# Patient Record
Sex: Male | Born: 1935 | Race: White | Hispanic: No | Marital: Married
Health system: Southern US, Community
[De-identification: ages and names within clinical notes are randomized; demographics above are authoritative.]

## PROBLEM LIST (undated history)

## (undated) DIAGNOSIS — I10 Essential (primary) hypertension: Secondary | ICD-10-CM

## (undated) DIAGNOSIS — E559 Vitamin D deficiency, unspecified: Secondary | ICD-10-CM

## (undated) DIAGNOSIS — Z8673 Personal history of transient ischemic attack (TIA), and cerebral infarction without residual deficits: Secondary | ICD-10-CM

## (undated) DIAGNOSIS — A419 Sepsis, unspecified organism: Secondary | ICD-10-CM

## (undated) DIAGNOSIS — J449 Chronic obstructive pulmonary disease, unspecified: Secondary | ICD-10-CM

## (undated) DIAGNOSIS — R7303 Prediabetes: Secondary | ICD-10-CM

## (undated) DIAGNOSIS — M1990 Unspecified osteoarthritis, unspecified site: Secondary | ICD-10-CM

## (undated) DIAGNOSIS — I4891 Unspecified atrial fibrillation: Secondary | ICD-10-CM

## (undated) DIAGNOSIS — I700 Atherosclerosis of aorta: Secondary | ICD-10-CM

## (undated) DIAGNOSIS — M8580 Other specified disorders of bone density and structure, unspecified site: Secondary | ICD-10-CM

## (undated) DIAGNOSIS — I495 Sick sinus syndrome: Secondary | ICD-10-CM

## (undated) DIAGNOSIS — G2581 Restless legs syndrome: Secondary | ICD-10-CM

## (undated) DIAGNOSIS — Z950 Presence of cardiac pacemaker: Secondary | ICD-10-CM

## (undated) DIAGNOSIS — E039 Hypothyroidism, unspecified: Secondary | ICD-10-CM

## (undated) DIAGNOSIS — G4733 Obstructive sleep apnea (adult) (pediatric): Secondary | ICD-10-CM

## (undated) DIAGNOSIS — E785 Hyperlipidemia, unspecified: Secondary | ICD-10-CM

## (undated) DIAGNOSIS — C44310 Basal cell carcinoma of skin of unspecified parts of face: Secondary | ICD-10-CM

## (undated) DIAGNOSIS — J189 Pneumonia, unspecified organism: Secondary | ICD-10-CM

## (undated) DIAGNOSIS — Z9989 Dependence on other enabling machines and devices: Secondary | ICD-10-CM

## (undated) DIAGNOSIS — J329 Chronic sinusitis, unspecified: Secondary | ICD-10-CM

## (undated) HISTORY — DX: Atherosclerosis of aorta: I70.0

## (undated) HISTORY — PX: TRANSURETHRAL RESECTION OF PROSTATE: SHX73

## (undated) HISTORY — DX: Essential (primary) hypertension: I10

## (undated) HISTORY — DX: Restless legs syndrome: G25.81

## (undated) HISTORY — DX: Other specified disorders of bone density and structure, unspecified site: M85.80

## (undated) HISTORY — DX: Sick sinus syndrome: I49.5

## (undated) HISTORY — PX: BASAL CELL CARCINOMA EXCISION: SHX1214

## (undated) HISTORY — DX: Unspecified osteoarthritis, unspecified site: M19.90

## (undated) HISTORY — DX: Prediabetes: R73.03

## (undated) HISTORY — DX: Vitamin D deficiency, unspecified: E55.9

## (undated) HISTORY — DX: Presence of cardiac pacemaker: Z95.0

## (undated) HISTORY — DX: Chronic obstructive pulmonary disease, unspecified: J44.9

## (undated) HISTORY — DX: Hyperlipidemia, unspecified: E78.5

---

## 1999-07-09 ENCOUNTER — Encounter: Payer: Self-pay | Admitting: Emergency Medicine

## 1999-07-09 ENCOUNTER — Inpatient Hospital Stay (HOSPITAL_COMMUNITY): Admission: EM | Admit: 1999-07-09 | Discharge: 1999-07-11 | Payer: Self-pay | Admitting: Emergency Medicine

## 2000-03-18 ENCOUNTER — Other Ambulatory Visit: Admission: RE | Admit: 2000-03-18 | Discharge: 2000-03-18 | Payer: Self-pay | Admitting: Urology

## 2000-11-02 ENCOUNTER — Encounter: Admission: RE | Admit: 2000-11-02 | Discharge: 2000-11-02 | Payer: Self-pay | Admitting: Urology

## 2000-11-02 ENCOUNTER — Encounter: Payer: Self-pay | Admitting: Urology

## 2000-12-10 ENCOUNTER — Encounter: Payer: Self-pay | Admitting: Urology

## 2000-12-10 ENCOUNTER — Encounter: Admission: RE | Admit: 2000-12-10 | Discharge: 2000-12-10 | Payer: Self-pay | Admitting: Urology

## 2001-03-24 ENCOUNTER — Other Ambulatory Visit: Admission: RE | Admit: 2001-03-24 | Discharge: 2001-03-24 | Payer: Self-pay | Admitting: Urology

## 2001-03-24 ENCOUNTER — Encounter (INDEPENDENT_AMBULATORY_CARE_PROVIDER_SITE_OTHER): Payer: Self-pay | Admitting: Specialist

## 2001-06-28 ENCOUNTER — Encounter: Payer: Self-pay | Admitting: Internal Medicine

## 2001-06-28 ENCOUNTER — Encounter: Admission: RE | Admit: 2001-06-28 | Discharge: 2001-06-28 | Payer: Self-pay | Admitting: Internal Medicine

## 2001-12-26 ENCOUNTER — Encounter: Admission: RE | Admit: 2001-12-26 | Discharge: 2001-12-26 | Payer: Self-pay | Admitting: Internal Medicine

## 2001-12-26 ENCOUNTER — Encounter: Payer: Self-pay | Admitting: Internal Medicine

## 2002-07-18 ENCOUNTER — Encounter: Admission: RE | Admit: 2002-07-18 | Discharge: 2002-07-18 | Payer: Self-pay | Admitting: Internal Medicine

## 2002-07-18 ENCOUNTER — Encounter: Payer: Self-pay | Admitting: Internal Medicine

## 2002-09-08 ENCOUNTER — Encounter: Admission: RE | Admit: 2002-09-08 | Discharge: 2002-09-08 | Payer: Self-pay | Admitting: Internal Medicine

## 2002-09-08 ENCOUNTER — Encounter: Payer: Self-pay | Admitting: Internal Medicine

## 2002-12-16 ENCOUNTER — Encounter: Payer: Self-pay | Admitting: Pulmonary Disease

## 2002-12-16 ENCOUNTER — Ambulatory Visit (HOSPITAL_BASED_OUTPATIENT_CLINIC_OR_DEPARTMENT_OTHER): Admission: RE | Admit: 2002-12-16 | Discharge: 2002-12-16 | Payer: Self-pay | Admitting: Internal Medicine

## 2004-02-22 HISTORY — PX: CARDIAC CATHETERIZATION: SHX172

## 2004-02-28 ENCOUNTER — Ambulatory Visit (HOSPITAL_COMMUNITY): Admission: RE | Admit: 2004-02-28 | Discharge: 2004-02-28 | Payer: Self-pay | Admitting: Cardiology

## 2004-02-28 ENCOUNTER — Encounter: Admission: RE | Admit: 2004-02-28 | Discharge: 2004-02-28 | Payer: Self-pay | Admitting: Cardiology

## 2004-02-28 ENCOUNTER — Encounter (INDEPENDENT_AMBULATORY_CARE_PROVIDER_SITE_OTHER): Payer: Self-pay | Admitting: Cardiology

## 2004-03-06 ENCOUNTER — Inpatient Hospital Stay (HOSPITAL_BASED_OUTPATIENT_CLINIC_OR_DEPARTMENT_OTHER): Admission: RE | Admit: 2004-03-06 | Discharge: 2004-03-06 | Payer: Self-pay | Admitting: Cardiology

## 2004-10-30 ENCOUNTER — Ambulatory Visit: Payer: Self-pay | Admitting: Pulmonary Disease

## 2004-12-25 ENCOUNTER — Ambulatory Visit: Payer: Self-pay | Admitting: Pulmonary Disease

## 2005-04-09 ENCOUNTER — Ambulatory Visit: Payer: Self-pay | Admitting: Pulmonary Disease

## 2005-04-23 HISTORY — PX: INSERT / REPLACE / REMOVE PACEMAKER: SUR710

## 2005-05-16 ENCOUNTER — Ambulatory Visit: Payer: Self-pay | Admitting: Physical Medicine & Rehabilitation

## 2005-05-16 ENCOUNTER — Inpatient Hospital Stay (HOSPITAL_COMMUNITY): Admission: EM | Admit: 2005-05-16 | Discharge: 2005-05-24 | Payer: Self-pay | Admitting: Emergency Medicine

## 2005-05-18 ENCOUNTER — Encounter (INDEPENDENT_AMBULATORY_CARE_PROVIDER_SITE_OTHER): Payer: Self-pay | Admitting: Cardiology

## 2005-05-22 ENCOUNTER — Ambulatory Visit: Payer: Self-pay | Admitting: Internal Medicine

## 2005-05-28 ENCOUNTER — Ambulatory Visit: Payer: Self-pay | Admitting: Internal Medicine

## 2005-06-10 ENCOUNTER — Encounter: Admission: RE | Admit: 2005-06-10 | Discharge: 2005-08-12 | Payer: Self-pay | Admitting: Neurology

## 2005-08-17 ENCOUNTER — Ambulatory Visit: Payer: Self-pay | Admitting: Pulmonary Disease

## 2005-09-11 ENCOUNTER — Ambulatory Visit: Payer: Self-pay | Admitting: Internal Medicine

## 2005-11-18 ENCOUNTER — Ambulatory Visit: Payer: Self-pay | Admitting: Pulmonary Disease

## 2006-03-16 ENCOUNTER — Encounter: Admission: RE | Admit: 2006-03-16 | Discharge: 2006-03-16 | Payer: Self-pay | Admitting: Cardiology

## 2006-04-29 ENCOUNTER — Ambulatory Visit: Payer: Self-pay

## 2006-05-17 ENCOUNTER — Ambulatory Visit: Payer: Self-pay | Admitting: Pulmonary Disease

## 2006-05-18 ENCOUNTER — Ambulatory Visit (HOSPITAL_COMMUNITY): Admission: RE | Admit: 2006-05-18 | Discharge: 2006-05-18 | Payer: Self-pay | Admitting: Neurology

## 2006-05-18 ENCOUNTER — Encounter (INDEPENDENT_AMBULATORY_CARE_PROVIDER_SITE_OTHER): Payer: Self-pay | Admitting: Neurology

## 2006-08-13 ENCOUNTER — Ambulatory Visit: Payer: Self-pay | Admitting: Pulmonary Disease

## 2006-12-09 ENCOUNTER — Ambulatory Visit: Payer: Self-pay | Admitting: Pulmonary Disease

## 2007-04-05 ENCOUNTER — Ambulatory Visit: Payer: Self-pay | Admitting: Pulmonary Disease

## 2007-09-13 ENCOUNTER — Encounter: Admission: RE | Admit: 2007-09-13 | Discharge: 2007-09-13 | Payer: Self-pay | Admitting: Cardiology

## 2008-02-22 HISTORY — PX: SHOULDER OPEN ROTATOR CUFF REPAIR: SHX2407

## 2008-02-23 ENCOUNTER — Encounter (INDEPENDENT_AMBULATORY_CARE_PROVIDER_SITE_OTHER): Payer: Self-pay | Admitting: Orthopaedic Surgery

## 2008-02-23 ENCOUNTER — Ambulatory Visit (HOSPITAL_COMMUNITY): Admission: RE | Admit: 2008-02-23 | Discharge: 2008-02-24 | Payer: Self-pay | Admitting: Orthopaedic Surgery

## 2008-11-23 DIAGNOSIS — I700 Atherosclerosis of aorta: Secondary | ICD-10-CM

## 2008-11-23 HISTORY — DX: Atherosclerosis of aorta: I70.0

## 2008-11-26 ENCOUNTER — Encounter: Admission: RE | Admit: 2008-11-26 | Discharge: 2008-11-26 | Payer: Self-pay | Admitting: Neurosurgery

## 2008-12-04 ENCOUNTER — Emergency Department (HOSPITAL_COMMUNITY): Admission: EM | Admit: 2008-12-04 | Discharge: 2008-12-05 | Payer: Self-pay | Admitting: Emergency Medicine

## 2008-12-20 DIAGNOSIS — N320 Bladder-neck obstruction: Secondary | ICD-10-CM

## 2008-12-21 ENCOUNTER — Encounter (INDEPENDENT_AMBULATORY_CARE_PROVIDER_SITE_OTHER): Payer: Self-pay | Admitting: *Deleted

## 2008-12-21 ENCOUNTER — Ambulatory Visit: Payer: Self-pay | Admitting: Gastroenterology

## 2008-12-21 DIAGNOSIS — I480 Paroxysmal atrial fibrillation: Secondary | ICD-10-CM

## 2009-01-07 ENCOUNTER — Telehealth: Payer: Self-pay | Admitting: Gastroenterology

## 2009-01-15 ENCOUNTER — Ambulatory Visit: Payer: Self-pay | Admitting: Gastroenterology

## 2010-05-08 ENCOUNTER — Ambulatory Visit (HOSPITAL_COMMUNITY): Admission: RE | Admit: 2010-05-08 | Discharge: 2010-05-08 | Payer: Self-pay | Admitting: Internal Medicine

## 2010-06-09 ENCOUNTER — Ambulatory Visit (HOSPITAL_COMMUNITY): Admission: RE | Admit: 2010-06-09 | Discharge: 2010-06-09 | Payer: Self-pay | Admitting: Internal Medicine

## 2010-09-05 ENCOUNTER — Ambulatory Visit: Payer: Self-pay | Admitting: Pulmonary Disease

## 2010-09-05 ENCOUNTER — Encounter: Payer: Self-pay | Admitting: Pulmonary Disease

## 2010-09-05 DIAGNOSIS — Z8673 Personal history of transient ischemic attack (TIA), and cerebral infarction without residual deficits: Secondary | ICD-10-CM

## 2010-09-11 ENCOUNTER — Telehealth (INDEPENDENT_AMBULATORY_CARE_PROVIDER_SITE_OTHER): Payer: Self-pay | Admitting: *Deleted

## 2010-12-14 ENCOUNTER — Encounter: Payer: Self-pay | Admitting: Neurosurgery

## 2010-12-25 NOTE — Progress Notes (Signed)
Summary: returning call---cxr results  Phone Note Call from Patient Call back at Home Phone [phone]   Caller: Spouse//hilda Call For: clance Summary of Call: Returning Mindy's call. Initial call taken by: Darletta Moll,  September 11, 2010 1:31 PM  Follow-up for Phone Call        called and spoke with pt's spouse.  spouse aware of cxr results and will relay message to pt.  Aundra Millet Reynolds LPN  September 11, 2010 2:35 PM

## 2010-12-25 NOTE — Assessment & Plan Note (Signed)
Summary: self referral for chronic cough   Copy to:  self referral Primary Provider/Referring Provider:  Marisue Brooklyn MD  CC:  Pt was former pt of laura gonzalas. Pt states he has a productive cough white phlem and he gets a lot of it. pt states he can feel the mucus run down his throat bc it is so much. .  History of Present Illness: The pt is a 77y/o male who comes in today as a self referral for evaluation of chronic cough.  The pt states that he has been coughing ALL of his life, and he has been given the diagnosis of "asthma" in the past (although there are no records to indicate how this was determined).  The pt states the cough is primarily dry, though at times may produce 1/2 teasp. of white mucus.  The cough is intermittant throughout the day.  He admits to having some postnasal drip, and can feel a tickle in the back of his throat.  He also has GERD on occasions.  The pt states that his breathing is not at his usual baseline, but has hurt his back and has done no significant activity since.  The pt has not had a recent cxr or spirometry.  Current Medications (verified): 1)  Coumadin 6 Mg Tabs (Warfarin Sodium) .Marland Kitchen.. 1 By Mouth Once Daily 2)  Actonel 35 Mg Tabs (Risedronate Sodium) .... Q Week 3)  Flomax 0.4 Mg Xr24h-Cap (Tamsulosin Hcl) .Marland Kitchen.. 1 By Mouth Once Daily 4)  Namenda 10 Mg Tabs (Memantine Hcl) .Marland Kitchen.. 1 Two Times A Day 5)  Multivitamins   Tabs (Multiple Vitamin) .Marland Kitchen.. 1 By Mouth Once Daily 6)  Calcium 1200 1200-1000 Mg-Unit Chew (Calcium Carbonate-Vit D-Min) .... One Tablet Two Times A Day 7)  Vitamin B-12 1000 Mcg Tabs (Cyanocobalamin) .... Once Daily 8)  Acetaminophen 325 Mg  Tabs (Acetaminophen) .... As Needed 9)  Clonazepam 0.5 Mg Tabs (Clonazepam) .... Once Daily 10)  Iosat 130 Mg Tabs (Potassium Iodide (Antidote)) .... Once Daily 11)  Magnesium 500 Mg Tabs (Magnesium) .... 2 Tablets Once Daily 12)  Fenofibrate Micronized 134 Mg Caps (Fenofibrate Micronized) .... Once  Daily 13)  Flax Seed Oil 1000 Mg Caps (Flaxseed (Linseed)) .... Once Daily 14)  Cod Liver Oil  Caps (Cod Liver Oil) .... Once Daily  Allergies (verified): No Known Drug Allergies  Past History:  Past Medical History: Anemia Hyperlipidemia Sleep Apnea BENIGN PROSTATIC HYPERTROPHY, HX OF (ICD-V13.8) pacemaker placement Atrial Fibrillation C V A / Stroke  Past Surgical History: Pacemaker 2006 Rotator Cuff Repair spine surgery of unknown type.  Family History: Reviewed history from 12/21/2008 and no changes required. Heart disease-- brother, sister  Social History: Reviewed history from 12/21/2008 and no changes required. 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  Review of Systems       The patient complains of shortness of breath with activity, productive cough, irregular heartbeats, nasal congestion/difficulty breathing through nose, sneezing, anxiety, and change in color of mucus.  The patient denies shortness of breath at rest, non-productive cough, coughing up blood, chest pain, acid heartburn, indigestion, loss of appetite, weight change, abdominal pain, difficulty swallowing, sore throat, tooth/dental problems, headaches, itching, ear ache, depression, hand/feet swelling, joint stiffness or pain, rash, and fever.    Vital Signs:  Patient profile:   77 year old male Height:      68 inches Weight:      185.13 pounds BMI:  28.25 O2 Sat:      90 % on Room air Temp:     97.8 degrees F oral Pulse rate:   80 / minute BP sitting:   110 / 68  (right arm) Cuff size:   regular  Vitals Entered By: Carver Fila (September 05, 2010 10:35 AM)  O2 Flow:  Room air CC: Pt was former pt of laura gonzalas. Pt states he has a productive cough white phlem and he gets a lot of it. pt states he can feel the mucus run down his throat bc it is so much.  Comments meds and allergies updated Phone number updated Carver Fila   September 05, 2010 10:35 AM    Physical Exam  General:  wd male in nad Eyes:  PERRLA and EOMI.   Nose:  mild narrowing, no purulence, mild erythema Mouth:  clear, no exudates or lesions. Neck:  no jvd, tmg, LN Lungs:  clear to auscultation, no wheezing or rhonchi Heart:  rrr, 2/6 sem Abdomen:  soft and nontender, bs+ Extremities:  no edema or cyanosis , pulses intact distally Neurologic:  alert and oriented, moves all 4.   Impression & Recommendations:  Problem # 1:  COUGH (ICD-786.2) the pt has a chronic cough that has been "lifetime".  From his description today, it really sounds most c/w an upper airway cough than lower.  He carries a diagnosis of "asthma", but it is unclear how this was determined, and he is on no chronic meds for this.  He does have some history to suggest postnasal drip and possibly LPR.  I would like to treat him for both of these entities and see how he responds.  If no change, will check pfts to investigate whether he may indeed have asthma.  Will also check a cxr today for completeness.  Medications Added to Medication List This Visit: 1)  Calcium 1200 1200-1000 Mg-unit Chew (Calcium carbonate-vit d-min) .... One tablet two times a day 2)  Vitamin B-12 1000 Mcg Tabs (Cyanocobalamin) .... Once daily 3)  Clonazepam 0.5 Mg Tabs (Clonazepam) .... Once daily 4)  Iosat 130 Mg Tabs (Potassium iodide (antidote)) .... Once daily 5)  Magnesium 500 Mg Tabs (Magnesium) .... 2 tablets once daily 6)  Fenofibrate Micronized 134 Mg Caps (Fenofibrate micronized) .... Once daily 7)  Flax Seed Oil 1000 Mg Caps (Flaxseed (linseed)) .... Once daily 8)  Cod Liver Oil Caps (Cod liver oil) .... Once daily  Other Orders: T-2 View CXR (71020TC) New Patient Level IV (16109)  Patient Instructions: 1)  will try nasonex 2 sprays each nostril each am. 2)  chlorpheniramine 8mg  at bedtime (over the counter) 3)  dexilant 60mg  each am for possible reflux 4)  please call me in 2 weeks  with how things are going.    Appended Document: Orders Update    Clinical Lists Changes  Orders: Added new Service order of Spirometry w/Graph (94010) - Signed      Appended Document: self referral for chronic cough Went ahead and did spirometry...normal..Marland KitchenI think it is very unlikely pt really has asthma

## 2011-04-07 NOTE — Discharge Summary (Signed)
NAMENEILSON, OEHLERT NO.:  [id number]   MEDICAL RECORD NO.:  [id number]          PATIENT TYPE:  OIB   LOCATION:  5001                         FACILITY:  [facility]   PHYSICIAN:  Lubertha Basque. Dalldorf, M.D.DATE OF BIRTH:  Oct 12, 1930   DATE OF ADMISSION:  02/23/2008  DATE OF DISCHARGE:  02/24/2008                               DISCHARGE SUMMARY   ADMITTING DIAGNOSIS:  Right shoulder impingement, rotator cuff tear.   DISCHARGE DIAGNOSES:  1. Right shoulder impingement, rotator cuff tear.  2. Difficulty voiding.   BRIEF HISTORY:  Mr. Mangel is a patient well known to our practice.  He  has had increasing shoulder discomfort, pain with impingement maneuver,  i.e., secondary impingement and pain with resisted rotator cuff  strength.  His rotator cuff is weak.  He has had a pacemaker placed  recently, so we have not done an MRI scan, but his regular x-ray showed  type IIIA acromion.  We discussed treatment options with him, that being  shoulder scope and probable debridement of the rotator cuff and the  removal of spurs.   PERTINENT LABORATORY AND X-RAY FINDINGS:  CBC:  Hemoglobin is 14.1, WBC  6.4.  Sodium 139, potassium 4.9, BUN 11, and creatinine 0.96.   COURSE IN THE HOSPITAL:  He was admitted postoperatively, placed on  p.r.n. analgesics for pain, in-and-out catheter was performed as  necessary. The first day of postop, his vital signs were benign.  No  sign of infection or irritation.  His shoulder dressing was changed.  The wound was noted to be benign.  No difficulties.  Lungs were clear.  Abdomen was soft.  He still had some difficulty voiding and would be  able to be discharged if he could void on his own.   CONDITION ON DISCHARGE:  Improved.   FOLLOWUP:  He will remain on his home medicines which are Advair Diskus,  Namenda 10 mg, Coumadin, multivitamin, Vytorin, and Actonel.  He may  change his dressing daily.  Any sign of infection, he is to call our  office.  He is given a prescription for Percocet for pain.  Restarted  his Coumadin yesterday.  Low-sodium, heart-healthy diet.  May be in a  sling, and then return to the office in 7-10 days.      Lindwood Qua, P.A.      Lubertha Basque Jerl Santos, M.D.  Electronically Signed   MC/MEDQ  D:  02/24/2008  T:  02/25/2008  Job:  [id number]

## 2011-04-07 NOTE — Op Note (Signed)
NAME:  BISHOP, VANDERWERF              ACCOUNT NO.:  [id number]   MEDICAL RECORD NO.:  [id number]          PATIENT TYPE:  AMB   LOCATION:  SDS                          FACILITY:  [facility]   PHYSICIAN:  Lubertha Basque. Dalldorf, M.D.DATE OF BIRTH:  22-Jun-1930   DATE OF PROCEDURE:  02/23/2008  DATE OF DISCHARGE:                               OPERATIVE REPORT   PREOPERATIVE DIAGNOSES:  1. Right shoulder impingement.  2. Right shoulder rotator cuff tear.  3. Right shoulder subcutaneous mass.   POSTOPERATIVE DIAGNOSIS:  1. Right shoulder impingement.  2. Right shoulder rotator cuff tear.  3. Right shoulder subcutaneous mass.   PROCEDURE:  1. Right shoulder arthroscopic acromioplasty.  2. Right shoulder arthroscopic debridement.  3. Right shoulder arthroscopic partial clavicectomy.  4. Right shoulder open mass excision.   ANESTHESIA:  General and block.   ATTENDING SURGEON:  Lubertha Basque. Jerl Santos, M.D.   ASSISTANT:  Lindwood Qua, P.A.   INDICATIONS FOR PROCEDURE:  The patient is a 77 year old man with about  30 years of a mass on the shoulder which has not changed significantly.  Unfortunately for several months he has had intense pain in the shoulder  which has not responded to an injection or activity modification.  He  has pain which limits ability use his arm and rest.  He cannot undergo  an MRI scan due to his concomitant pacemaker.  At this point he is  offered an arthroscopy with open excision of the mass.  Informed  operative consent was obtained after discussion of possible cmplications  of reaction to anesthesia and infection.   SUMMARY OF FINDINGS AND PROCEDURE:  Under general anesthesia,an  arthroscopy of the right shoulder was performed.  Glenohumeral joint  showed minimal degenerative change.  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  He  did have a rotator cuff tear fairly anterior, partial-thickness though  this did  consist of about 70% the thickness of the cuff.  He had a very  tight subacromial space.  This was addressed with acromioplasty back to  a flat surface along with a partial AC resection.  I elected not to  repair this partially torn rotator cuff in this 77 year old man.  We  simply debrided back the rough edge and decompressed.  We then performed  an open excision of what appeared to be a lipoma in the area of the [facility]  joint.  This measured about 10 x 6 x 4 cm and was sent to Pathology in  formalin.   DESCRIPTION OF PROCEDURE:  The patient was taken to an operating suite  where general anesthetic applied without difficulty.  He was also given  a block in the preanesthesia area.  He was positioned in beach-chair  position and prepped and draped in normal sterile fashion.  After the  administration of IV Kefzol, an arthroscopy of the right shoulder was  performed through a total of three portals.  Findings were as noted  above. Procedure consisted of debridement of the biceps stump followed  by the acromioplasty done with a bur  in the lateral position followed by  transfer of bur to the posterior position.  Also excised the  undersurface the distal clavicle.  He did have a cuff tear that went  above and this was debrided but not formally repaired.  We then removed  the arthroscopic equipment.  I made an incision over the mass with  dissection down to a fatty tumor.  This measured as above and was  loosely adherent to underlying fascial structures.  This was excised  completely and sent to pathology in formalin.  The wound was irrigated  followed by reapproximation of subcutaneous tissues with 2-0 undyed  Vicryl and skin with nylon.  Adaptic was applied followed by dry gauze  and tape.  Estimated blood loss and intraoperative fluids can be  obtained from anesthesia records.   DISPOSITION:  The patient was extubated in the operating room and taken  to recovery in stable addition.  He was to  go home same-day and follow  up in the office in less than a week.  He will resume his Coumadin  immediately.  I will contact him by phone tonight.      Lubertha Basque Jerl Santos, M.D.  Electronically Signed     PGD/MEDQ  D:  02/23/2008  T:  02/23/2008  Job:  [id number]

## 2011-04-07 NOTE — Assessment & Plan Note (Signed)
[facility]                             PULMONARY OFFICE NOTE   NAME:Hottle, ESSEX PERRY                     MRN:          [id number]  DATE:04/05/2007                            DOB:          [birth year]    This is a very pleasant 77 year old gentleman who follows here for  obstructive sleep apnea. He is currently on CPAP at 8 cm of water  pressure, sleeping well with this and compliant with this. No issues  with daytime somnolence. He has also asthmatic bronchitis and rhinitis.  The patient also has periodic limb movement disorder of sleep, which has  failed Mirapex therapy in the past. Is well-maintained on Klonopin at  0.5 mg at bedtime. On his periodic limb movement disorder he stated that  he had failed Mirapex. Had been maintained on Klonopin 0.5 mg at  bedtime, but has since January discontinued this medication on his own  and is doing well according to his wife. The patient presents today  having run out of his nasal spray for his chronic rhinitis. He had been  on Veramyst. He notes increased nasal congestion and also post-nasal  drip. In turn, he has developed congested cough which is productive of  whitish sputum. This not an occurrence as his nasal congestion is under  control. He denies any fevers, chills or sweats. He voices no other  complaints.   CURRENT MEDICATIONS:  As noted on the intake sheet. These have been  reviewed and are accurate.   PHYSICAL EXAMINATION:  VITAL SIGNS: As noted. Oxygen saturation is 94%  on room air.  GENERAL: This is a well-developed, well-nourished gentleman who is in no  acute distress.  HEENT: Examination was remarkable for nasal turbinate edema bilaterally.  Pharynx is clear.  NECK: Supple. No adenopathy noted. No JVD.  LUNGS:  Remarkably clear. He is moving air very well.  CARDIAC: Regular rate and rhythm. No rubs, murmurs or gallops.  LOWER EXTREMITIES: The patient has no cyanosis, no clubbing or  edema  noted.   IMPRESSION:  1. Obstructive sleep apnea. The patient is well-compensated in this      regard. He is maintained on CPAP at 8-cm of water pressure.  2. Post-nasal drip, allergic rhinitis and cough secondary to same.  3. Asthmatic bronchitis. Currently fairly well-compensated.  4. Periodic limb movement disorder with no recurrence with sleep apnea      control.   PLAN:  1. Will be for the patient to continue continuous positive airway      pressure (CPAP) at 8 cm of water pressure.  2. Post-nasal drip and allergic rhinitis: Will place him on Omnaris      two inhalations to each nostril.  3. Asthmatic bronchitis. Continue Advair 250/50 one inhalation twice a      day.  4. Followup will be in three months time with Dr.  Coralyn Helling. He is      to contact us prior to that      time should any new problems arise. The patient was informed today      about my leaving the practice.  Gailen Shelter, MD  Electronically Signed    CLG/MedQ  DD: 04/05/2007  DT: 04/05/2007  Job #: [id number]

## 2011-04-10 NOTE — Consult Note (Signed)
NAMECORDERA, STINEMAN NO.:  [id number]   MEDICAL RECORD NO.:  [id number]          PATIENT TYPE:  INP   LOCATION:  3113                         FACILITY:  [facility]   PHYSICIAN:  Melvyn Novas, M.D.  DATE OF BIRTH:  02/29/1930   DATE OF CONSULTATION:  05/16/2005  DATE OF DISCHARGE:                                   CONSULTATION   NEUROLOGY CODE STROKE:  Paul Davies presents on May 16, 2005, to the [facility] ER by EMS.  He was last seen at 11:45 this morning on his normal level  of functioning but was found by his wife perhaps 80 or 90 minutes after that  time in the garage, left sided weakness was evident and he could not clearly  verbalize to her what happened.  Paul Davies was very upset and tried to  reach multiple family members and neighbors to help her get her husband up.  When she called finally 911, it was more than 40 minutes after she found her  husband.  The patient has a past medical history of mild dementia and was  placed on Namenda.  He has hypercholesterolemia, hypertension and had a  stroke 6 years ago that affected the opposite body side.  He is followed by  Paul Davies, Neurology, in [place].   PHYSICAL EXAMINATION:  The patient has a heart rate of 52, regular.  He has  no carotid bruit, no murmur.  His peripheral extremities show no edema,  bruising or cyanosis.  He was working in his yard and arrived in the ER with  soil on his hands and knees.  His respiratory rate was 18, lungs clear to  auscultation.  He has been alert.  He is following simple commands, but he  initially appeared nonverbal.  Paul Davies is slender built and it was  evident that he tried to move his left side but could not.  He had a gaze  preference to the right.  He did not respond to left-sided visual threat,  left visual droop was evident, left arm weakness was seen, the arm appeared  flaccid at the initial evaluation.  Left leg was 4/5 antigravity and the  right leg  was 5/5.  The patient had bilateral upgoing toes in the initial  evaluation at 2:45 p.m.  Sensory appeared to be impaired on the left side.  He was not responding to left-sided stimuli as easy as he responded to  right.  He also showed sign of extinction when bilaterally simultaneously  stimulated.  Within less than 20 minutes, the patient was able to move his  left arm to a mild degree, but he still had no grip strength in the left  hand.  I contacted the __________ stroke protocol on the [facility] team and  Dr. Melbourne Abts is the stroke doctor for the weekend.  The patient's family  agreed to have the patient enrolled in a study and the patient qualified for  the [place] study.  His NIH stroke scale at 3:15 p.m. was 8 points.  The  patient was admitted to the 3100 floor  for observation.  Will have a repeat  CT tomorrow.  We also ordered MRI with MRA, 2D echocardiogram, PT, OT and  speech evaluations.  The patient passed a bedside swallowing test and will  be able to eat a regular diet.   As to social history, the patient denied alcohol or nicotine use and said  that he is retired, works mostly around the yard.  His wife, who has a lot  of trouble with arthritis, is not physically able to do this kind of home  work.  He and  his wife live alone since their 5 children have all grown and left the  parental household.  His wife stated that he has a strong family history of  Alzheimer's type dementia and that Namenda was started after enrollment in a  memory study in [place].       CD/MEDQ  D:  05/17/2005  T:  05/17/2005  Job:  [id number]   cc:   Jean Davies, Dr.  Marcy Panning, [place]   Pramod P. Pearlean Brownie, MD  Fax: [phone]

## 2011-04-10 NOTE — Consult Note (Signed)
Paul Davies, Paul NO.:  [id number]   MEDICAL RECORD NO.:  [id number]          PATIENT TYPE:  INP   LOCATION:  3025                         FACILITY:  [facility]   PHYSICIAN:  W. Ashley Royalty., M.D.DATE OF BIRTH:  1929/12/01   DATE OF CONSULTATION:  05/19/2005  DATE OF DISCHARGE:                                   CONSULTATION   Thank you for asking me to see this 77 year old male for evaluation of  previous stroke and atrial fibrillation.  The patient has a previous history  of possible sick sinus syndrome and paroxysmal atrial fibrillation.  He has  been seen by me for some time with episodic dizziness and has had previous  occasional sinus bradycardia.  He has worn a cardiac event monitor on two  occasions.  He has been able to exercise regularly but does have some  element of dementia.  He had a Cardiolite stress test which showed atrial  fibrillation with exercise and some mild possible ischemia.  Cardiac  catheterization, which was done one year ago, did not show any significant  coronary artery disease, however.  An echocardiogram at that time was also  fairly unremarkable.  He has continued to have episodic dizziness but has  not had any frank syncope.  He has had some paroxysmal atrial fibrillation  but has not been on Coumadin but has been on aspirin.  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  He was admitted with a right  middle cerebral artery stroke and has been part of a research protocol using  a thrombolytic agent called Ancrod which is a thrombin-like inhibitor.  He  has made a reasonable recovery from this and has had only mild dysarthria.   An echocardiogram done earlier today showed no source of embolus and had  mild ventricular hypertrophy present.  He was noted to be in atrial  fibrillation at the time of the monitor but is currently in sinus rhythm.  I  was asked to comment on the need for a TEE as well as  possible  anticoagulation.   PAST MEDICAL HISTORY:  1.  Hyperlipidemia.  2.  She had a prior stroke several years ago.   No definite history of hypertension.   PAST SURGICAL HISTORY:  None.   ALLERGIES:  LIPITOR causes intolerance.   MEDICATIONS PRIOR TO ADMISSION:  Actonel, aspirin, Caltrate, Flonase,  multivitamins, Namenda, Toprol, vitamin B12, and Vitorin.   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  He is a  nonsmoker and does not use alcohol to excess.  He is quite active in his  church.   FAMILY HISTORY:  Unremarkable and is reviewed in the old records.   REVIEW OF SYSTEMS:  He generally feels well and has been able to walk on his  farm with only mild dizziness.  He has a prior history of skin cancers.  He  has occasional sinusitis.  He also has a chronic cough and has a history of  sleep apnea for which he wears CPAP.  He has no significant angina.  He has  abdominal dyspepsia and has been benign prostatic hypertrophy.  He has  generalize arthritis.  He has mild dementia noted previously and has a know  history of B12 deficiency that is treated with B12 injections.  Other than  is noted above, the remainder of the Review of Systems is unremarkable.   PHYSICAL EXAMINATION:  GENERAL:  He is a pleasant male who is currently in  no acute distress.  VITAL SIGNS:  His blood pressure is currently 110/70 sitting and 114/72  standing.  Pulse was 50 and regular.  SKIN:  Warm and dry with a large lipoma on his right shoulder.  HEAD:  He has a normocephalic balding male hair pattern.  ENT:  No gross abnormalities, and dentition is good.  NECK:  Supple without masses, JVD, thyromegaly, or bruits.  LUNGS: Clear to auscultation and percussion.  CARDIOVASCULAR:  Normal S1 and S2.  There was no S3, S4, murmur, rub, or  gallop.  Femoral and distal pulses are 2+ on the left. Dorsalis pedis pulse  absent on the right.  Posterior tibial pulses are 2+ on the right.   EXTREMITIES:  No deformity, cyanosis, or clubbing.  NEUROLOGIC:  Examination shows the presence of mild dysarthria.   LABORATORY DATA:  Hemoglobin 12.5, hematocrit 36.4.  His glucose was 131.  His initial cardiac enzymes were unremarkable, but there was a troponin of  0.24.  Cholesterol 145 with an HDL of 54 and an LDL cholesterol of 76.  Homocysteine level was 6.61.   MRI/MRA shows an acute infarction.   CT scan of the head shows some edema involving the right zone with no  evidence of hemorrhage.   Echocardiogram showed ventricular hypertrophy with normal LV function, mild  to moderate dilation of the right atrium.  He was in atrial fibrillation at  the time of the echocardiogram.   IMPRESSION:  1.  Paroxysmal atrial fibrillation, currently in sinus rhythm.  2.  Sick sinus syndrome with bradycardia not felt to be severe enough to      require pacemaker previously.  3.  No significant coronary artery disease or cerebrovascular disease.  4.  Hypertension.  5.  History of dementia.  6.  Recent middle cerebral artery stroke.   RECOMMENDATIONS:  The patient would be a candidate for Coumadin  anticoagulation to prevent further embolic events.  I would defer the nature  of anticoagulation to the neurologist, but with paroxysmal atrial  fibrillation, Coumadin would be best and would suggest initiating this and  allowing it to slowly drift up in light of his recent stroke.  He has been  on an investigational protocol, and this also will need to be investigated.  I do not  think that he needs to have a transesophageal echocardiogram at this time.  If he has further dizziness or severe bradyarrhythmias, we could consider  permanent pacemaker, but I do not think this is indicated at this time  either.   Thank you for asking me to see this nice man with you.       WST/MEDQ  D:  05/19/2005  T:  05/19/2005  Job:  [id number]   cc:   Wilson Singer, M.D. [address]   [place]  [place] [address]  Fax: [phone]   Pramod P. Pearlean Brownie, MD  Fax: [phone]

## 2011-04-10 NOTE — Assessment & Plan Note (Signed)
[facility]                             PULMONARY OFFICE NOTE   NAME:Paul Davies, Paul Davies                     MRN:          [id number]  DATE:12/09/2006                            DOB:          June 26, 1930    This is a very pleasant 77 year old gentleman who I see for obstructive  sleep apnea.  He also has issues with chronic rhinitis and mild  asthmatic bronchitis.  The patient has only difficulties with some  postnasal drip and chronic congestion.  He has had no difficulties with  dyspnea.  He has had mild occasional cough which is dry.  His current  medications are as noted on the intake sheet.  These have been reviewed  and are accurate.  He is on Coumadin for prior CVA.   PHYSICAL EXAMINATION:  VITAL SIGNS:  As noted.  Oxygen saturation was  93% on room air.  GENERAL:  This is a well-developed, well-nourished elderly male who is  in no acute distress.  He does have nasal quality to his speech.  HEENT:  Remarkable for edematous nasal turbinates with nasal passage  congestion.  He also has some erythema and crusting.  Pharynx is clear.  NECK:  Supple.  No adenopathy noted, no JVD.  LUNGS:  Clear to auscultation bilaterally.  CARDIAC:  Regular rate and rhythm.  No rubs, murmurs, gallops.  LOWER EXTREMITIES:  The patient has no cyanosis, no clubbing, no edema  noted.   IMPRESSION:  1. Obstructive sleep apnea.  This is well compensated on the current      CPAP.  2. Chronic rhinitis with poor control of symptoms.   PLAN:  1. Will switch the patient from Flonase to [facility] one inhalation to      each nostril daily.  2. The patient is to use nasal saline spray and Ayr saline gel to each      nostril on a p.r.n. basis.  3. Continue heated humidity for CPAP.  4. Followup will be in 4 months' time.  He is to contact us prior to      that time should any problems arise.     Gailen Shelter, MD  Electronically Signed    CLG/MedQ  DD: 12/09/2006   DT: 12/09/2006  Job #: [id number]

## 2011-04-10 NOTE — Op Note (Signed)
Paul Davies, PORTAL NO.:  [id number]   MEDICAL RECORD NO.:  [id number]          PATIENT TYPE:  INP   LOCATION:  3025                         FACILITY:  [facility]   PHYSICIAN:  Doylene Canning. Ladona Ridgel, M.D.  DATE OF BIRTH:  [birth year]   DATE OF PROCEDURE:  05/22/2005  DATE OF DISCHARGE:                                 OPERATIVE REPORT   PROCEDURE PERFORMED:  Implantation of a dual-chamber pacemaker.   INDICATIONS:  Symptomatic tachy-brady syndrome.   INTRODUCTION:  The patient is a 77 year old man who was admitted to the  hospital with a history of bradycardia, as well as tachy palpitations and  documented heart rates in the 40s and all the way up into the 100-120 range.  He has paroxysmal atrial fibrillation.  He is now referred for permanent  pacemaker insertion.   DESCRIPTION OF PROCEDURE:  After informed consent was obtained, the patient  was taken to the diagnostic EP laboratory in the fasting state. After usual  preparation and draping, intravenous fentanyl and midazolam were given for  sedation. Lidocaine 30 mL was infiltrated into the left infraclavicular  region. A 5 cm incision was carried out over this region. Electrocautery  utilized to dissect down to the fascial plane. The left subclavian vein was  then punctured x 2 and the Medtronic model 5076 58 cm active fixation pacing  lead, serial number [id number], was advanced into the right ventricle and  the Medtronic model 5076 52 cm active fixation pacing lead, serial number  [id number], was advanced into the right atrium. Mapping was carried out in  the right ventricle. At the final site, the R waves measured 15 mV and the  pacing threshold 1.1 volts at 0.5 msec. The pacing impedance was initially  1100 ohms with the lead actively fixed. The 10-volt pacing did not stimulate  the diaphragm. With the ventricular lead in satisfactory position, attention  was then turned to placement of the atrial lead. It was  placed in the right  atrial appendage where P waves measured 5 mV and the pacing threshold 0.4  volts at 0.5 msec. The atrial pacing impedance was 558 ohms once lead was  actively fixed. Again 10-volt pacing did not stimulate the diaphragm. With  both atrial and ventricular leads in satisfactory position, they were  secured to the subpectoralis fascia with a figure-of-eight silk suture. The  sewing sleeve was secured with skin silk suture. Electrocautery was then  utilized to make a subcutaneous pocket. Kanamycin irrigation was utilized to  irrigate the pocket and electrocautery utilized to assure hemostasis. The  Medtronic Kappa 900, serial number [id number], was connected to the atrial  and ventricular pacing leads and placed in the subcutaneous pocket. The  generator was secured with silk suture. Additional kanamycin was utilized to  irrigate the pocket. The incision was then closed with a layer of 2-0  Vicryl, followed by a layer of 3-0 Vicryl, followed by a layer of 4-0  Vicryl. Benzoin was painted on the skin. Steri-Strips were applied.  A  pressure dressing was placed. The patient was returned to his room in  satisfactory condition.   COMPLICATIONS:  There were no immediate procedure complications.   RESULTS:  Successful implantation of the Medtronic dual-chamber pacemaker in  a patient with symptomatic tachy-brady syndrome.       GWT/MEDQ  D:  05/22/2005  T:  05/22/2005  Job:  [id number]   cc:   Darden Palmer., M.D.  [address]  [place]  [place] [address]  Fax: [phone]   Pramod P. Pearlean Brownie, MD  Fax: [phone]

## 2011-04-10 NOTE — Discharge Summary (Signed)
Paul Davies NO.:  [id number]   MEDICAL RECORD NO.:  [id number]          PATIENT TYPE:  INP   LOCATION:  3025                         FACILITY:  [facility]   PHYSICIAN:  Marlan Palau, M.D.  DATE OF BIRTH:  [birth year]   DATE OF ADMISSION:  05/16/2005  DATE OF DISCHARGE:  05/23/2005                                 DISCHARGE SUMMARY   ADMISSION DIAGNOSES:  1.  New onset of left-sided weakness.  2.  Decreased verbal output.  3.  Probable right brain cerebrovascular infarction.   DISCHARGE DIAGNOSES:  1.  Right middle cerebral artery distribution cerebrovascular infarction.  2.  History of tachy-brady syndrome.  3.  Paroxysmal atrial fibrillation.  4.  Dyslipidemia.  5.  History of dementia.   PROCEDURES:  1.  CT scan of the head.  2.  MRI of the brain.  3.  2-D echocardiogram.  4.  Carotid study.  5.  Carotid Doppler study.  6.  Cardiac pacer placement.   COMPLICATIONS TO ABOVE PROCEDURES:  None.   HISTORY OF PRESENT ILLNESS:  Paul Davies is a 77 year old white male born  1929-12-05, with a history of mild dementia.  The patient was brought into  the hospital at this point for sudden onset of left-sided weakness,  difficulty with speech.  The patient was seen and evaluated in the emergency  room, underwent a CT scan of the brain that showed no evidence of an acute  stroke or hemorrhage, left temporal encephalomalacia was seen, chronic  ethmoid sinus disease was noted.  The patient was felt to be a candidate for  the Ancrod study and was admitted with this protocol.  The patient was set  up for MRI scan of the brain showing evidence of an infarction in the right  middle cerebral artery distribution involving the basilar ganglia area,  right temporal lobe.  MRI angiography showed an occluded branch of the right  middle cerebral artery.  The patient was also set up for a carotid Doppler  study that shows no significant internal carotid artery  stenosis on both  sides.  The patient completed the Ancrod study protocol.  The patient was  seen by cardiology, Dr. Sherryl Manges, for tachy-brady syndrome and was  eventually set up for cardiac pacer placement which was done on May 22, 2005.  The patient did well following this procedure.  The patient has been  on Coumadin 6 mg daily.  The patient is to be discharged to home on Toprol  25 mg daily, Mirapex 0.125 mg at bedtime, Vytorin 10/20 mg daily, Namenda 10  mg one b.i.d., Coumadin 6 mg daily.  The patient will need to have  prothrombin time checked in the next two to three days following discharge,  and will have outpatient physical therapy and occupational therapy.  Follow  up with Dr. Pearlean Brownie in the next four to six weeks.   PAST MEDICAL HISTORY:  1.  History of right brain stroke as above.  2.  Sinus bradycardia/tachy-brady syndrome.  3.  Atrial fibrillation.  4.  Mild dementia.  5.  Non-cardiac chest pain.  6.  Dyslipidemia.   MEDICATIONS ON ADMISSION:  1.  Aspirin 325 mg daily.  2.  Toprol 25 mg daily.  3.  Mirapex 0.125 mg at bedtime for restless leg syndrome.  4.  Vytorin 10/20 mg daily.  5.  Namenda 10 mg b.i.d.   ALLERGIES:  No known drug allergies.   SOCIAL HISTORY:  The patient does not smoke or drink.   Please refer to the history and physical dictation for social history,  family history, review of systems, and physical examination.   LABORATORY DATA:  Blood work with INR of 1.6, white count 10.4, hemoglobin  of 12.5, hematocrit of 36.4, MCV of 89.5, platelets of 157.  The patient has  sodium of 135, potassium 3.8, chloride 107, CO2 of 27, glucose of 113, BUN  of 15, creatinine of 0.9, calcium 8.6.  Total protein 6, albumin 3.6, AST  22, ALT 19, ALP 54, total bilirubin 0.9.  Again, hemoglobin A1C is 6.4.  Homocystine level is 6.61.  Lipid profile reveals a total cholesterol of  145, triglycerides of 76, HDL of 54, VLDL of 15, LDL of 76.  Drug screen was   unremarkable.  Urinalysis showed a specific gravity of 1.012, pH of 6,  otherwise unremarkable.   A 2-D echocardiogram shows ejection fraction of 55 to 65%, there was trivial  aortic valvular regurgitation, left atrium was mildly to moderately dilated.  The patient was in atrial fibrillation at the time of echo.   At the time of discharge, the patient is bright, alert, and cooperative.  At  the time of examination, is ambulatory, full visual fields.  The patient  will be set up for outpatient physical therapy and occupational therapy,  will again be seen by Dr. Pearlean Brownie on discharge.  Prothrombin time's need to be  checked in the next two to three days following discharge.       CKW/MEDQ  D:  05/23/2005  T:  05/23/2005  Job:  [id number]   cc:   Duke Salvia, M.D.   Darden Palmer., M.D.  [address]  [place]  [place] [address]  Fax: [phone]

## 2011-04-10 NOTE — Assessment & Plan Note (Signed)
[facility]                               PULMONARY OFFICE NOTE   NAME:[name], Paul Davies                     MRN:          [id number]  DATE:08/13/2006                            DOB:          1930/08/15    [name] is a very pleasant 77 year old gentleman who follows here for  obstructive sleep apnea on CPAP at 8 cm water pressure, periodic limb  movement and restless leg syndrome, and asthmatic bronchitis.  The patient  is currently on Advair for his asthmatic bronchitis and well compensated in  this regard.  As far as his restless leg syndrome is concerned, he had  difficulties with not tolerating Requip and is now back on Klonopin, and  requests refill on this medication.   The patient voices no other complaints.  Advair is maintaining him without  any difficulties respiratory wise.   CURRENT MEDICATIONS:  As noted on the intake sheet.  These have been  reviewed and are accurate.   PHYSICAL EXAMINATION:  VITAL SIGNS:  Noted.  Oxygen saturation is 93% on  room air.  GENERAL:  This is a well-developed elderly gentleman who is in no acute  distress.  HEENT:  Remarkable for mild rhinitis changes with mild turbinate edema.  NECK:  Supple.  No adenopathy noted.  No JVD.  LUNGS:  Clear to auscultation bilaterally.  He is moving air well.  CARDIAC:  Regular rate, rhythm.  No rubs, murmurs, gallops.  EXTREMITIES:  Patient has no cyanosis, no clubbing, no edema noted.   IMPRESSION:  1. Obstructive sleep apnea and restless leg syndrome.  The patient is      currently well-compensated on CPAP and use of Klonopin 0.5 mg at      bedtime.  2. Asthmatic bronchitis, well-compensated on Advair.  Plan, therefore,      will be to continue medications and CPAP as they are currently are.  3. Followup will be in 4 months' time.  He is to contact us prior to that      should any problems arise.                                   Gailen Shelter, MD   CLG/MedQ  DD:  08/13/2006  DT:  08/16/2006  Job #:  [id number]

## 2011-04-10 NOTE — Discharge Summary (Signed)
NAME:  Paul Davies, Paul Davies              ACCOUNT NO.:  [id number]   MEDICAL RECORD NO.:  [id number]          PATIENT TYPE:  INP   LOCATION:  3025                         FACILITY:  [facility]   PHYSICIAN:  Pramod P. Pearlean Brownie, MD    DATE OF BIRTH:  26-Apr-1930   DATE OF ADMISSION:  05/16/2005  DATE OF DISCHARGE:  05/24/2005                                 DISCHARGE SUMMARY   ADMISSION DIAGNOSIS:  Stroke.   PRIMARY CARE PHYSICIAN:  Wilson Singer, M.D.   DISCHARGE DIAGNOSES:  1.  Right middle cerebral artery branch infarction secondary to cardiogenic      embolism with history of paroxysmal atrial fibrillation.  2.  Status post participation in the [place] stroke study.  3.  Elective pacemaker insertion by Duke Salvia, M.D. on May 22, 2005.   [name] is a 77 year old Caucasian male who was admitted with sudden  onset of slurred speech, left facial weakness, left-sided weakness.  On the  day of admission he presented at about three hours from the onset of  symptoms and hence was not a candidate for IV thrombolysis; however, he  qualified for participation in the [place] stroke study.  The inclusion  criteria was reviewed.  He was felt to be a good candidate.  The study was  discussed with the patient's family and they agreed to participate in the  study.  The patient on examination in the emergency room was found to have  NIH stroke scale of 8 with decreased blink to left sided visual threat, left  facial droop, left arm flaccid weakness.  Left leg mild weakness and some  left sided sensory neglect.  CT scan of the head was unremarkable.  He was  admitted to the stroke service.  Subsequently an MRI scan of the brain  showed acute infarction along the right temporal lobe and extending into the  right basal ganglia. MRA showed decreased branches of the right middle  cerebral artery.  The patient was admitted on telemetry monitoring where he  demonstrated sinus bradycardia with a heart rate  going into the 40s and 50s.  This was apparently not a new finding on him.  He has previously been  evaluated by a cardiologist, Georga Hacking, M.D. and diagnosed with sick  sinus syndrome.  Elective pacemaker placement had not been planned.  Dr.  Donnie Aho was consulted during this hospital stay.  Initially, he recommended  conservative treatment but subsequently when the family pointed out that at  times his heart rate had dipped well into the 40s at home,  electrophysiologic consult was obtained from Dr. Sherryl Manges.  He discussed  the risks and benefits of pacemaker placement with the family and elective  pacemaker was placed on March 12, 2005.  The patient tolerated the procedure  well without any problems.  Subsequent stroke work-up revealed elevated  hemoglobin A1C of 6.4, normal homocysteine and lipid profile.  Carotid  ultrasound was unremarkable.  The patient was started on Coumadin for  secondary stroke prevention.  On the day of discharge, his INR was optimal  at 1.9.  The  patient was not started on bridging IV heparin due to the large  size of the stoke and risk for hemorrhage.  The patient on the day of  discharge had shown improvement and the NIH stroke scale was down to 4. He  had mild left-sided weakness and facial asymmetry.  Slurred speech had  improved and visual field deficit had also improved.  The patient was felt  to be a good candidate for rehabilitation and was transferred to  rehabilitation on May 24, 2005.  Medications at time of discharge included  Coumadin, dose to be adjusted to keep the INR 2 to 3, multivitamin,  Namenda, Toprol, Flonase, Carafate, Actonel, Vitamin B12 and Vytorin.  The  patient was advised to follow up with his primary physician, Eduardo Osier.  Sharyn Lull, M.D. __________ and with Dr. Pearlean Brownie in two months and his  cardiologist, Dr. Viann Fish as needed.           ______________________________  Sunny Schlein. Pearlean Brownie, MD     PPS/MEDQ  D:   08/24/2005  T:  08/24/2005  Job:  [id number]   cc:   Eduardo Osier. Sharyn Lull, M.D.  Fax: [phone]   W. Viann Fish, M.D.  Fax: [phone]  Email: [email or web address]

## 2011-04-10 NOTE — Consult Note (Signed)
Paul Davies, TIMBERMAN NO.:  [id number]   MEDICAL RECORD NO.:  [id number]          PATIENT TYPE:  INP   LOCATION:  3025                         FACILITY:  [facility]   PHYSICIAN:  Duke Salvia, M.D.  DATE OF BIRTH:  1930/07/24   DATE OF CONSULTATION:  05/20/2005  DATE OF DISCHARGE:                                   CONSULTATION   REASON FOR CONSULTATION:  Thank you very much for asking me to see Mr.  Davies in consultation for bradycardia.   HISTORY OF PRESENT ILLNESS:  Paul Davies is a 77 year old gentleman with a  past history of bradycardia that has been moderate but not clearly related  to symptoms. He has been evaluated in the past by Dr. Lacretia Nicks. Viann Fish  using event recorders which were  illuminating only showing some bradycardia  and some atrial fibrillation but apparently not showing clear correlations  between the two. His family, however, notes that his heart rate three or  four years ago was in the 61s; it is now mostly in the 40s and in concurrent  with his decline in heart rate. They have seen a significant decrease in his  functional capacity. This has manifested not in his ability to work for  periods of time but the duration that he is able to work and the abruptness  with which he gives out. He will often come in after lunch and have a  spell after which he goes to sleep. He does have obstructive sleep apnea.  However, some of these episodes occur while sitting and are associated with  abrupt weakness and a sense of presyncope. In addition, he has had some  episodes that have lasted only five or ten minutes while working. He has had  to sit with resolution of his symptoms and they can go on.   As noted previously, he presents to the hospital now with a stroke. He was  treated with a Ancrod trial to which there has been a wonderful recovery.   The patient's thromboembolic risk factors are notable for this stroke as  well as a prior stroke, age,  and borderline diabetes with a hemoglobin A1c  of 6.4. The patient has had recurrent episodes of chest pain. He has  undergone a catheterization on two occasions which have shown no significant  obstructive coronary disease.   PAST MEDICAL HISTORY:  Primarily as above.   SOCIAL HISTORY:  The patient is married. He has five children. He denies the  use of alcohol or cigarettes. 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   REVIEW OF SYMPTOMS:  Notable primarily as above. There is also some new  onset in modest dementia for which the patient has been treated with recent  introduction of Namenda.   MEDICATIONS:  1.  Aspirin 325 mg.  2.  Toprol 25 mg.  3.  Mirapex.  4.  Vytorin 10/20.  5.  Namenda 10 mg b.i.d.   ALLERGIES:  No known drug allergies.   PHYSICAL EXAMINATION:  GENERAL:  He is an elderly Caucasian male who is  quite conversant.  VITAL SIGNS:  Blood pressure is 108/59, pulse 44 and irregular, respirations  18 and unlabored.  HEENT:  No evidence of xanthoma.  NECK:  The neck veins were flat. The carotids were brisk and full  bilaterally without bruits. Carotid sinus massage was negative.  BACK:  Without kyphosis, scoliosis, or scattered crackles.  HEART:  Regular with a decreased S1, normal S2. No murmurs. Had some  irregularity consistent with PVCs.  ABDOMEN:  Soft with active bowel sounds without pulsation or  hepatosplenomegaly.  PULSES:  Femoral pulses were 2+. Distal pulses were intact. There was no  clubbing, cyanosis, or edema.  NEUROLOGICAL:  Grossly normal.  SKIN:  Warm and dry.   LABORATORY DATA:  Review of telemetry demonstrates heart rate in the 40s and  30s. These strips have mostly been obtained during sleeping hours but we  have waking heart rates in the 40s.   IMPRESSION:  1.  Sinus bradycardia which has been progressive by family report.  2.  Increasing exercise tolerance as described above, concurrent with #1.  3.  Atrial fibrillation,  paroxysmal.  4.  Cerebrovascular accident, questionably related to 3.  5.  Thromboembolic risk factors notable for:      1.  Prior stroke.      2.  Pulmonary embolus.      3.  Plus/minus diabetes with a hemoglobin A1c of 6.4.  6.  Recurrent noncardiac chest pain with a catheterization that is okay.  7.  Dementia, mild on Namenda.   DISPOSITION:  Mr. Feuerborn has bradycardia which is likely significant and  associated with symptoms. Apparently, he had a treadmill done over Dr. Leeroy Bock Tilley's office some time not too long ago from which he needed  assistance to get off. I wonder whether this was associated with a relative  slow heart rate or not. This would certainly be clarified if he had  chronotropic incompetence. However, not withstanding, the family is fairly  impressed that the functional deterioration over the last couple of years  corresponds to his changes in heart rate from the 70s to the 88s which  suggest a progressive sinus node problem. Certainly, his sinus node  dysfunction could be responsible for his symptoms, and in the absence of  other clarifying explanations, I think it becomes reasonable to proceed with  pacing support for this gentleman.   We will plan to review the treadmill report and then tentatively and timidly  consider pacer implantation realizing that there may be parts of his  exercise intolerance that are unrelated to his bradycardia. Thank you for  this consultation.       SCK/MEDQ  D:  05/20/2005  T:  05/21/2005  Job:  [id number]   cc:   Darden Palmer., M.D.  [address], [address]  [place]  [place] [address]  Fax: [phone]   Pramod P. Pearlean Brownie, MD  Fax: [phone]   Electrophysiology Laboratory

## 2011-04-10 NOTE — Cardiovascular Report (Signed)
NAME:  TEVAN, MARIAN                        ACCOUNT NO.:  [id number]   MEDICAL RECORD NO.:  [id number]                   PATIENT TYPE:  OIB   LOCATION:  6501                                 FACILITY:  [facility]   PHYSICIAN:  W. Ashley Royalty., M.D.         DATE OF BIRTH:  [birth year]   DATE OF PROCEDURE:  03/06/2004  DATE OF DISCHARGE:  03/06/2004                              CARDIAC CATHETERIZATION   HISTORY:  A 77 year old male with a history of syncope, possible stroke who  had a slightly depressed ejection fraction and also has paroxysmal atrial  fibrillation.   PROCEDURE:  Left heart catheterization with coronary angiogram and left  ventriculogram.   COMMENTS ABOUT PROCEDURE:  The patient tolerated the procedure well.  The  procedure was done with 4 French catheters over a wire exchange technique.  Tolerated the procedure well.   HEMODYNAMIC DATA:  1. Aorta postcontrast:  114/60.  2. LV postcontrast:  114/3-9.   ANGIOGRAPHIC DATA:   LEFT VENTRICULOGRAM:  Performed in the 30-degree RAO projection.  The aortic  valve was normal.  The mitral valve was normal.  The left ventricle appears  normal in size.  Estimated ejection fraction is approximately 50-55%.  Coronary arteries arise and distribute normally.  There is no coronary  calcification noted.  Some calcification noted in the aortic valve.  The  left main coronary artery is normal.   Left anterior descending ends on the distal anterior wall.  There is mild  tortuosity noted.  The circumflex coronary artery has a single marginal  branch and has mild tortuosity and is free of disease.   Right coronary artery:  Large dominant vessel with a large posterior  descending artery that extends around the apex.  The posterior lateral  branch is also present.   IMPRESSION:  1. Normal left ventricular function.  2. Normal coronary arteries.                                               Darden Palmer., M.D.   WST/MEDQ  D:  03/06/2004  T:  03/06/2004  Job:  [id number]   cc:   Wilson Singer, M.D.  [address]  [place]  [place] [address]  Fax: [phone]

## 2011-08-17 LAB — PROTIME-INR
INR: 1.1
Prothrombin Time: 14.6

## 2011-08-17 LAB — BASIC METABOLIC PANEL
BUN: 11
CO2: 28
Chloride: 103
GFR calc Af Amer: >60
Potassium: 4.9

## 2011-08-17 LAB — CBC
MCHC: 33.6
MCV: 92

## 2011-08-18 LAB — PROTIME-INR: INR: 1.1

## 2011-12-08 DIAGNOSIS — Z7901 Long term (current) use of anticoagulants: Secondary | ICD-10-CM | POA: Diagnosis not present

## 2011-12-08 DIAGNOSIS — E785 Hyperlipidemia, unspecified: Secondary | ICD-10-CM | POA: Diagnosis not present

## 2011-12-08 DIAGNOSIS — I951 Orthostatic hypotension: Secondary | ICD-10-CM | POA: Diagnosis not present

## 2011-12-08 DIAGNOSIS — I4891 Unspecified atrial fibrillation: Secondary | ICD-10-CM | POA: Diagnosis not present

## 2011-12-08 DIAGNOSIS — Z950 Presence of cardiac pacemaker: Secondary | ICD-10-CM | POA: Diagnosis not present

## 2011-12-09 ENCOUNTER — Other Ambulatory Visit: Payer: Self-pay | Admitting: Cardiology

## 2011-12-09 DIAGNOSIS — I10 Essential (primary) hypertension: Secondary | ICD-10-CM | POA: Diagnosis not present

## 2011-12-09 DIAGNOSIS — Z1212 Encounter for screening for malignant neoplasm of rectum: Secondary | ICD-10-CM | POA: Diagnosis not present

## 2011-12-09 DIAGNOSIS — E559 Vitamin D deficiency, unspecified: Secondary | ICD-10-CM | POA: Diagnosis not present

## 2011-12-09 DIAGNOSIS — E538 Deficiency of other specified B group vitamins: Secondary | ICD-10-CM | POA: Diagnosis not present

## 2011-12-09 DIAGNOSIS — Z79899 Other long term (current) drug therapy: Secondary | ICD-10-CM | POA: Diagnosis not present

## 2011-12-09 DIAGNOSIS — Z125 Encounter for screening for malignant neoplasm of prostate: Secondary | ICD-10-CM | POA: Diagnosis not present

## 2011-12-09 DIAGNOSIS — E782 Mixed hyperlipidemia: Secondary | ICD-10-CM | POA: Diagnosis not present

## 2011-12-16 DIAGNOSIS — I63239 Cerebral infarction due to unspecified occlusion or stenosis of unspecified carotid arteries: Secondary | ICD-10-CM | POA: Diagnosis not present

## 2011-12-23 DIAGNOSIS — L578 Other skin changes due to chronic exposure to nonionizing radiation: Secondary | ICD-10-CM | POA: Diagnosis not present

## 2011-12-23 DIAGNOSIS — Z950 Presence of cardiac pacemaker: Secondary | ICD-10-CM | POA: Diagnosis not present

## 2011-12-23 DIAGNOSIS — Z7901 Long term (current) use of anticoagulants: Secondary | ICD-10-CM | POA: Diagnosis not present

## 2011-12-23 DIAGNOSIS — E785 Hyperlipidemia, unspecified: Secondary | ICD-10-CM | POA: Diagnosis not present

## 2011-12-23 DIAGNOSIS — I951 Orthostatic hypotension: Secondary | ICD-10-CM | POA: Diagnosis not present

## 2011-12-23 DIAGNOSIS — I4891 Unspecified atrial fibrillation: Secondary | ICD-10-CM | POA: Diagnosis not present

## 2012-01-01 DIAGNOSIS — H524 Presbyopia: Secondary | ICD-10-CM | POA: Diagnosis not present

## 2012-01-01 DIAGNOSIS — H2510 Age-related nuclear cataract, unspecified eye: Secondary | ICD-10-CM | POA: Diagnosis not present

## 2012-01-01 DIAGNOSIS — I679 Cerebrovascular disease, unspecified: Secondary | ICD-10-CM | POA: Diagnosis not present

## 2012-01-13 DIAGNOSIS — I10 Essential (primary) hypertension: Secondary | ICD-10-CM | POA: Diagnosis not present

## 2012-01-13 DIAGNOSIS — E039 Hypothyroidism, unspecified: Secondary | ICD-10-CM | POA: Diagnosis not present

## 2012-01-19 DIAGNOSIS — N529 Male erectile dysfunction, unspecified: Secondary | ICD-10-CM | POA: Diagnosis not present

## 2012-01-19 DIAGNOSIS — Z7901 Long term (current) use of anticoagulants: Secondary | ICD-10-CM | POA: Diagnosis not present

## 2012-01-19 DIAGNOSIS — N401 Enlarged prostate with lower urinary tract symptoms: Secondary | ICD-10-CM | POA: Diagnosis not present

## 2012-02-02 DIAGNOSIS — E785 Hyperlipidemia, unspecified: Secondary | ICD-10-CM | POA: Diagnosis not present

## 2012-02-02 DIAGNOSIS — I4891 Unspecified atrial fibrillation: Secondary | ICD-10-CM | POA: Diagnosis not present

## 2012-02-02 DIAGNOSIS — Z7901 Long term (current) use of anticoagulants: Secondary | ICD-10-CM | POA: Diagnosis not present

## 2012-02-02 DIAGNOSIS — I951 Orthostatic hypotension: Secondary | ICD-10-CM | POA: Diagnosis not present

## 2012-02-02 DIAGNOSIS — Z950 Presence of cardiac pacemaker: Secondary | ICD-10-CM | POA: Diagnosis not present

## 2012-02-29 DIAGNOSIS — Z7901 Long term (current) use of anticoagulants: Secondary | ICD-10-CM | POA: Diagnosis not present

## 2012-02-29 DIAGNOSIS — Z950 Presence of cardiac pacemaker: Secondary | ICD-10-CM | POA: Diagnosis not present

## 2012-02-29 DIAGNOSIS — L570 Actinic keratosis: Secondary | ICD-10-CM | POA: Diagnosis not present

## 2012-02-29 DIAGNOSIS — Z85828 Personal history of other malignant neoplasm of skin: Secondary | ICD-10-CM | POA: Diagnosis not present

## 2012-02-29 DIAGNOSIS — I4891 Unspecified atrial fibrillation: Secondary | ICD-10-CM | POA: Diagnosis not present

## 2012-02-29 DIAGNOSIS — I951 Orthostatic hypotension: Secondary | ICD-10-CM | POA: Diagnosis not present

## 2012-02-29 DIAGNOSIS — E785 Hyperlipidemia, unspecified: Secondary | ICD-10-CM | POA: Diagnosis not present

## 2012-02-29 DIAGNOSIS — D485 Neoplasm of uncertain behavior of skin: Secondary | ICD-10-CM | POA: Diagnosis not present

## 2012-02-29 DIAGNOSIS — L821 Other seborrheic keratosis: Secondary | ICD-10-CM | POA: Diagnosis not present

## 2012-03-28 DIAGNOSIS — I4891 Unspecified atrial fibrillation: Secondary | ICD-10-CM | POA: Diagnosis not present

## 2012-03-28 DIAGNOSIS — Z7901 Long term (current) use of anticoagulants: Secondary | ICD-10-CM | POA: Diagnosis not present

## 2012-03-28 DIAGNOSIS — I951 Orthostatic hypotension: Secondary | ICD-10-CM | POA: Diagnosis not present

## 2012-03-28 DIAGNOSIS — Z950 Presence of cardiac pacemaker: Secondary | ICD-10-CM | POA: Diagnosis not present

## 2012-03-28 DIAGNOSIS — E039 Hypothyroidism, unspecified: Secondary | ICD-10-CM | POA: Diagnosis not present

## 2012-03-28 DIAGNOSIS — E785 Hyperlipidemia, unspecified: Secondary | ICD-10-CM | POA: Diagnosis not present

## 2012-04-13 DIAGNOSIS — E559 Vitamin D deficiency, unspecified: Secondary | ICD-10-CM | POA: Diagnosis not present

## 2012-04-13 DIAGNOSIS — E782 Mixed hyperlipidemia: Secondary | ICD-10-CM | POA: Diagnosis not present

## 2012-04-13 DIAGNOSIS — Z7901 Long term (current) use of anticoagulants: Secondary | ICD-10-CM | POA: Diagnosis not present

## 2012-04-13 DIAGNOSIS — I4891 Unspecified atrial fibrillation: Secondary | ICD-10-CM | POA: Diagnosis not present

## 2012-04-13 DIAGNOSIS — I951 Orthostatic hypotension: Secondary | ICD-10-CM | POA: Diagnosis not present

## 2012-04-13 DIAGNOSIS — Z950 Presence of cardiac pacemaker: Secondary | ICD-10-CM | POA: Diagnosis not present

## 2012-04-13 DIAGNOSIS — E039 Hypothyroidism, unspecified: Secondary | ICD-10-CM | POA: Diagnosis not present

## 2012-04-13 DIAGNOSIS — I10 Essential (primary) hypertension: Secondary | ICD-10-CM | POA: Diagnosis not present

## 2012-04-13 DIAGNOSIS — Z79899 Other long term (current) drug therapy: Secondary | ICD-10-CM | POA: Diagnosis not present

## 2012-04-13 DIAGNOSIS — E785 Hyperlipidemia, unspecified: Secondary | ICD-10-CM | POA: Diagnosis not present

## 2012-04-13 DIAGNOSIS — R7309 Other abnormal glucose: Secondary | ICD-10-CM | POA: Diagnosis not present

## 2012-04-27 DIAGNOSIS — I4891 Unspecified atrial fibrillation: Secondary | ICD-10-CM | POA: Diagnosis not present

## 2012-04-27 DIAGNOSIS — Z950 Presence of cardiac pacemaker: Secondary | ICD-10-CM | POA: Diagnosis not present

## 2012-04-27 DIAGNOSIS — E039 Hypothyroidism, unspecified: Secondary | ICD-10-CM | POA: Diagnosis not present

## 2012-04-27 DIAGNOSIS — I951 Orthostatic hypotension: Secondary | ICD-10-CM | POA: Diagnosis not present

## 2012-04-27 DIAGNOSIS — Z7901 Long term (current) use of anticoagulants: Secondary | ICD-10-CM | POA: Diagnosis not present

## 2012-04-27 DIAGNOSIS — E785 Hyperlipidemia, unspecified: Secondary | ICD-10-CM | POA: Diagnosis not present

## 2012-05-05 DIAGNOSIS — I4891 Unspecified atrial fibrillation: Secondary | ICD-10-CM | POA: Diagnosis not present

## 2012-05-05 DIAGNOSIS — Z950 Presence of cardiac pacemaker: Secondary | ICD-10-CM | POA: Diagnosis not present

## 2012-05-05 DIAGNOSIS — Z7901 Long term (current) use of anticoagulants: Secondary | ICD-10-CM | POA: Diagnosis not present

## 2012-05-05 DIAGNOSIS — I951 Orthostatic hypotension: Secondary | ICD-10-CM | POA: Diagnosis not present

## 2012-05-05 DIAGNOSIS — E039 Hypothyroidism, unspecified: Secondary | ICD-10-CM | POA: Diagnosis not present

## 2012-05-05 DIAGNOSIS — E785 Hyperlipidemia, unspecified: Secondary | ICD-10-CM | POA: Diagnosis not present

## 2012-05-11 DIAGNOSIS — Z950 Presence of cardiac pacemaker: Secondary | ICD-10-CM | POA: Diagnosis not present

## 2012-05-11 DIAGNOSIS — E039 Hypothyroidism, unspecified: Secondary | ICD-10-CM | POA: Diagnosis not present

## 2012-05-11 DIAGNOSIS — Z7901 Long term (current) use of anticoagulants: Secondary | ICD-10-CM | POA: Diagnosis not present

## 2012-05-11 DIAGNOSIS — I951 Orthostatic hypotension: Secondary | ICD-10-CM | POA: Diagnosis not present

## 2012-05-11 DIAGNOSIS — E785 Hyperlipidemia, unspecified: Secondary | ICD-10-CM | POA: Diagnosis not present

## 2012-05-11 DIAGNOSIS — I4891 Unspecified atrial fibrillation: Secondary | ICD-10-CM | POA: Diagnosis not present

## 2012-05-17 DIAGNOSIS — E039 Hypothyroidism, unspecified: Secondary | ICD-10-CM | POA: Diagnosis not present

## 2012-05-17 DIAGNOSIS — E559 Vitamin D deficiency, unspecified: Secondary | ICD-10-CM | POA: Diagnosis not present

## 2012-05-25 DIAGNOSIS — I4891 Unspecified atrial fibrillation: Secondary | ICD-10-CM | POA: Diagnosis not present

## 2012-05-25 DIAGNOSIS — E785 Hyperlipidemia, unspecified: Secondary | ICD-10-CM | POA: Diagnosis not present

## 2012-05-25 DIAGNOSIS — I951 Orthostatic hypotension: Secondary | ICD-10-CM | POA: Diagnosis not present

## 2012-05-25 DIAGNOSIS — Z950 Presence of cardiac pacemaker: Secondary | ICD-10-CM | POA: Diagnosis not present

## 2012-05-25 DIAGNOSIS — E039 Hypothyroidism, unspecified: Secondary | ICD-10-CM | POA: Diagnosis not present

## 2012-05-25 DIAGNOSIS — Z7901 Long term (current) use of anticoagulants: Secondary | ICD-10-CM | POA: Diagnosis not present

## 2012-06-08 DIAGNOSIS — E785 Hyperlipidemia, unspecified: Secondary | ICD-10-CM | POA: Diagnosis not present

## 2012-06-08 DIAGNOSIS — I951 Orthostatic hypotension: Secondary | ICD-10-CM | POA: Diagnosis not present

## 2012-06-08 DIAGNOSIS — I4891 Unspecified atrial fibrillation: Secondary | ICD-10-CM | POA: Diagnosis not present

## 2012-06-08 DIAGNOSIS — Z950 Presence of cardiac pacemaker: Secondary | ICD-10-CM | POA: Diagnosis not present

## 2012-06-08 DIAGNOSIS — E039 Hypothyroidism, unspecified: Secondary | ICD-10-CM | POA: Diagnosis not present

## 2012-06-08 DIAGNOSIS — Z7901 Long term (current) use of anticoagulants: Secondary | ICD-10-CM | POA: Diagnosis not present

## 2012-06-22 DIAGNOSIS — I951 Orthostatic hypotension: Secondary | ICD-10-CM | POA: Diagnosis not present

## 2012-06-22 DIAGNOSIS — I4891 Unspecified atrial fibrillation: Secondary | ICD-10-CM | POA: Diagnosis not present

## 2012-06-22 DIAGNOSIS — E039 Hypothyroidism, unspecified: Secondary | ICD-10-CM | POA: Diagnosis not present

## 2012-06-22 DIAGNOSIS — Z7901 Long term (current) use of anticoagulants: Secondary | ICD-10-CM | POA: Diagnosis not present

## 2012-06-22 DIAGNOSIS — E785 Hyperlipidemia, unspecified: Secondary | ICD-10-CM | POA: Diagnosis not present

## 2012-06-22 DIAGNOSIS — Z950 Presence of cardiac pacemaker: Secondary | ICD-10-CM | POA: Diagnosis not present

## 2012-07-06 DIAGNOSIS — E785 Hyperlipidemia, unspecified: Secondary | ICD-10-CM | POA: Diagnosis not present

## 2012-07-06 DIAGNOSIS — Z7901 Long term (current) use of anticoagulants: Secondary | ICD-10-CM | POA: Diagnosis not present

## 2012-07-06 DIAGNOSIS — I951 Orthostatic hypotension: Secondary | ICD-10-CM | POA: Diagnosis not present

## 2012-07-06 DIAGNOSIS — I4891 Unspecified atrial fibrillation: Secondary | ICD-10-CM | POA: Diagnosis not present

## 2012-07-06 DIAGNOSIS — Z950 Presence of cardiac pacemaker: Secondary | ICD-10-CM | POA: Diagnosis not present

## 2012-07-06 DIAGNOSIS — E039 Hypothyroidism, unspecified: Secondary | ICD-10-CM | POA: Diagnosis not present

## 2012-07-20 DIAGNOSIS — I951 Orthostatic hypotension: Secondary | ICD-10-CM | POA: Diagnosis not present

## 2012-07-20 DIAGNOSIS — Z950 Presence of cardiac pacemaker: Secondary | ICD-10-CM | POA: Diagnosis not present

## 2012-07-20 DIAGNOSIS — Z7901 Long term (current) use of anticoagulants: Secondary | ICD-10-CM | POA: Diagnosis not present

## 2012-07-20 DIAGNOSIS — E785 Hyperlipidemia, unspecified: Secondary | ICD-10-CM | POA: Diagnosis not present

## 2012-07-20 DIAGNOSIS — E039 Hypothyroidism, unspecified: Secondary | ICD-10-CM | POA: Diagnosis not present

## 2012-07-20 DIAGNOSIS — I4891 Unspecified atrial fibrillation: Secondary | ICD-10-CM | POA: Diagnosis not present

## 2012-08-03 DIAGNOSIS — I951 Orthostatic hypotension: Secondary | ICD-10-CM | POA: Diagnosis not present

## 2012-08-03 DIAGNOSIS — E039 Hypothyroidism, unspecified: Secondary | ICD-10-CM | POA: Diagnosis not present

## 2012-08-03 DIAGNOSIS — Z7901 Long term (current) use of anticoagulants: Secondary | ICD-10-CM | POA: Diagnosis not present

## 2012-08-03 DIAGNOSIS — E785 Hyperlipidemia, unspecified: Secondary | ICD-10-CM | POA: Diagnosis not present

## 2012-08-03 DIAGNOSIS — Z950 Presence of cardiac pacemaker: Secondary | ICD-10-CM | POA: Diagnosis not present

## 2012-08-03 DIAGNOSIS — I4891 Unspecified atrial fibrillation: Secondary | ICD-10-CM | POA: Diagnosis not present

## 2012-08-10 DIAGNOSIS — E782 Mixed hyperlipidemia: Secondary | ICD-10-CM | POA: Diagnosis not present

## 2012-08-10 DIAGNOSIS — E559 Vitamin D deficiency, unspecified: Secondary | ICD-10-CM | POA: Diagnosis not present

## 2012-08-10 DIAGNOSIS — R5381 Other malaise: Secondary | ICD-10-CM | POA: Diagnosis not present

## 2012-08-10 DIAGNOSIS — Z79899 Other long term (current) drug therapy: Secondary | ICD-10-CM | POA: Diagnosis not present

## 2012-08-10 DIAGNOSIS — E039 Hypothyroidism, unspecified: Secondary | ICD-10-CM | POA: Diagnosis not present

## 2012-08-10 DIAGNOSIS — R5383 Other fatigue: Secondary | ICD-10-CM | POA: Diagnosis not present

## 2012-08-10 DIAGNOSIS — E538 Deficiency of other specified B group vitamins: Secondary | ICD-10-CM | POA: Diagnosis not present

## 2012-08-10 DIAGNOSIS — I4891 Unspecified atrial fibrillation: Secondary | ICD-10-CM | POA: Diagnosis not present

## 2012-08-10 DIAGNOSIS — D649 Anemia, unspecified: Secondary | ICD-10-CM | POA: Diagnosis not present

## 2012-08-10 DIAGNOSIS — Z23 Encounter for immunization: Secondary | ICD-10-CM | POA: Diagnosis not present

## 2012-08-10 DIAGNOSIS — R7309 Other abnormal glucose: Secondary | ICD-10-CM | POA: Diagnosis not present

## 2012-08-15 DIAGNOSIS — E039 Hypothyroidism, unspecified: Secondary | ICD-10-CM | POA: Diagnosis not present

## 2012-08-15 DIAGNOSIS — E785 Hyperlipidemia, unspecified: Secondary | ICD-10-CM | POA: Diagnosis not present

## 2012-08-15 DIAGNOSIS — Z950 Presence of cardiac pacemaker: Secondary | ICD-10-CM | POA: Diagnosis not present

## 2012-08-15 DIAGNOSIS — I951 Orthostatic hypotension: Secondary | ICD-10-CM | POA: Diagnosis not present

## 2012-08-15 DIAGNOSIS — I4891 Unspecified atrial fibrillation: Secondary | ICD-10-CM | POA: Diagnosis not present

## 2012-08-15 DIAGNOSIS — Z7901 Long term (current) use of anticoagulants: Secondary | ICD-10-CM | POA: Diagnosis not present

## 2012-08-31 DIAGNOSIS — I951 Orthostatic hypotension: Secondary | ICD-10-CM | POA: Diagnosis not present

## 2012-08-31 DIAGNOSIS — Z7901 Long term (current) use of anticoagulants: Secondary | ICD-10-CM | POA: Diagnosis not present

## 2012-08-31 DIAGNOSIS — I4891 Unspecified atrial fibrillation: Secondary | ICD-10-CM | POA: Diagnosis not present

## 2012-08-31 DIAGNOSIS — E039 Hypothyroidism, unspecified: Secondary | ICD-10-CM | POA: Diagnosis not present

## 2012-08-31 DIAGNOSIS — E785 Hyperlipidemia, unspecified: Secondary | ICD-10-CM | POA: Diagnosis not present

## 2012-08-31 DIAGNOSIS — Z950 Presence of cardiac pacemaker: Secondary | ICD-10-CM | POA: Diagnosis not present

## 2012-08-31 DIAGNOSIS — Z13820 Encounter for screening for osteoporosis: Secondary | ICD-10-CM | POA: Diagnosis not present

## 2012-09-28 DIAGNOSIS — I4891 Unspecified atrial fibrillation: Secondary | ICD-10-CM | POA: Diagnosis not present

## 2012-09-28 DIAGNOSIS — Z7901 Long term (current) use of anticoagulants: Secondary | ICD-10-CM | POA: Diagnosis not present

## 2012-09-28 DIAGNOSIS — E039 Hypothyroidism, unspecified: Secondary | ICD-10-CM | POA: Diagnosis not present

## 2012-09-28 DIAGNOSIS — I951 Orthostatic hypotension: Secondary | ICD-10-CM | POA: Diagnosis not present

## 2012-09-28 DIAGNOSIS — E785 Hyperlipidemia, unspecified: Secondary | ICD-10-CM | POA: Diagnosis not present

## 2012-09-28 DIAGNOSIS — Z950 Presence of cardiac pacemaker: Secondary | ICD-10-CM | POA: Diagnosis not present

## 2012-10-26 DIAGNOSIS — Z7901 Long term (current) use of anticoagulants: Secondary | ICD-10-CM | POA: Diagnosis not present

## 2012-10-26 DIAGNOSIS — E785 Hyperlipidemia, unspecified: Secondary | ICD-10-CM | POA: Diagnosis not present

## 2012-10-26 DIAGNOSIS — E039 Hypothyroidism, unspecified: Secondary | ICD-10-CM | POA: Diagnosis not present

## 2012-10-26 DIAGNOSIS — I4891 Unspecified atrial fibrillation: Secondary | ICD-10-CM | POA: Diagnosis not present

## 2012-10-26 DIAGNOSIS — Z950 Presence of cardiac pacemaker: Secondary | ICD-10-CM | POA: Diagnosis not present

## 2012-10-26 DIAGNOSIS — I951 Orthostatic hypotension: Secondary | ICD-10-CM | POA: Diagnosis not present

## 2012-10-27 DIAGNOSIS — E039 Hypothyroidism, unspecified: Secondary | ICD-10-CM | POA: Diagnosis not present

## 2012-10-27 DIAGNOSIS — I10 Essential (primary) hypertension: Secondary | ICD-10-CM | POA: Diagnosis not present

## 2012-10-27 DIAGNOSIS — Z79899 Other long term (current) drug therapy: Secondary | ICD-10-CM | POA: Diagnosis not present

## 2012-10-27 DIAGNOSIS — J0100 Acute maxillary sinusitis, unspecified: Secondary | ICD-10-CM | POA: Diagnosis not present

## 2012-10-27 DIAGNOSIS — N3000 Acute cystitis without hematuria: Secondary | ICD-10-CM | POA: Diagnosis not present

## 2012-10-27 DIAGNOSIS — E539 Vitamin B deficiency, unspecified: Secondary | ICD-10-CM | POA: Diagnosis not present

## 2012-11-09 DIAGNOSIS — I951 Orthostatic hypotension: Secondary | ICD-10-CM | POA: Diagnosis not present

## 2012-11-09 DIAGNOSIS — I4891 Unspecified atrial fibrillation: Secondary | ICD-10-CM | POA: Diagnosis not present

## 2012-11-09 DIAGNOSIS — E785 Hyperlipidemia, unspecified: Secondary | ICD-10-CM | POA: Diagnosis not present

## 2012-11-09 DIAGNOSIS — E039 Hypothyroidism, unspecified: Secondary | ICD-10-CM | POA: Diagnosis not present

## 2012-11-09 DIAGNOSIS — Z950 Presence of cardiac pacemaker: Secondary | ICD-10-CM | POA: Diagnosis not present

## 2012-11-09 DIAGNOSIS — Z7901 Long term (current) use of anticoagulants: Secondary | ICD-10-CM | POA: Diagnosis not present

## 2012-11-14 DIAGNOSIS — E039 Hypothyroidism, unspecified: Secondary | ICD-10-CM | POA: Diagnosis not present

## 2012-11-14 DIAGNOSIS — I951 Orthostatic hypotension: Secondary | ICD-10-CM | POA: Diagnosis not present

## 2012-11-14 DIAGNOSIS — E785 Hyperlipidemia, unspecified: Secondary | ICD-10-CM | POA: Diagnosis not present

## 2012-11-14 DIAGNOSIS — I4891 Unspecified atrial fibrillation: Secondary | ICD-10-CM | POA: Diagnosis not present

## 2012-11-14 DIAGNOSIS — Z7901 Long term (current) use of anticoagulants: Secondary | ICD-10-CM | POA: Diagnosis not present

## 2012-11-14 DIAGNOSIS — Z950 Presence of cardiac pacemaker: Secondary | ICD-10-CM | POA: Diagnosis not present

## 2012-12-08 DIAGNOSIS — R7309 Other abnormal glucose: Secondary | ICD-10-CM | POA: Diagnosis not present

## 2012-12-08 DIAGNOSIS — E782 Mixed hyperlipidemia: Secondary | ICD-10-CM | POA: Diagnosis not present

## 2012-12-08 DIAGNOSIS — E559 Vitamin D deficiency, unspecified: Secondary | ICD-10-CM | POA: Diagnosis not present

## 2012-12-08 DIAGNOSIS — I10 Essential (primary) hypertension: Secondary | ICD-10-CM | POA: Diagnosis not present

## 2012-12-08 DIAGNOSIS — Z79899 Other long term (current) drug therapy: Secondary | ICD-10-CM | POA: Diagnosis not present

## 2012-12-21 DIAGNOSIS — I63239 Cerebral infarction due to unspecified occlusion or stenosis of unspecified carotid arteries: Secondary | ICD-10-CM | POA: Diagnosis not present

## 2012-12-21 DIAGNOSIS — F0390 Unspecified dementia without behavioral disturbance: Secondary | ICD-10-CM | POA: Diagnosis not present

## 2013-01-02 DIAGNOSIS — H538 Other visual disturbances: Secondary | ICD-10-CM | POA: Diagnosis not present

## 2013-01-02 DIAGNOSIS — H2510 Age-related nuclear cataract, unspecified eye: Secondary | ICD-10-CM | POA: Diagnosis not present

## 2013-01-02 DIAGNOSIS — I679 Cerebrovascular disease, unspecified: Secondary | ICD-10-CM | POA: Diagnosis not present

## 2013-01-28 ENCOUNTER — Encounter (HOSPITAL_COMMUNITY): Payer: Self-pay | Admitting: Emergency Medicine

## 2013-01-28 ENCOUNTER — Emergency Department (HOSPITAL_COMMUNITY): Payer: Medicare Other

## 2013-01-28 ENCOUNTER — Emergency Department (HOSPITAL_COMMUNITY)
Admission: EM | Admit: 2013-01-28 | Discharge: 2013-01-28 | Disposition: A | Discharge status: Home / Self Care | Payer: Medicare Other | Attending: Emergency Medicine | Admitting: Emergency Medicine

## 2013-01-28 DIAGNOSIS — Y92009 Unspecified place in unspecified non-institutional (private) residence as the place of occurrence of the external cause: Secondary | ICD-10-CM | POA: Insufficient documentation

## 2013-01-28 DIAGNOSIS — Z950 Presence of cardiac pacemaker: Secondary | ICD-10-CM | POA: Insufficient documentation

## 2013-01-28 DIAGNOSIS — S2231XA Fracture of one rib, right side, initial encounter for closed fracture: Secondary | ICD-10-CM

## 2013-01-28 DIAGNOSIS — Z79899 Other long term (current) drug therapy: Secondary | ICD-10-CM | POA: Diagnosis not present

## 2013-01-28 DIAGNOSIS — Y9389 Activity, other specified: Secondary | ICD-10-CM | POA: Insufficient documentation

## 2013-01-28 DIAGNOSIS — S2239XA Fracture of one rib, unspecified side, initial encounter for closed fracture: Secondary | ICD-10-CM | POA: Insufficient documentation

## 2013-01-28 DIAGNOSIS — Z8673 Personal history of transient ischemic attack (TIA), and cerebral infarction without residual deficits: Secondary | ICD-10-CM | POA: Diagnosis not present

## 2013-01-28 DIAGNOSIS — W108XXA Fall (on) (from) other stairs and steps, initial encounter: Secondary | ICD-10-CM | POA: Insufficient documentation

## 2013-01-28 NOTE — ED Provider Notes (Signed)
History    This chart was scribed for non-physician practitioner working with [name], MD by Frederik Pear, ED Scribe. This patient was seen in room WTR8/WTR8 and the patient's care was started at 2116.   CSN: [id number]  Arrival date & time 01/28/13  1541   First MD Initiated Contact with Patient 01/28/13 2116      Chief Complaint  Patient presents with  . Fall  . Rib Pain     (Consider location/radiation/quality/duration/timing/severity/associated sxs/prior treatment) The history is provided by the patient. No language interpreter was used.    Paul Davies is a 79 y.o. male who presents to the Emergency Department with a chief complaint of sudden onset, constant- non-radiating right rib pain that began 4 days ago after he fell 3 steps from the bottom of a ladder when it came unbalanced from against his house. His daughter reports that he actually fell yesterday. He has a h/o of 2 CVA and a pacemaker.  Past Medical History  Diagnosis Date  . Stroke     twice    Past Surgical History  Procedure Laterality Date  . Pacemaker insertion      History reviewed. No pertinent family history.  History  Substance Use Topics  . Smoking status: Never Smoker   . Smokeless tobacco: Not on file  . Alcohol Use: No      Review of Systems A complete 10 system review of systems was obtained and all systems are negative except as noted in the HPI and PMH.  Allergies  Review of patient's allergies indicates no known allergies.  Home Medications   Current Outpatient Rx  Name  Route  Sig  Dispense  Refill  . cholecalciferol (VITAMIN D) 1000 UNITS tablet   Oral   Take 1,000 Units by mouth daily.         Marland Kitchen warfarin (COUMADIN) 6 MG tablet   Oral   Take 6 mg by mouth daily.           BP 110/70  Pulse 83  Temp(Src) 98.4 F (36.9 C) (Oral)  Resp 16  SpO2 95%  Physical Exam  Nursing note and vitals reviewed. Constitutional: He is oriented to person, place, and  time. He appears well-developed and well-nourished. No distress.  HENT:  Head: Normocephalic and atraumatic.  Eyes: EOM are normal. Pupils are equal, round, and reactive to light.  Neck: Normal range of motion. Neck supple. No tracheal deviation present.  Cardiovascular: Normal rate.   Pulmonary/Chest: Effort normal. No respiratory distress.  Abdominal: Soft. He exhibits no distension.  Musculoskeletal: Normal range of motion. He exhibits tenderness. He exhibits no edema.  Tender to palpation over the right ribs.  Neurological: He is alert and oriented to person, place, and time.  Skin: Skin is warm and dry.  Psychiatric: He has a normal mood and affect. His behavior is normal.    ED Course  Procedures (including critical care time)  DIAGNOSTIC STUDIES: Oxygen Saturation is 95% on room air, adequate by my interpretation.    COORDINATION OF CARE:  21:20- Discussed planned course of treatment with the patient, including returning to the ED if he develops a fever, SOB, or difficulty breathing and controlling his pain with tylenol, who is agreeable at this time.   Labs Reviewed - No data to display Dg Chest 2 View  01/28/2013  *RADIOLOGY REPORT*  Clinical Data: Fall, right axillary chest and rib pain  CHEST - 2 VIEW  Comparison: Prior chest x-ray 09/05/2010  Findings: Stable position of left subclavian approach cardiac rhythm maintenance device.  Leads project over the right atrium and right ventricle.  Mild linear atelectasis versus scarring in the bilateral bases.  The lungs are otherwise clear.  No edema, consolidation, pleural effusion or pneumothorax.  The lungs are mildly hyperexpanded and there are stable central bronchitic changes and probable bilateral upper lobe emphysematous changes. Cardiac and mediastinal contours remain within normal limits. Small hiatal hernia.  There is an acute fracture the posterolateral aspect of the right sixth rib.  IMPRESSION:  1.  Acute minimally  displaced fracture of the posterolateral aspect of the right sixth rib.  No definite pneumothorax identified.  2.  Background changes of COPD and upper lobe emphysema.   Original Report Authenticated By: Malachy Moan, M.D.      1. Broken rib, right, closed, initial encounter       MDM  79 year old male with broken rib. This patient was seen by and discussed with Dr. Anitra Lauth, who agrees with the plan. Will discharge the patient with Tylenol for pain, and an incentive spirometer.  Patient is maintaining >95% O2 sat, no difficulty breathing, no pneumothorax.  Safe for discharge.  I personally performed the services described in this documentation, which was scribed in my presence. The recorded information has been reviewed and is accurate.         Roxy Horseman, PA-C 01/28/13 2138

## 2013-01-28 NOTE — ED Notes (Signed)
Pt states he fell off a step ladder (from about 2 feet) on Wednesday (4 days ago).  C/o right rib pain.

## 2013-01-28 NOTE — ED Provider Notes (Signed)
Medical screening examination/treatment/procedure(s) were conducted as a shared visit with non-physician practitioner(s) and myself.  I personally evaluated the patient during the encounter Patient with a mechanical fall 3 days ago with persistent pain in the right ribs. Only tender with deep breathing or laying on his right side. He is not hypoxic and is well-appearing. Plain film shows a mildly displaced rib fracture without pneumothorax. Patient is talking in complete sentences and is not tachypneic. He was given the results of the x-ray and given strict return precautions. He was given incentive spirometer and to use Tylenol as needed for pain  [name], MD 01/28/13 [phone]

## 2013-02-09 DIAGNOSIS — N529 Male erectile dysfunction, unspecified: Secondary | ICD-10-CM | POA: Diagnosis not present

## 2013-02-09 DIAGNOSIS — N401 Enlarged prostate with lower urinary tract symptoms: Secondary | ICD-10-CM | POA: Diagnosis not present

## 2013-02-14 DIAGNOSIS — I951 Orthostatic hypotension: Secondary | ICD-10-CM | POA: Diagnosis not present

## 2013-02-14 DIAGNOSIS — E785 Hyperlipidemia, unspecified: Secondary | ICD-10-CM | POA: Diagnosis not present

## 2013-02-14 DIAGNOSIS — E039 Hypothyroidism, unspecified: Secondary | ICD-10-CM | POA: Diagnosis not present

## 2013-02-14 DIAGNOSIS — Z950 Presence of cardiac pacemaker: Secondary | ICD-10-CM | POA: Diagnosis not present

## 2013-02-14 DIAGNOSIS — Z7901 Long term (current) use of anticoagulants: Secondary | ICD-10-CM | POA: Diagnosis not present

## 2013-02-14 DIAGNOSIS — I4891 Unspecified atrial fibrillation: Secondary | ICD-10-CM | POA: Diagnosis not present

## 2013-02-27 DIAGNOSIS — Z7901 Long term (current) use of anticoagulants: Secondary | ICD-10-CM | POA: Diagnosis not present

## 2013-02-27 DIAGNOSIS — I4891 Unspecified atrial fibrillation: Secondary | ICD-10-CM | POA: Diagnosis not present

## 2013-02-27 DIAGNOSIS — I951 Orthostatic hypotension: Secondary | ICD-10-CM | POA: Diagnosis not present

## 2013-02-27 DIAGNOSIS — Z950 Presence of cardiac pacemaker: Secondary | ICD-10-CM | POA: Diagnosis not present

## 2013-02-27 DIAGNOSIS — E785 Hyperlipidemia, unspecified: Secondary | ICD-10-CM | POA: Diagnosis not present

## 2013-02-27 DIAGNOSIS — E039 Hypothyroidism, unspecified: Secondary | ICD-10-CM | POA: Diagnosis not present

## 2013-03-06 DIAGNOSIS — Z85828 Personal history of other malignant neoplasm of skin: Secondary | ICD-10-CM | POA: Diagnosis not present

## 2013-03-06 DIAGNOSIS — L570 Actinic keratosis: Secondary | ICD-10-CM | POA: Diagnosis not present

## 2013-03-06 DIAGNOSIS — L821 Other seborrheic keratosis: Secondary | ICD-10-CM | POA: Diagnosis not present

## 2013-03-06 DIAGNOSIS — T148 Other injury of unspecified body region: Secondary | ICD-10-CM | POA: Diagnosis not present

## 2013-03-29 DIAGNOSIS — E039 Hypothyroidism, unspecified: Secondary | ICD-10-CM | POA: Diagnosis not present

## 2013-03-29 DIAGNOSIS — E785 Hyperlipidemia, unspecified: Secondary | ICD-10-CM | POA: Diagnosis not present

## 2013-03-29 DIAGNOSIS — Z950 Presence of cardiac pacemaker: Secondary | ICD-10-CM | POA: Diagnosis not present

## 2013-03-29 DIAGNOSIS — Z7901 Long term (current) use of anticoagulants: Secondary | ICD-10-CM | POA: Diagnosis not present

## 2013-03-29 DIAGNOSIS — I4891 Unspecified atrial fibrillation: Secondary | ICD-10-CM | POA: Diagnosis not present

## 2013-03-29 DIAGNOSIS — I951 Orthostatic hypotension: Secondary | ICD-10-CM | POA: Diagnosis not present

## 2013-04-26 DIAGNOSIS — Z950 Presence of cardiac pacemaker: Secondary | ICD-10-CM | POA: Diagnosis not present

## 2013-04-26 DIAGNOSIS — I4891 Unspecified atrial fibrillation: Secondary | ICD-10-CM | POA: Diagnosis not present

## 2013-04-26 DIAGNOSIS — Z7901 Long term (current) use of anticoagulants: Secondary | ICD-10-CM | POA: Diagnosis not present

## 2013-04-26 DIAGNOSIS — E039 Hypothyroidism, unspecified: Secondary | ICD-10-CM | POA: Diagnosis not present

## 2013-04-26 DIAGNOSIS — I951 Orthostatic hypotension: Secondary | ICD-10-CM | POA: Diagnosis not present

## 2013-04-26 DIAGNOSIS — E785 Hyperlipidemia, unspecified: Secondary | ICD-10-CM | POA: Diagnosis not present

## 2013-05-25 DIAGNOSIS — Z950 Presence of cardiac pacemaker: Secondary | ICD-10-CM | POA: Diagnosis not present

## 2013-05-25 DIAGNOSIS — I4891 Unspecified atrial fibrillation: Secondary | ICD-10-CM | POA: Diagnosis not present

## 2013-05-25 DIAGNOSIS — E039 Hypothyroidism, unspecified: Secondary | ICD-10-CM | POA: Diagnosis not present

## 2013-05-25 DIAGNOSIS — I951 Orthostatic hypotension: Secondary | ICD-10-CM | POA: Diagnosis not present

## 2013-05-25 DIAGNOSIS — E785 Hyperlipidemia, unspecified: Secondary | ICD-10-CM | POA: Diagnosis not present

## 2013-05-25 DIAGNOSIS — Z7901 Long term (current) use of anticoagulants: Secondary | ICD-10-CM | POA: Diagnosis not present

## 2013-06-07 DIAGNOSIS — E785 Hyperlipidemia, unspecified: Secondary | ICD-10-CM | POA: Diagnosis not present

## 2013-06-07 DIAGNOSIS — Z950 Presence of cardiac pacemaker: Secondary | ICD-10-CM | POA: Diagnosis not present

## 2013-06-07 DIAGNOSIS — I4891 Unspecified atrial fibrillation: Secondary | ICD-10-CM | POA: Diagnosis not present

## 2013-06-07 DIAGNOSIS — I951 Orthostatic hypotension: Secondary | ICD-10-CM | POA: Diagnosis not present

## 2013-06-07 DIAGNOSIS — Z7901 Long term (current) use of anticoagulants: Secondary | ICD-10-CM | POA: Diagnosis not present

## 2013-06-07 DIAGNOSIS — E039 Hypothyroidism, unspecified: Secondary | ICD-10-CM | POA: Diagnosis not present

## 2013-07-05 DIAGNOSIS — R7309 Other abnormal glucose: Secondary | ICD-10-CM | POA: Diagnosis not present

## 2013-07-05 DIAGNOSIS — I4891 Unspecified atrial fibrillation: Secondary | ICD-10-CM | POA: Diagnosis not present

## 2013-07-05 DIAGNOSIS — E039 Hypothyroidism, unspecified: Secondary | ICD-10-CM | POA: Diagnosis not present

## 2013-07-05 DIAGNOSIS — I10 Essential (primary) hypertension: Secondary | ICD-10-CM | POA: Diagnosis not present

## 2013-07-05 DIAGNOSIS — E785 Hyperlipidemia, unspecified: Secondary | ICD-10-CM | POA: Diagnosis not present

## 2013-07-05 DIAGNOSIS — Z79899 Other long term (current) drug therapy: Secondary | ICD-10-CM | POA: Diagnosis not present

## 2013-07-05 DIAGNOSIS — Z7901 Long term (current) use of anticoagulants: Secondary | ICD-10-CM | POA: Diagnosis not present

## 2013-07-05 DIAGNOSIS — Z950 Presence of cardiac pacemaker: Secondary | ICD-10-CM | POA: Diagnosis not present

## 2013-07-05 DIAGNOSIS — E559 Vitamin D deficiency, unspecified: Secondary | ICD-10-CM | POA: Diagnosis not present

## 2013-07-05 DIAGNOSIS — E782 Mixed hyperlipidemia: Secondary | ICD-10-CM | POA: Diagnosis not present

## 2013-07-05 DIAGNOSIS — N3000 Acute cystitis without hematuria: Secondary | ICD-10-CM | POA: Diagnosis not present

## 2013-07-05 DIAGNOSIS — I951 Orthostatic hypotension: Secondary | ICD-10-CM | POA: Diagnosis not present

## 2013-07-18 DIAGNOSIS — I4891 Unspecified atrial fibrillation: Secondary | ICD-10-CM | POA: Diagnosis not present

## 2013-07-18 DIAGNOSIS — E039 Hypothyroidism, unspecified: Secondary | ICD-10-CM | POA: Diagnosis not present

## 2013-07-18 DIAGNOSIS — I951 Orthostatic hypotension: Secondary | ICD-10-CM | POA: Diagnosis not present

## 2013-07-18 DIAGNOSIS — Z7901 Long term (current) use of anticoagulants: Secondary | ICD-10-CM | POA: Diagnosis not present

## 2013-07-18 DIAGNOSIS — E785 Hyperlipidemia, unspecified: Secondary | ICD-10-CM | POA: Diagnosis not present

## 2013-07-18 DIAGNOSIS — Z950 Presence of cardiac pacemaker: Secondary | ICD-10-CM | POA: Diagnosis not present

## 2013-08-16 DIAGNOSIS — I951 Orthostatic hypotension: Secondary | ICD-10-CM | POA: Diagnosis not present

## 2013-08-16 DIAGNOSIS — E039 Hypothyroidism, unspecified: Secondary | ICD-10-CM | POA: Diagnosis not present

## 2013-08-16 DIAGNOSIS — Z7901 Long term (current) use of anticoagulants: Secondary | ICD-10-CM | POA: Diagnosis not present

## 2013-08-16 DIAGNOSIS — E785 Hyperlipidemia, unspecified: Secondary | ICD-10-CM | POA: Diagnosis not present

## 2013-08-16 DIAGNOSIS — I4891 Unspecified atrial fibrillation: Secondary | ICD-10-CM | POA: Diagnosis not present

## 2013-08-16 DIAGNOSIS — Z950 Presence of cardiac pacemaker: Secondary | ICD-10-CM | POA: Diagnosis not present

## 2013-08-19 DIAGNOSIS — I4891 Unspecified atrial fibrillation: Secondary | ICD-10-CM | POA: Diagnosis not present

## 2013-08-19 DIAGNOSIS — I951 Orthostatic hypotension: Secondary | ICD-10-CM | POA: Diagnosis not present

## 2013-08-19 DIAGNOSIS — Z950 Presence of cardiac pacemaker: Secondary | ICD-10-CM | POA: Diagnosis not present

## 2013-08-19 DIAGNOSIS — E785 Hyperlipidemia, unspecified: Secondary | ICD-10-CM | POA: Diagnosis not present

## 2013-08-19 DIAGNOSIS — Z7901 Long term (current) use of anticoagulants: Secondary | ICD-10-CM | POA: Diagnosis not present

## 2013-08-19 DIAGNOSIS — E039 Hypothyroidism, unspecified: Secondary | ICD-10-CM | POA: Diagnosis not present

## 2013-09-13 DIAGNOSIS — E785 Hyperlipidemia, unspecified: Secondary | ICD-10-CM | POA: Diagnosis not present

## 2013-09-13 DIAGNOSIS — I951 Orthostatic hypotension: Secondary | ICD-10-CM | POA: Diagnosis not present

## 2013-09-13 DIAGNOSIS — E039 Hypothyroidism, unspecified: Secondary | ICD-10-CM | POA: Diagnosis not present

## 2013-09-13 DIAGNOSIS — I4891 Unspecified atrial fibrillation: Secondary | ICD-10-CM | POA: Diagnosis not present

## 2013-09-13 DIAGNOSIS — Z950 Presence of cardiac pacemaker: Secondary | ICD-10-CM | POA: Diagnosis not present

## 2013-09-13 DIAGNOSIS — Z7901 Long term (current) use of anticoagulants: Secondary | ICD-10-CM | POA: Diagnosis not present

## 2013-10-11 DIAGNOSIS — I4891 Unspecified atrial fibrillation: Secondary | ICD-10-CM | POA: Diagnosis not present

## 2013-10-11 DIAGNOSIS — E785 Hyperlipidemia, unspecified: Secondary | ICD-10-CM | POA: Diagnosis not present

## 2013-10-11 DIAGNOSIS — I951 Orthostatic hypotension: Secondary | ICD-10-CM | POA: Diagnosis not present

## 2013-10-11 DIAGNOSIS — Z950 Presence of cardiac pacemaker: Secondary | ICD-10-CM | POA: Diagnosis not present

## 2013-10-11 DIAGNOSIS — E039 Hypothyroidism, unspecified: Secondary | ICD-10-CM | POA: Diagnosis not present

## 2013-10-11 DIAGNOSIS — Z7901 Long term (current) use of anticoagulants: Secondary | ICD-10-CM | POA: Diagnosis not present

## 2013-10-16 ENCOUNTER — Encounter: Payer: Self-pay | Admitting: Internal Medicine

## 2013-10-17 ENCOUNTER — Ambulatory Visit: Payer: Medicare Other | Admitting: Emergency Medicine

## 2013-10-17 VITALS — BP 120/72 | HR 62 | Temp 98.2°F | Resp 16 | Ht 68.0 in | Wt 173.0 lb

## 2013-10-17 DIAGNOSIS — Z23 Encounter for immunization: Secondary | ICD-10-CM | POA: Diagnosis not present

## 2013-10-17 DIAGNOSIS — R05 Cough: Secondary | ICD-10-CM | POA: Diagnosis not present

## 2013-10-17 DIAGNOSIS — E782 Mixed hyperlipidemia: Secondary | ICD-10-CM | POA: Diagnosis not present

## 2013-10-17 DIAGNOSIS — R7309 Other abnormal glucose: Secondary | ICD-10-CM

## 2013-10-17 DIAGNOSIS — R030 Elevated blood-pressure reading, without diagnosis of hypertension: Secondary | ICD-10-CM

## 2013-10-17 DIAGNOSIS — R059 Cough, unspecified: Secondary | ICD-10-CM | POA: Diagnosis not present

## 2013-10-17 LAB — CBC WITH DIFFERENTIAL/PLATELET
Eosinophils Absolute: 0.2 10*3/uL (ref 0.0–0.7)
Eosinophils Relative: 3 % (ref 0–5)
Hemoglobin: 13.4 g/dL (ref 13.0–17.0)
Lymphs Abs: 1.8 10*3/uL (ref 0.7–4.0)
MCH: 30.3 pg (ref 26.0–34.0)
MCV: 86.7 fL (ref 78.0–100.0)
Monocytes Absolute: 0.7 10*3/uL (ref 0.1–1.0)
Monocytes Relative: 8 % (ref 3–12)
Platelets: 173 10*3/uL (ref 150–400)
RBC: 4.42 MIL/uL (ref 4.22–5.81)

## 2013-10-17 LAB — HEMOGLOBIN A1C
Hgb A1c MFr Bld: 6.1 % — ABNORMAL HIGH (ref <5.7)
Mean Plasma Glucose: 128 mg/dL — ABNORMAL HIGH (ref <117)

## 2013-10-17 MED ORDER — AZITHROMYCIN 250 MG PO TABS: ORAL_TABLET | ORAL | Status: AC

## 2013-10-17 NOTE — Patient Instructions (Signed)
Elevated Blood sugar/ Diabetes Meal Planning Guide The diabetes meal planning guide is a tool to help you plan your meals and snacks. It is important for people with diabetes to manage their blood glucose (sugar) levels. Choosing the right foods and the right amounts throughout your day will help control your blood glucose. Eating right can even help you improve your blood pressure and reach or maintain a healthy weight. CARBOHYDRATE COUNTING MADE EASY When you eat carbohydrates, they turn to sugar. This raises your blood glucose level. Counting carbohydrates can help you control this level so you feel better. When you plan your meals by counting carbohydrates, you can have more flexibility in what you eat and balance your medicine with your food intake. Carbohydrate counting simply means adding up the total amount of carbohydrate grams in your meals and snacks. Try to eat about the same amount at each meal. Foods with carbohydrates are listed below. Each portion below is 1 carbohydrate serving or 15 grams of carbohydrates. Ask your dietician how many grams of carbohydrates you should eat at each meal or snack. Grains and Starches  1 slice bread.   English muffin or hotdog/hamburger bun.   cup cold cereal (unsweetened).   cup cooked pasta or rice.   cup starchy vegetables (corn, potatoes, peas, beans, winter squash).  1 tortilla (6 inches).   bagel.  1 waffle or pancake (size of a CD).   cup cooked cereal.  4 to 6 small crackers. *Whole grain is recommended. Fruit  1 cup fresh unsweetened berries, melon, papaya, pineapple.  1 small fresh fruit.   banana or mango.   cup fruit juice (4 oz unsweetened).   cup canned fruit in natural juice or water.  2 tbs dried fruit.  12 to 15 grapes or cherries. Milk and Yogurt  1 cup fat-free or 1% milk.  1 cup soy milk.  6 oz light yogurt with sugar-free sweetener.  6 oz low-fat soy yogurt.  6 oz plain  yogurt. Vegetables  1 cup raw or  cup cooked is counted as 0 carbohydrates or a "free" food.  If you eat 3 or more servings at 1 meal, count them as 1 carbohydrate serving. Other Carbohydrates   oz chips or pretzels.   cup ice cream or frozen yogurt.   cup sherbet or sorbet.  2 inch square cake, no frosting.  1 tbs honey, sugar, jam, jelly, or syrup.  2 small cookies.  3 squares of graham crackers.  3 cups popcorn.  6 crackers.  1 cup broth-based soup.  Count 1 cup casserole or other mixed foods as 2 carbohydrate servings.  Foods with less than 20 calories in a serving may be counted as 0 carbohydrates or a "free" food. You may want to purchase a book or computer software that lists the carbohydrate gram counts of different foods. In addition, the nutrition facts panel on the labels of the foods you eat are a good source of this information. The label will tell you how big the serving size is and the total number of carbohydrate grams you will be eating per serving. Divide this number by 15 to obtain the number of carbohydrate servings in a portion. Remember, 1 carbohydrate serving equals 15 grams of carbohydrate. SERVING SIZES Measuring foods and serving sizes helps you make sure you are getting the right amount of food. The list below tells how big or small some common serving sizes are.  1 oz.........4 stacked dice.  3 oz........Marland KitchenDeck of cards.  1 tsp.......Marland KitchenTip of little finger.  1 tbs......Marland KitchenMarland KitchenThumb.  2 tbs.......Marland KitchenGolf ball.   cup......Marland KitchenHalf of a fist.  1 cup.......Marland KitchenA fist. SAMPLE DIABETES MEAL PLAN Below is a sample meal plan that includes foods from the grain and starches, dairy, vegetable, fruit, and meat groups. A dietician can individualize a meal plan to fit your calorie needs and tell you the number of servings needed from each food group. However, controlling the total amount of carbohydrates in your meal or snack is more important than making sure you  include all of the food groups at every meal. You may interchange carbohydrate containing foods (dairy, starches, and fruits). The meal plan below is an example of a 2000 calorie diet using carbohydrate counting. This meal plan has 17 carbohydrate servings. Breakfast  1 cup oatmeal (2 carb servings).   cup light yogurt (1 carb serving).  1 cup blueberries (1 carb serving).   cup almonds. Snack  1 large apple (2 carb servings).  1 low-fat string cheese stick. Lunch  Chicken breast salad.  1 cup spinach.   cup chopped tomatoes.  2 oz chicken breast, sliced.  2 tbs low-fat [place] dressing.  12 whole-wheat crackers (2 carb servings).  12 to 15 grapes (1 carb serving).  1 cup low-fat milk (1 carb serving). Snack  1 cup carrots.   cup hummus (1 carb serving). Dinner  3 oz broiled salmon.  1 cup brown rice (3 carb servings). Snack  1  cups steamed broccoli (1 carb serving) drizzled with 1 tsp olive oil and lemon juice.  1 cup light pudding (2 carb servings). DIABETES MEAL PLANNING WORKSHEET Your dietician can use this worksheet to help you decide how many servings of foods and what types of foods are right for you.  BREAKFAST Food Group and Servings / Carb Servings Grain/Starches __________________________________ Dairy __________________________________________ Vegetable ______________________________________ Fruit ___________________________________________ Meat __________________________________________ Fat ____________________________________________ LUNCH Food Group and Servings / Carb Servings Grain/Starches ___________________________________ Dairy ___________________________________________ Fruit ____________________________________________ Meat ___________________________________________ Fat _____________________________________________ Laural Golden Food Group and Servings / Carb Servings Grain/Starches ___________________________________ Dairy  ___________________________________________ Fruit ____________________________________________ Meat ___________________________________________ Fat _____________________________________________ SNACKS Food Group and Servings / Carb Servings Grain/Starches ___________________________________ Dairy ___________________________________________ Vegetable _______________________________________ Fruit ____________________________________________ Meat ___________________________________________ Fat _____________________________________________ DAILY TOTALS Starches _________________________ Vegetable ________________________ Fruit ____________________________ Dairy ____________________________ Meat ____________________________ Fat ______________________________ Document Released: 08/06/2005 Document Revised: 02/01/2012 Document Reviewed: 06/17/2009 [employer] Patient Information 2014 [employer] Cough, Adult  A cough is a reflex. It helps you clear your throat and airways. A cough can help heal your body. A cough can last 2 or 3 weeks (acute) or may last more than 8 weeks (chronic). Some common causes of a cough can include an infection, allergy, or a cold. HOME CARE  Only take medicine as told by your doctor.  If given, take your medicines (antibiotics) as told. Finish them even if you start to feel better.  Use a cold steam vaporizer or humidier in your home. This can help loosen thick spit (secretions).  Sleep so you are almost sitting up (semi-upright). Use pillows to do this. This helps reduce coughing.  Rest as needed.  Stop smoking if you smoke. GET HELP RIGHT AWAY IF:  You have yellowish-white fluid (pus) in your thick spit.  Your cough gets worse.  Your medicine does not reduce coughing, and you are losing sleep.  You cough up blood.  You have trouble breathing.  Your pain gets worse and medicine does not help.  You have a fever. MAKE SURE YOU:  Understand  these instructions.  Will watch your condition.  Will get help right away if you are not doing well or get worse. Document Released: 07/23/2011 Document Revised: 02/01/2012 Document Reviewed: 07/23/2011 [facility] Patient Information 2014 [place], [place].

## 2013-10-18 ENCOUNTER — Encounter: Payer: Self-pay | Admitting: Emergency Medicine

## 2013-10-18 LAB — HEPATIC FUNCTION PANEL
ALT: 9 U/L (ref 0–53)
AST: 12 U/L (ref 0–37)
Albumin: 3.7 g/dL (ref 3.5–5.2)
Total Protein: 6.2 g/dL (ref 6.0–8.3)

## 2013-10-18 LAB — BASIC METABOLIC PANEL WITH GFR
BUN: 12 mg/dL (ref 6–23)
CO2: 26 mEq/L (ref 19–32)
Calcium: 9 mg/dL (ref 8.4–10.5)
Creat: 0.95 mg/dL (ref 0.50–1.35)
Glucose, Bld: 91 mg/dL (ref 70–99)

## 2013-10-18 LAB — LIPID PANEL
Cholesterol: 164 mg/dL (ref 0–200)
Triglycerides: 171 mg/dL — ABNORMAL HIGH (ref <150)
VLDL: 34 mg/dL (ref 0–40)

## 2013-10-18 LAB — INSULIN, FASTING: Insulin fasting, serum: 9 u[IU]/mL (ref 3–28)

## 2013-10-18 NOTE — Progress Notes (Signed)
  Subjective:    Patient ID: Paul Davies, male    DOB: Jan 31, 1930, 79 y.o.   MRN: [id number]  HPI Comments: 79 YO MALE presents for 3 month F/U for HTN, Cholesterol, Pre-Dm, D. Deficient. BP good at home. Eating descent. Exercises with walking and yard work. 1 episode of diarrhea all day Saturday but has resolved. He has chest congestion x 2-3 weeks and now with color to production. He has coumadin recheck 10/29/13 and numbers have been stable.   Hyperlipidemia     Current Outpatient Prescriptions on File Prior to Visit  Medication Sig Dispense Refill  . cholecalciferol (VITAMIN D) 1000 UNITS tablet Take 1,000 Units by mouth daily.      . finasteride (PROSCAR) 5 MG tablet Take 5 mg by mouth daily.      Marland Kitchen gabapentin (NEURONTIN) 300 MG capsule Take 300 mg by mouth at bedtime.      Marland Kitchen levothyroxine (SYNTHROID, LEVOTHROID) 50 MCG tablet Take 50 mcg by mouth daily before breakfast.      . vitamin B-12 (CYANOCOBALAMIN) 100 MCG tablet Take 100 mcg by mouth daily.      Marland Kitchen warfarin (COUMADIN) 6 MG tablet Take 6 mg by mouth daily.       No current facility-administered medications on file prior to visit.   ALLERGIES Bee venom  Past Medical History  Diagnosis Date  . Stroke     twice  . Hyperlipidemia   . Hypertension   . OSA (obstructive sleep apnea)   . RLS (restless legs syndrome)   . Osteopenia   . Thyroid disease   . Vitamin D deficiency   . Pacemaker   . DJD (degenerative joint disease)     Review of Systems  HENT: Positive for congestion and postnasal drip.   Respiratory: Positive for cough.   All other systems reviewed and are negative.   BP 120/72  Pulse 62  Temp(Src) 98.2 F (36.8 C) (Temporal)  Resp 16  Ht 5\' 8"  (1.727 m)  Wt 173 lb (78.472 kg)  BMI 26.31 kg/m2     Objective:   Physical Exam  Vitals reviewed. Constitutional: He is oriented to person, place, and time. He appears well-developed and well-nourished.  HENT:  Head: Normocephalic and atraumatic.   Right Ear: External ear normal.  Left Ear: External ear normal.  Nose: Nose normal.  Mouth/Throat: Oropharynx is clear and moist.  Yellow Tms, frontal tenderness  Eyes: Pupils are equal, round, and reactive to light.  Neck: Normal range of motion. Neck supple. No thyromegaly present.  Cardiovascular: Normal rate, regular rhythm, normal heart sounds and intact distal pulses.   Pulmonary/Chest: Effort normal and breath sounds normal.  Congestion clears with cough  Abdominal: Soft. Bowel sounds are normal. He exhibits no distension. There is no tenderness.  Musculoskeletal: Normal range of motion.  Lymphadenopathy:    He has no cervical adenopathy.  Neurological: He is alert and oriented to person, place, and time.  Skin: Skin is warm and dry.  Psychiatric: He has a normal mood and affect.          Assessment & Plan:  1.3 month F/U for HTN, Cholesterol, Pre-Dm, D. Deficient check labs, continue healthy diet and exercise 2. Diarrhea- Prob viral episode push fluids check labs 3. Sinus/ chest congestion probable infection. Zpak AD if any SE with coumadin will call for early lab recheck. Add Allegra AD

## 2013-11-08 DIAGNOSIS — E039 Hypothyroidism, unspecified: Secondary | ICD-10-CM | POA: Diagnosis not present

## 2013-11-08 DIAGNOSIS — I951 Orthostatic hypotension: Secondary | ICD-10-CM | POA: Diagnosis not present

## 2013-11-08 DIAGNOSIS — Z950 Presence of cardiac pacemaker: Secondary | ICD-10-CM | POA: Diagnosis not present

## 2013-11-08 DIAGNOSIS — I4891 Unspecified atrial fibrillation: Secondary | ICD-10-CM | POA: Diagnosis not present

## 2013-11-08 DIAGNOSIS — E785 Hyperlipidemia, unspecified: Secondary | ICD-10-CM | POA: Diagnosis not present

## 2013-11-08 DIAGNOSIS — Z7901 Long term (current) use of anticoagulants: Secondary | ICD-10-CM | POA: Diagnosis not present

## 2013-11-20 DIAGNOSIS — I951 Orthostatic hypotension: Secondary | ICD-10-CM | POA: Diagnosis not present

## 2013-11-20 DIAGNOSIS — I4891 Unspecified atrial fibrillation: Secondary | ICD-10-CM | POA: Diagnosis not present

## 2013-11-20 DIAGNOSIS — E039 Hypothyroidism, unspecified: Secondary | ICD-10-CM | POA: Diagnosis not present

## 2013-11-20 DIAGNOSIS — Z7901 Long term (current) use of anticoagulants: Secondary | ICD-10-CM | POA: Diagnosis not present

## 2013-11-20 DIAGNOSIS — E785 Hyperlipidemia, unspecified: Secondary | ICD-10-CM | POA: Diagnosis not present

## 2013-11-20 DIAGNOSIS — Z950 Presence of cardiac pacemaker: Secondary | ICD-10-CM | POA: Diagnosis not present

## 2013-11-22 DIAGNOSIS — E039 Hypothyroidism, unspecified: Secondary | ICD-10-CM | POA: Diagnosis not present

## 2013-11-22 DIAGNOSIS — Z950 Presence of cardiac pacemaker: Secondary | ICD-10-CM | POA: Diagnosis not present

## 2013-11-22 DIAGNOSIS — Z7901 Long term (current) use of anticoagulants: Secondary | ICD-10-CM | POA: Diagnosis not present

## 2013-11-22 DIAGNOSIS — I4891 Unspecified atrial fibrillation: Secondary | ICD-10-CM | POA: Diagnosis not present

## 2013-11-22 DIAGNOSIS — E785 Hyperlipidemia, unspecified: Secondary | ICD-10-CM | POA: Diagnosis not present

## 2013-11-22 DIAGNOSIS — I951 Orthostatic hypotension: Secondary | ICD-10-CM | POA: Diagnosis not present

## 2013-12-05 DIAGNOSIS — E039 Hypothyroidism, unspecified: Secondary | ICD-10-CM | POA: Diagnosis not present

## 2013-12-05 DIAGNOSIS — Z950 Presence of cardiac pacemaker: Secondary | ICD-10-CM | POA: Diagnosis not present

## 2013-12-05 DIAGNOSIS — E785 Hyperlipidemia, unspecified: Secondary | ICD-10-CM | POA: Diagnosis not present

## 2013-12-05 DIAGNOSIS — I4891 Unspecified atrial fibrillation: Secondary | ICD-10-CM | POA: Diagnosis not present

## 2013-12-05 DIAGNOSIS — Z7901 Long term (current) use of anticoagulants: Secondary | ICD-10-CM | POA: Diagnosis not present

## 2013-12-05 DIAGNOSIS — I951 Orthostatic hypotension: Secondary | ICD-10-CM | POA: Diagnosis not present

## 2013-12-11 ENCOUNTER — Ambulatory Visit (INDEPENDENT_AMBULATORY_CARE_PROVIDER_SITE_OTHER): Payer: Medicare Other | Admitting: Physician Assistant

## 2013-12-11 ENCOUNTER — Encounter: Payer: Self-pay | Admitting: Physician Assistant

## 2013-12-11 VITALS — BP 110/70 | HR 72 | Temp 97.7°F | Resp 16 | Ht 68.0 in | Wt 174.0 lb

## 2013-12-11 DIAGNOSIS — E785 Hyperlipidemia, unspecified: Secondary | ICD-10-CM | POA: Insufficient documentation

## 2013-12-11 DIAGNOSIS — D649 Anemia, unspecified: Secondary | ICD-10-CM | POA: Diagnosis not present

## 2013-12-11 DIAGNOSIS — R7309 Other abnormal glucose: Secondary | ICD-10-CM | POA: Diagnosis not present

## 2013-12-11 DIAGNOSIS — R7303 Prediabetes: Secondary | ICD-10-CM

## 2013-12-11 DIAGNOSIS — Z79899 Other long term (current) drug therapy: Secondary | ICD-10-CM

## 2013-12-11 DIAGNOSIS — I10 Essential (primary) hypertension: Secondary | ICD-10-CM

## 2013-12-11 DIAGNOSIS — E538 Deficiency of other specified B group vitamins: Secondary | ICD-10-CM

## 2013-12-11 DIAGNOSIS — Z1212 Encounter for screening for malignant neoplasm of rectum: Secondary | ICD-10-CM

## 2013-12-11 DIAGNOSIS — E559 Vitamin D deficiency, unspecified: Secondary | ICD-10-CM

## 2013-12-11 LAB — CBC WITH DIFFERENTIAL/PLATELET
BASOS ABS: 0 10*3/uL (ref 0.0–0.1)
Basophils Relative: 0 % (ref 0–1)
EOS PCT: 5 % (ref 0–5)
Eosinophils Absolute: 0.3 10*3/uL (ref 0.0–0.7)
HEMATOCRIT: 42.8 % (ref 39.0–52.0)
Hemoglobin: 14.8 g/dL (ref 13.0–17.0)
Lymphocytes Relative: 29 % (ref 12–46)
Lymphs Abs: 1.6 10*3/uL (ref 0.7–4.0)
MCH: 30.4 pg (ref 26.0–34.0)
MCHC: 34.6 g/dL (ref 30.0–36.0)
MCV: 87.9 fL (ref 78.0–100.0)
MONO ABS: 0.5 10*3/uL (ref 0.1–1.0)
Monocytes Relative: 9 % (ref 3–12)
Neutro Abs: 3.2 10*3/uL (ref 1.7–7.7)
Neutrophils Relative %: 57 % (ref 43–77)
Platelets: 161 10*3/uL (ref 150–400)
RBC: 4.87 MIL/uL (ref 4.22–5.81)
RDW: 14.5 % (ref 11.5–15.5)
WBC: 5.6 10*3/uL (ref 4.0–10.5)

## 2013-12-11 LAB — BASIC METABOLIC PANEL WITH GFR
BUN: 15 mg/dL (ref 6–23)
CHLORIDE: 103 meq/L (ref 96–112)
CO2: 27 meq/L (ref 19–32)
Calcium: 9.5 mg/dL (ref 8.4–10.5)
Creat: 0.86 mg/dL (ref 0.50–1.35)
GFR, Est African American: >89 mL/min
GFR, Est Non African American: 81 mL/min
Glucose, Bld: 104 mg/dL — ABNORMAL HIGH (ref 70–99)
Potassium: 4.4 mEq/L (ref 3.5–5.3)
Sodium: 139 mEq/L (ref 135–145)

## 2013-12-11 LAB — HEPATIC FUNCTION PANEL
ALBUMIN: 4.2 g/dL (ref 3.5–5.2)
ALK PHOS: 58 U/L (ref 39–117)
ALT: 9 U/L (ref 0–53)
AST: 17 U/L (ref 0–37)
Bilirubin, Direct: 0.1 mg/dL (ref 0.0–0.3)
Indirect Bilirubin: 0.5 mg/dL (ref 0.0–0.9)
TOTAL PROTEIN: 7 g/dL (ref 6.0–8.3)
Total Bilirubin: 0.6 mg/dL (ref 0.3–1.2)

## 2013-12-11 LAB — HEMOGLOBIN A1C
Hgb A1c MFr Bld: 5.9 % — ABNORMAL HIGH (ref <5.7)
MEAN PLASMA GLUCOSE: 123 mg/dL — AB (ref <117)

## 2013-12-11 LAB — IRON AND TIBC
%SAT: 35 % (ref 20–55)
IRON: 136 ug/dL (ref 42–165)
TIBC: 387 ug/dL (ref 215–435)
UIBC: 251 ug/dL (ref 125–400)

## 2013-12-11 LAB — URINALYSIS, ROUTINE W REFLEX MICROSCOPIC
Bilirubin Urine: NEGATIVE
Glucose, UA: NEGATIVE mg/dL
Ketones, ur: NEGATIVE mg/dL
Leukocytes, UA: NEGATIVE
Nitrite: NEGATIVE
Protein, ur: NEGATIVE mg/dL
Specific Gravity, Urine: 1.018 (ref 1.005–1.030)
Urobilinogen, UA: 0.2 mg/dL (ref 0.0–1.0)
pH: 6.5 (ref 5.0–8.0)

## 2013-12-11 LAB — URINALYSIS, MICROSCOPIC ONLY
Bacteria, UA: NONE SEEN
Casts: NONE SEEN
Crystals: NONE SEEN
Squamous Epithelial / LPF: NONE SEEN

## 2013-12-11 LAB — TSH: TSH: 3.845 u[IU]/mL (ref 0.350–4.500)

## 2013-12-11 LAB — LIPID PANEL
Cholesterol: 253 mg/dL — ABNORMAL HIGH (ref 0–200)
HDL: 66 mg/dL (ref >39)
LDL Cholesterol: 151 mg/dL — ABNORMAL HIGH (ref 0–99)
Total CHOL/HDL Ratio: 3.8 Ratio
Triglycerides: 179 mg/dL — ABNORMAL HIGH (ref <150)
VLDL: 36 mg/dL (ref 0–40)

## 2013-12-11 LAB — MICROALBUMIN / CREATININE URINE RATIO
Creatinine, Urine: 119.4 mg/dL
MICROALB UR: 0.5 mg/dL (ref 0.00–1.89)
Microalb Creat Ratio: 4.2 mg/g (ref 0.0–30.0)

## 2013-12-11 LAB — MAGNESIUM: Magnesium: 2 mg/dL (ref 1.5–2.5)

## 2013-12-11 NOTE — Progress Notes (Signed)
Complete Physical HPI Patient presents for complete physical.   Patient's blood pressure has been controlled at home. Patient denies chest pain, shortness of breath, dizziness. BP: 110/70 mmHg  Patient's cholesterol is diet controlled. The patient's cholesterol last visit was  Lab Results  Component Value Date   CHOL 164 10/17/2013   HDL 47 10/17/2013   LDLCALC 83 10/17/2013   TRIG 171* 10/17/2013   CHOLHDL 3.5 10/17/2013   The patient has been working on diet and exercise for prediabetes, denies changes in vision, polys, and paresthesias. Last A1C in office was  Hemoglobin A1C  Date Value Range Status  10/17/2013 6.1* <5.7 % Final   He is on klonopin and gabapentin for his RLS and on CPAP at night and states this helps.  He is on Coumadin for Afib and sees [name], his last Coumadin was normal. He denies any abnormal bleeding. He sees [name] q 6 months, next appointment in April.  Current Medications:  Current Outpatient Prescriptions on File Prior to Visit  Medication Sig Dispense Refill  . Calcium Carbonate-Vitamin D (CALCIUM 500 + D PO) Take by mouth daily.      . cholecalciferol (VITAMIN D) 1000 UNITS tablet Take 1,000 Units by mouth daily.      . finasteride (PROSCAR) 5 MG tablet Take 5 mg by mouth daily.      Marland Kitchen gabapentin (NEURONTIN) 300 MG capsule Take 300 mg by mouth at bedtime.      Marland Kitchen levothyroxine (SYNTHROID, LEVOTHROID) 50 MCG tablet Take 50 mcg by mouth daily before breakfast.      . Multiple Vitamin (MULTI VITAMIN DAILY PO) Take by mouth daily.      . vitamin B-12 (CYANOCOBALAMIN) 100 MCG tablet Take 100 mcg by mouth daily.      Marland Kitchen warfarin (COUMADIN) 6 MG tablet Take 6 mg by mouth daily.       No current facility-administered medications on file prior to visit.   Health Maintenance:  Tetanus: 2014 Pneumovax: 2014 Flu vaccine:at [facility] High Dose flu 09/2013.  Zostavax: needs DEXA: 08/2012 osteopenia Colonoscopy: 01/15/2009 Dr. Ardis Hughs.  EGD: N/A  CXR  01/2013 COPD changes Allergies:  Allergies  Allergen Reactions  . Bee Venom    Medical History:  Past Medical History  Diagnosis Date  . Stroke     twice  . Hyperlipidemia   . Hypertension   . OSA (obstructive sleep apnea)   . RLS (restless legs syndrome)   . Osteopenia   . Thyroid disease   . Vitamin D deficiency   . Pacemaker   . DJD (degenerative joint disease)   . Atherosclerosis of aorta 2010    noted on lumbar CT in 2010  . COPD (chronic obstructive pulmonary disease)     On CXR  . Sinoatrial node dysfunction     s/p pacemaker   Surgical History:  Past Surgical History  Procedure Laterality Date  . Pacemaker insertion     Family History:  Family History  Problem Relation Age of Onset  . Heart attack Father    Social History:  History   Social History  . Marital Status: Married    Spouse Name: N/A    Number of Children: N/A  . Years of Education: N/A   Occupational History  . Not on file.   Social History Main Topics  . Smoking status: Never Smoker   . Smokeless tobacco: Never Used  . Alcohol Use: No  . Drug Use: No  . Sexual Activity:  Not on file   Other Topics Concern  . Not on file   Social History Narrative  . No narrative on file   ROS Constitutional: Denies weight loss/gain, headaches, insomnia, fatigue, night sweats, and change in appetite. Eyes: Yearly, glasses, 12/1012 and has an appointment. Denies redness, blurred vision, diplopia, discharge, itchy, watery eyes.  ENT: Denies discharge, congestion, post nasal drip, sore throat, earache, hearing loss, dental pain, Tinnitus, Vertigo, Sinus pain, snoring.  Cardio: ([name])  Denies chest pain, palpitations, irregular heartbeat, dyspnea, diaphoresis, orthopnea, PND, claudication, edema Respiratory: + cough, allergy pill at night helps denies dyspnea, pleurisy, hoarseness, wheezing.  Gastrointestinal: Ardis Hughs)  Denies dysphagia, heartburn, pain, cramps, nausea, vomiting, bloating,  diarrhea, constipation, hematemesis, melena, hematochezia, hemorrhoids Genitourinary: (Dr. Risa Grill) Follows up in March some dribbling, hesitancy, and occ incontinence Denies dysuria, frequency, urgency, nocturia, hesitancy, discharge, hematuria, flank pain Musculoskeletal: (Dr. Rita Ohara) Denies arthralgia, myalgia, stiffness, Jt. Swelling, pain, Skin: April appointment (Dr. Teena Dunk) Denies pruritis, rash, hives,  acne, eczema, changing in skin lesion Neuro: (Dr. Mary Sella) Denies Weakness, tremor, incoordination, spasms, paresthesia, pain Psychiatric: Denies confusion, memory loss, sensory loss Endocrine: Denies change in weight, skin, hair change, nocturia, and paresthesia, Diabetic Denies Polys, visual blurring, hyper /hypo glycemic episodes.  Heme/Lymph: Denies Excessive bleeding, bruising, enlarged lymph nodes  Physical Exam: Estimated body mass index is 26.46 kg/(m^2) as calculated from the following:   Height as of this encounter: 5\' 8"  (1.727 m).   Weight as of this encounter: 174 lb (78.926 kg). Filed Vitals:   12/11/13 0843  BP: 110/70  Pulse: 72  Temp: 97.7 F (36.5 C)  Resp: 16   General Appearance: Well nourished, in no apparent distress. Eyes: PERRLA, EOMs, conjunctiva no swelling or erythema, normal fundi and vessels. Sinuses: No Frontal/maxillary tenderness ENT/Mouth: Ext aud canals clear, normal light reflex with TMs without erythema, bulging. Good dentition. No erythema, swelling, or exudate on post pharynx. Tonsils not swollen or erythematous. Hearing decreased, he has hearing aids but does not wear them.  Neck: Supple, thyroid normal. No bruits Respiratory: Respiratory effort normal, BS equal bilaterally without rales, rhonchi, wheezing or stridor. Cardio: Irreg, Irreg with 2/6 systolic murmur without rubs or gallops. Brisk peripheral pulses without edema.  Chest: symmetric, with normal excursions and percussion. Abdomen: Soft, +BS. Non tender, no guarding, rebound, hernias,  masses, or organomegaly. .  Lymphatics: Non tender without lymphadenopathy.  Genitourinary: defer Musculoskeletal: Full ROM all peripheral extremities,4/5 strength Skin: Warm, dry without rashes, ecchymosis. Several places on his face,ears, and left temple that need to be frozen/removed.  Neuro: Cranial nerves intact, reflexes equal bilaterally. Normal muscle tone, no cerebellar symptoms. Sensation intact.  Psych: Awake and oriented X 3, normal affect, Insight and Judgment appropriate.   EKG: defer to [name],.  Assessment and Plan: Stroke- cont Coumadin  Hyperlipidemia- cont diet and exercise, check level  Hypertensioncont diet and exercise  OSA (obstructive sleep apnea)- cont CPAP  RLS (restless legs syndrome)- cont medications  Osteopenia- will need DEXA end of this year/beginning next year  Thyroid disease- check TSH  Vitamin D deficiency- cont supplement  SA node dysfunction s/p Pacemaker- follow up [name]  DJD (degenerative joint disease)- cont tylenol PRN  COPD via CXR recent- cont exercise Atherosclerosis of aorta- stressed importance of control of DM, Chol, and HTN.  Prediabetes- cont diet and exercise, check level Cough- better with allergy pill- ? GERD given information on GERD can try pepcid.  Fall risk mod-high- does not want PT at this time.  Discussed med's effects and SE's. Screening labs and tests as requested with regular follow-up as recommended.  Vicie Mutters 8:53 AM

## 2013-12-11 NOTE — Patient Instructions (Signed)
 Bad carbs also include fruit juice, alcohol, and sweet tea. These are empty calories that do not signal to your brain that you are full.   Please remember the good carbs are still carbs which convert into sugar. So please measure them out no more than 1/2-1 cup of rice, oatmeal, pasta, and beans.  Veggies are however free foods! Pile them on.   I like lean protein at every meal such as chicken, turkey, pork chops, cottage cheese, etc. Just do not fry these meats and please center your meal around vegetable, the meats should be a side dish.   No all fruit is created equal. Please see the list below, the fruit at the bottom is higher in sugars than the fruit at the top   Fat and Cholesterol Control Diet Fat and cholesterol levels in your blood and organs are influenced by your diet. High levels of fat and cholesterol may lead to diseases of the heart, small and large blood vessels, gallbladder, liver, and pancreas. CONTROLLING FAT AND CHOLESTEROL WITH DIET Although exercise and lifestyle factors are important, your diet is key. That is because certain foods are known to raise cholesterol and others to lower it. The goal is to balance foods for their effect on cholesterol and more importantly, to replace saturated and trans fat with other types of fat, such as monounsaturated fat, polyunsaturated fat, and omega-3 fatty acids. On average, a person should consume no more than 15 to 17 g of saturated fat daily. Saturated and trans fats are considered "bad" fats, and they will raise LDL cholesterol. Saturated fats are primarily found in animal products such as meats, butter, and cream. However, that does not mean you need to give up all your favorite foods. Today, there are good tasting, low-fat, low-cholesterol substitutes for most of the things you like to eat. Choose low-fat or nonfat alternatives. Choose round or loin cuts of red meat. These types of cuts are lowest in fat and cholesterol. Chicken  (without the skin), fish, veal, and ground turkey breast are great choices. Eliminate fatty meats, such as hot dogs and salami. Even shellfish have little or no saturated fat. Have a 3 oz (85 g) portion when you eat lean meat, poultry, or fish. Trans fats are also called "partially hydrogenated oils." They are oils that have been scientifically manipulated so that they are solid at room temperature resulting in a longer shelf life and improved taste and texture of foods in which they are added. Trans fats are found in stick margarine, some tub margarines, cookies, crackers, and baked goods.  When baking and cooking, oils are a great substitute for butter. The monounsaturated oils are especially beneficial since it is believed they lower LDL and raise HDL. The oils you should avoid entirely are saturated tropical oils, such as coconut and palm.  Remember to eat a lot from food groups that are naturally free of saturated and trans fat, including fish, fruit, vegetables, beans, grains (barley, rice, couscous, bulgur wheat), and pasta (without cream sauces).  IDENTIFYING FOODS THAT LOWER FAT AND CHOLESTEROL  Soluble fiber may lower your cholesterol. This type of fiber is found in fruits such as apples, vegetables such as broccoli, potatoes, and carrots, legumes such as beans, peas, and lentils, and grains such as barley. Foods fortified with plant sterols (phytosterol) may also lower cholesterol. You should eat at least 2 g per day of these foods for a cholesterol lowering effect.  Read package labels to identify low-saturated fats,   trans fat free, and low-fat foods at the supermarket. Select cheeses that have only 2 to 3 g saturated fat per ounce. Use a heart-healthy tub margarine that is free of trans fats or partially hydrogenated oil. When buying baked goods (cookies, crackers), avoid partially hydrogenated oils. Breads and muffins should be made from whole grains (whole-wheat or whole oat flour, instead of  "flour" or "enriched flour"). Buy non-creamy canned soups with reduced salt and no added fats.  FOOD PREPARATION TECHNIQUES  Never deep-fry. If you must fry, either stir-fry, which uses very little fat, or use non-stick cooking sprays. When possible, broil, bake, or roast meats, and steam vegetables. Instead of putting butter or margarine on vegetables, use lemon and herbs, applesauce, and cinnamon (for squash and sweet potatoes). Use nonfat yogurt, salsa, and low-fat dressings for salads.  LOW-SATURATED FAT / LOW-FAT FOOD SUBSTITUTES Meats / Saturated Fat (g)  Avoid: Steak, marbled (3 oz/85 g) / 11 g  Choose: Steak, lean (3 oz/85 g) / 4 g  Avoid: Hamburger (3 oz/85 g) / 7 g  Choose: Hamburger, lean (3 oz/85 g) / 5 g  Avoid: Ham (3 oz/85 g) / 6 g  Choose: Ham, lean cut (3 oz/85 g) / 2.4 g  Avoid: Chicken, with skin, dark meat (3 oz/85 g) / 4 g  Choose: Chicken, skin removed, dark meat (3 oz/85 g) / 2 g  Avoid: Chicken, with skin, light meat (3 oz/85 g) / 2.5 g  Choose: Chicken, skin removed, light meat (3 oz/85 g) / 1 g Dairy / Saturated Fat (g)  Avoid: Whole milk (1 cup) / 5 g  Choose: Low-fat milk, 2% (1 cup) / 3 g  Choose: Low-fat milk, 1% (1 cup) / 1.5 g  Choose: Skim milk (1 cup) / 0.3 g  Avoid: Hard cheese (1 oz/28 g) / 6 g  Choose: Skim milk cheese (1 oz/28 g) / 2 to 3 g  Avoid: Cottage cheese, 4% fat (1 cup) / 6.5 g  Choose: Low-fat cottage cheese, 1% fat (1 cup) / 1.5 g  Avoid: Ice cream (1 cup) / 9 g  Choose: Sherbet (1 cup) / 2.5 g  Choose: Nonfat frozen yogurt (1 cup) / 0.3 g  Choose: Frozen fruit bar / trace  Avoid: Whipped cream (1 tbs) / 3.5 g  Choose: Nondairy whipped topping (1 tbs) / 1 g Condiments / Saturated Fat (g)  Avoid: Mayonnaise (1 tbs) / 2 g  Choose: Low-fat mayonnaise (1 tbs) / 1 g  Avoid: Butter (1 tbs) / 7 g  Choose: Extra light margarine (1 tbs) / 1 g  Avoid: Coconut oil (1 tbs) / 11.8 g  Choose: Olive oil (1 tbs) / 1.8  g  Choose: Corn oil (1 tbs) / 1.7 g  Choose: Safflower oil (1 tbs) / 1.2 g  Choose: Sunflower oil (1 tbs) / 1.4 g  Choose: Soybean oil (1 tbs) / 2.4 g  Choose: Canola oil (1 tbs) / 1 g Document Released: 11/09/2005 Document Revised: 03/06/2013 Document Reviewed: 04/30/2011 [employer] Patient Information 2014 [employer].  

## 2014-01-02 DIAGNOSIS — I4891 Unspecified atrial fibrillation: Secondary | ICD-10-CM | POA: Diagnosis not present

## 2014-01-02 DIAGNOSIS — H524 Presbyopia: Secondary | ICD-10-CM | POA: Diagnosis not present

## 2014-01-02 DIAGNOSIS — H2510 Age-related nuclear cataract, unspecified eye: Secondary | ICD-10-CM | POA: Diagnosis not present

## 2014-01-02 DIAGNOSIS — Z7901 Long term (current) use of anticoagulants: Secondary | ICD-10-CM | POA: Diagnosis not present

## 2014-01-02 DIAGNOSIS — I951 Orthostatic hypotension: Secondary | ICD-10-CM | POA: Diagnosis not present

## 2014-01-02 DIAGNOSIS — Z950 Presence of cardiac pacemaker: Secondary | ICD-10-CM | POA: Diagnosis not present

## 2014-01-02 DIAGNOSIS — E785 Hyperlipidemia, unspecified: Secondary | ICD-10-CM | POA: Diagnosis not present

## 2014-01-02 DIAGNOSIS — H538 Other visual disturbances: Secondary | ICD-10-CM | POA: Diagnosis not present

## 2014-01-02 DIAGNOSIS — E039 Hypothyroidism, unspecified: Secondary | ICD-10-CM | POA: Diagnosis not present

## 2014-01-20 ENCOUNTER — Other Ambulatory Visit: Payer: Self-pay | Admitting: Nurse Practitioner

## 2014-01-31 DIAGNOSIS — I951 Orthostatic hypotension: Secondary | ICD-10-CM | POA: Diagnosis not present

## 2014-01-31 DIAGNOSIS — Z950 Presence of cardiac pacemaker: Secondary | ICD-10-CM | POA: Diagnosis not present

## 2014-01-31 DIAGNOSIS — I4891 Unspecified atrial fibrillation: Secondary | ICD-10-CM | POA: Diagnosis not present

## 2014-01-31 DIAGNOSIS — E785 Hyperlipidemia, unspecified: Secondary | ICD-10-CM | POA: Diagnosis not present

## 2014-01-31 DIAGNOSIS — Z7901 Long term (current) use of anticoagulants: Secondary | ICD-10-CM | POA: Diagnosis not present

## 2014-01-31 DIAGNOSIS — E039 Hypothyroidism, unspecified: Secondary | ICD-10-CM | POA: Diagnosis not present

## 2014-02-05 ENCOUNTER — Other Ambulatory Visit: Payer: Self-pay | Admitting: Internal Medicine

## 2014-02-15 DIAGNOSIS — E785 Hyperlipidemia, unspecified: Secondary | ICD-10-CM | POA: Diagnosis not present

## 2014-02-15 DIAGNOSIS — E039 Hypothyroidism, unspecified: Secondary | ICD-10-CM | POA: Diagnosis not present

## 2014-02-15 DIAGNOSIS — Z7901 Long term (current) use of anticoagulants: Secondary | ICD-10-CM | POA: Diagnosis not present

## 2014-02-15 DIAGNOSIS — I4891 Unspecified atrial fibrillation: Secondary | ICD-10-CM | POA: Diagnosis not present

## 2014-02-15 DIAGNOSIS — N401 Enlarged prostate with lower urinary tract symptoms: Secondary | ICD-10-CM | POA: Diagnosis not present

## 2014-02-15 DIAGNOSIS — N3941 Urge incontinence: Secondary | ICD-10-CM | POA: Diagnosis not present

## 2014-02-15 DIAGNOSIS — I951 Orthostatic hypotension: Secondary | ICD-10-CM | POA: Diagnosis not present

## 2014-02-15 DIAGNOSIS — Z950 Presence of cardiac pacemaker: Secondary | ICD-10-CM | POA: Diagnosis not present

## 2014-02-19 DIAGNOSIS — Z7901 Long term (current) use of anticoagulants: Secondary | ICD-10-CM | POA: Diagnosis not present

## 2014-02-19 DIAGNOSIS — E039 Hypothyroidism, unspecified: Secondary | ICD-10-CM | POA: Diagnosis not present

## 2014-02-19 DIAGNOSIS — Z950 Presence of cardiac pacemaker: Secondary | ICD-10-CM | POA: Diagnosis not present

## 2014-02-19 DIAGNOSIS — I951 Orthostatic hypotension: Secondary | ICD-10-CM | POA: Diagnosis not present

## 2014-02-19 DIAGNOSIS — I4891 Unspecified atrial fibrillation: Secondary | ICD-10-CM | POA: Diagnosis not present

## 2014-02-19 DIAGNOSIS — E785 Hyperlipidemia, unspecified: Secondary | ICD-10-CM | POA: Diagnosis not present

## 2014-03-06 ENCOUNTER — Ambulatory Visit (INDEPENDENT_AMBULATORY_CARE_PROVIDER_SITE_OTHER): Payer: Medicare Other | Admitting: Neurology

## 2014-03-06 ENCOUNTER — Other Ambulatory Visit: Payer: Self-pay | Admitting: Internal Medicine

## 2014-03-06 ENCOUNTER — Encounter: Payer: Self-pay | Admitting: Neurology

## 2014-03-06 VITALS — BP 118/63 | HR 72 | Ht 68.75 in | Wt 175.0 lb

## 2014-03-06 DIAGNOSIS — F0280 Dementia in other diseases classified elsewhere without behavioral disturbance: Secondary | ICD-10-CM

## 2014-03-06 DIAGNOSIS — G309 Alzheimer's disease, unspecified: Principal | ICD-10-CM

## 2014-03-06 DIAGNOSIS — G301 Alzheimer's disease with late onset: Secondary | ICD-10-CM

## 2014-03-06 MED ORDER — CLONAZEPAM 0.5 MG PO TABS: ORAL_TABLET | ORAL | Status: DC

## 2014-03-06 MED ORDER — MEMANTINE HCL ER 28 MG PO CP24: 1.0000 | ORAL_CAPSULE | ORAL | Status: DC

## 2014-03-06 NOTE — Progress Notes (Signed)
[facility] [address] [place]. [place] [address] [phone]       OFFICE FOLLOW-UP NOTE  [name] Date of Birth:  1930-02-17 Medical Record Number:  [id number]   HPI: 16 year Caucasian male with long-standing history of mild dementia as well as remote right MCA   branch infarct secondary to cardiac embolism from atrial fibrillation in June 2006 who is seen today for followup after last visit on 12/21/12. The patient is accompanied by his wife who states that she noticed slight decline in his memory and cognitive function. He has become more forgetful and has trouble remembering recent conversations and events. He however still remains quite independent in activities of daily living. He is quite active running in fact planted his tomato garden and moves his yard with  His tractor. He has no issues with agitation, delusions, hallucinations. He does go out independently and has never gotten lost. He has no issues with gait and has good balance and no falls He has been on Namenda 10 mg twice daily for several years and is turning and well. He is known new complaints today.  ROS:   14 system review of systems is positive for hearing loss, runny nose, trouble swallowing, blurred vision, cold intolerance, difficulty urinating, urgency, restless legs, apnea, date and sleepiness, snoring, memory loss, swollen lymph nodes, agitation, anxiety  PMH:  Past Medical History  Diagnosis Date  . Stroke     twice  . Hyperlipidemia   . Hypertension   . OSA (obstructive sleep apnea)   . RLS (restless legs syndrome)   . Osteopenia   . Thyroid disease   . Vitamin D deficiency   . Pacemaker   . DJD (degenerative joint disease)   . Atherosclerosis of aorta 2010    noted on lumbar CT in 2010  . COPD (chronic obstructive pulmonary disease)     On CXR  . Sinoatrial node dysfunction     s/p pacemaker  . Prediabetes     Social History:  History   Social History  .  Marital Status: Married    Spouse Name: N/A    Number of Children: 90  . Years of Education: N/A   Occupational History  . retired    Social History Main Topics  . Smoking status: Never Smoker   . Smokeless tobacco: Never Used  . Alcohol Use: No  . Drug Use: No  . Sexual Activity: Not on file   Other Topics Concern  . Not on file   Social History Narrative   Patient lives at home with his wife    Patient drinks coffee daily    Medications:   Current Outpatient Prescriptions on File Prior to Visit  Medication Sig Dispense Refill  . Calcium Carbonate-Vitamin D (CALCIUM 500 + D PO) Take by mouth daily.      . cholecalciferol (VITAMIN D) 1000 UNITS tablet Take 4,000 Units by mouth daily.       . clonazePAM (KLONOPIN) 0.5 MG tablet Take 0.5 mg by mouth at bedtime.      . fenofibrate micronized (LOFIBRA) 134 MG capsule Take 134 mg by mouth daily before breakfast.      . gabapentin (NEURONTIN) 300 MG capsule Take 300 mg by mouth at bedtime.      Marland Kitchen levothyroxine (SYNTHROID, LEVOTHROID) 50 MCG tablet Take 50 mcg by mouth daily before breakfast.      . Multiple Vitamin (MULTI VITAMIN DAILY PO) Take by mouth daily.      Marland Kitchen  SYNTHROID 50 MCG tablet TAKE 1 TABLET DAILY  90 tablet  1  . tamsulosin (FLOMAX) 0.4 MG CAPS capsule Take 0.4 mg by mouth.      . vitamin B-12 (CYANOCOBALAMIN) 100 MCG tablet Take 100 mcg by mouth daily.      Marland Kitchen warfarin (COUMADIN) 6 MG tablet Take 6 mg by mouth daily.       No current facility-administered medications on file prior to visit.    Allergies:   Allergies  Allergen Reactions  . Bee Venom     Physical Exam General: well developed, well nourished elderly Caucasian male, seated, in no evident distress Head: head normocephalic and atraumatic. Orohparynx benign Neck: supple with no carotid or supraclavicular bruits Cardiovascular: regular rate and rhythm, no murmurs Musculoskeletal: no deformity Skin:  no rash/petichiae Vascular:  Normal pulses all  extremities Filed Vitals:   03/06/14 1453  BP: 118/63  Pulse: 72   Neurologic Exam Mental Status: Awake and fully alert. Oriented to place and time. Recent and remote memory diminished. Mini-Mental status exam score 21/30 with deficits in recall, registration, attention. Animal naming test 11. Clock drawing 3/4. Geriatric depression scale 2 only not depressed.. Attention span, concentration and fund of knowledge appropriate. Mood and affect appropriate.  Cranial Nerves: Fundoscopic exam reveals sharp disc margins. Pupils equal, briskly reactive to light. Extraocular movements full without nystagmus. Visual fields full to confrontation. Hearing intact. Facial sensation intact. Face, tongue, palate moves normally and symmetrically.  Motor: Normal bulk and tone. Normal strength in all tested extremity muscles. Sensory.: intact to touch and pinprick and vibratory sensation.  Coordination: Rapid alternating movements normal in all extremities. Finger-to-nose and heel-to-shin performed accurately bilaterally. Gait and Station: Arises from chair without difficulty. Stance is stooped. Gait demonstrates short stride length   . unable to heel, toe and tandem walk without difficulty.  Reflexes: 1+ and symmetric. Toes downgoing.       ASSESSMENT: 64 year Caucasian male with history of mild dementia probably Alzheimer's type which has shown some progression on Namenda with remote history of right MCA infarct secondary to cardiogenic embolism in June 2006 from atrial fibrillation who is stable from neurovascular standpoint.    PLAN: I had a long discussion with the patient and his wife with regards to progression of his mild dementia, natural history of Alzheimer's with lack of cure and recommend increasing the Namenda dose to 28 mgXR daily. Continue warfarin anticoagulation for his atrial fibrillation for stroke prevention patient He will return for followup in 6 months with Cecille Rubin ,NP in 6 months  or call earlier if necessary     Note: This document was prepared with digital dictation and possible smart phrase technology. Any transcriptional errors that result from this process are unintentional

## 2014-03-06 NOTE — Patient Instructions (Signed)
I had a long discussion with the patient and his wife with regards to progression of his mild dementia and recommend increasing the Namenda dose to 28 mgXR daily. He will return for followup in 6 months with Cecille Rubin ,NP in 6 months or call earlier if necessary  Alzheimer Disease Alzheimer Disease (AD) is a mental disorder. It causes memory loss and loss of other mental functions, such as learning, thinking, solving problems, communicating, and completing tasks. The mental losses interfere with the ability to perform daily activities at work, at home, or in social situations. AD usually starts in the late 60s or early 21s but can start earlier in life (familial form). The mental changes caused by AD are permanent and worsen over time. As the illness progresses, the ability to do even the simplest things is lost. Survival with AD ranges from several years to as long as 20 years. CAUSES AD is caused by abnormally high levels of a protein (beta-amyloid) in the brain. This protein forms very small deposits within and around the brain's nerve cells. These deposits prevent the nerve cells from working properly. Experts are not certain what causes the beta-amyloid deposits in AD. RISK FACTORS The following major risk factors have been identified:  Increasing age.  Certain genetic variations, such as Down syndrome (trisomy 21). SYMPTOMS The earliest mental change in AD is mild memory loss of recent events, names, or phone numbers. Other symptoms at the beginning of AD include loss of objects, minor loss of vocabulary, and difficulty with complex tasks, such as paying bills or driving in unfamiliar locations. At this stage, you are still able to perform daily activities but need greater effort, more time, or memory aids. Other mental functions deteriorate as AD worsens. These changes slowly go from mild to severe. Symptoms at this stage include:  Difficulty remembering You may not be able to recall  personal information such as your address and telephone number. You may become confused about the date, the season of the year, or your location.  Difficulty maintaining attention You may forget what you wanted to say during conversations and repeat what you have already said.  Difficulty learning new information or tasks You may not remember what you read or the name of a new friend you met.  Difficulty counting or doing math You may have difficulty with complex math problems. You may make mistakes in paying bills or managing your checkbook.  Poor reasoning and judgment You may make poor decisions or not dress right for the weather.  Difficulty communicating You may have regular difficulty remembering words, naming objects, expressing yourself clearly, or writing sentences that make sense.  Difficulty performing familiar daily activities You may get lost driving in familiar locations or need help eating, bathing, dressing, grooming, or using the toilet. You may have difficulty maintaining bladder or bowel control.  Difficulty recognizing familiar faces You may confuse family members or close friends with one another. You may not recognize a close relative or may mistake strangers for family. AD also may cause changes in personality and behavior. These changes include loss of interest or motivation, social withdrawal, anxiety, difficulty sleeping, uncharacteristic anger or combativeness, a false belief that someone is trying to harm you (paranoia), seeing things that are not real (hallucinations), or agitation. Confusion and disruptive behavior are often worse at night and may be triggered by changes in the environment or acute medical issues. DIAGNOSIS  AD is diagnosed through an assessment by your health care provider. During  this assessment, your health care provider will do the following:  Ask you and your family, friends, or caregiver questions about your symptoms, their frequency, their  duration and progression, and the effect they are having on your life.  Ask questions about your personal and family medical history and use of alcohol or drugs, including prescription medicine.  Perform a physical exam and order blood tests and brain imaging exams. Your health care provider may refer you to a specialist for detailed evaluation of your mental functions (neuropsychological testing).  Many different brain disorders, medical conditions, and certain substances can cause symptoms that resemble AD symptoms. These must be ruled out before AD can be diagnosed. If AD is diagnosed, it will be considered either "possible" or "probable" AD. "Possible" AD means that your symptoms are typical of AD and no other disorder is causing them. "Probable" AD means that you also have a family history of AD or genetic test results that support the diagnosis. Certain tests, mostly used in research studies, are highly specific for AD.  TREATMENT  There is currently no cure for AD. The goals of treatment are to:  Slow down the progression of the disease.  Preserve mental function as long as possible.  Manage behavioral symptoms.  Make life easier for the person with AD and their caregivers. The following treatment options are available:  Medicine Certain medicines may help slow memory loss by changing the level of certain chemicals in the brain. Medicine may also help with behavioral symptoms.  Talk therapy Talk therapy provides education, support, and memory aids for people with AD. It is most effective in the early stages of the illness.  Caregiving Caregivers may be family members, friends, or trained medical professionals. They help the person with AD with daily life activities. Caregiving may take place at home or at a nursing facility.  Family support groups These provide education, emotional support, and information about community resources to family members who are taking care of the person  with AD. Document Released: 07/21/2004 Document Revised: 07/12/2013 Document Reviewed: 03/17/2013 [facility] Patient Information 2014 [place], [place].

## 2014-03-12 ENCOUNTER — Telehealth: Payer: Self-pay | Admitting: Neurology

## 2014-03-12 ENCOUNTER — Other Ambulatory Visit: Payer: Self-pay

## 2014-03-12 DIAGNOSIS — G309 Alzheimer's disease, unspecified: Principal | ICD-10-CM

## 2014-03-12 DIAGNOSIS — F0280 Dementia in other diseases classified elsewhere without behavioral disturbance: Secondary | ICD-10-CM

## 2014-03-12 MED ORDER — MEMANTINE HCL ER 28 MG PO CP24: 1.0000 | ORAL_CAPSULE | Freq: Every day | ORAL | Status: DC

## 2014-03-12 NOTE — Telephone Encounter (Signed)
Pharmacy sent a fax asking that we resent the Rx for 90 day supply.

## 2014-03-12 NOTE — Telephone Encounter (Signed)
Kathlee Nations from [facility] calling to verify patient's Namenda script, wants to know if it is a 1 time dose or if patient is to take it daily. Please call at the number listed and use reference # [id number].

## 2014-03-12 NOTE — Telephone Encounter (Signed)
I called back.  Spoke with Trish.  She transferred me to [place].  I verified the directions.  They will proceed with filling the Rx.

## 2014-03-13 DIAGNOSIS — E039 Hypothyroidism, unspecified: Secondary | ICD-10-CM | POA: Diagnosis not present

## 2014-03-13 DIAGNOSIS — Z950 Presence of cardiac pacemaker: Secondary | ICD-10-CM | POA: Diagnosis not present

## 2014-03-13 DIAGNOSIS — I4891 Unspecified atrial fibrillation: Secondary | ICD-10-CM | POA: Diagnosis not present

## 2014-03-13 DIAGNOSIS — E785 Hyperlipidemia, unspecified: Secondary | ICD-10-CM | POA: Diagnosis not present

## 2014-03-13 DIAGNOSIS — I951 Orthostatic hypotension: Secondary | ICD-10-CM | POA: Diagnosis not present

## 2014-03-13 DIAGNOSIS — Z7901 Long term (current) use of anticoagulants: Secondary | ICD-10-CM | POA: Diagnosis not present

## 2014-03-15 DIAGNOSIS — L821 Other seborrheic keratosis: Secondary | ICD-10-CM | POA: Diagnosis not present

## 2014-03-15 DIAGNOSIS — L905 Scar conditions and fibrosis of skin: Secondary | ICD-10-CM | POA: Diagnosis not present

## 2014-03-15 DIAGNOSIS — L570 Actinic keratosis: Secondary | ICD-10-CM | POA: Diagnosis not present

## 2014-03-15 DIAGNOSIS — Z85828 Personal history of other malignant neoplasm of skin: Secondary | ICD-10-CM | POA: Diagnosis not present

## 2014-03-15 DIAGNOSIS — L218 Other seborrheic dermatitis: Secondary | ICD-10-CM | POA: Diagnosis not present

## 2014-03-19 ENCOUNTER — Encounter: Payer: Self-pay | Admitting: Internal Medicine

## 2014-03-19 ENCOUNTER — Ambulatory Visit (INDEPENDENT_AMBULATORY_CARE_PROVIDER_SITE_OTHER): Payer: Medicare Other | Admitting: Internal Medicine

## 2014-03-19 VITALS — BP 110/78 | HR 76 | Temp 97.9°F | Resp 16 | Ht 68.0 in | Wt 176.0 lb

## 2014-03-19 DIAGNOSIS — E785 Hyperlipidemia, unspecified: Secondary | ICD-10-CM

## 2014-03-19 DIAGNOSIS — R7309 Other abnormal glucose: Secondary | ICD-10-CM | POA: Diagnosis not present

## 2014-03-19 DIAGNOSIS — I10 Essential (primary) hypertension: Secondary | ICD-10-CM

## 2014-03-19 DIAGNOSIS — E559 Vitamin D deficiency, unspecified: Secondary | ICD-10-CM | POA: Diagnosis not present

## 2014-03-19 DIAGNOSIS — I2510 Atherosclerotic heart disease of native coronary artery without angina pectoris: Secondary | ICD-10-CM | POA: Insufficient documentation

## 2014-03-19 DIAGNOSIS — Z7901 Long term (current) use of anticoagulants: Secondary | ICD-10-CM | POA: Insufficient documentation

## 2014-03-19 DIAGNOSIS — Z79899 Other long term (current) drug therapy: Secondary | ICD-10-CM | POA: Diagnosis not present

## 2014-03-19 DIAGNOSIS — G4733 Obstructive sleep apnea (adult) (pediatric): Secondary | ICD-10-CM

## 2014-03-19 DIAGNOSIS — J449 Chronic obstructive pulmonary disease, unspecified: Secondary | ICD-10-CM

## 2014-03-19 DIAGNOSIS — R7303 Prediabetes: Secondary | ICD-10-CM

## 2014-03-19 DIAGNOSIS — Z9989 Dependence on other enabling machines and devices: Secondary | ICD-10-CM

## 2014-03-19 LAB — HEMOGLOBIN A1C
Hgb A1c MFr Bld: 6 % — ABNORMAL HIGH (ref <5.7)
Mean Plasma Glucose: 126 mg/dL — ABNORMAL HIGH (ref <117)

## 2014-03-19 LAB — CBC WITH DIFFERENTIAL/PLATELET
Basophils Absolute: 0 10*3/uL (ref 0.0–0.1)
Basophils Relative: 0 % (ref 0–1)
EOS ABS: 0.2 10*3/uL (ref 0.0–0.7)
Eosinophils Relative: 2 % (ref 0–5)
HCT: 41.9 % (ref 39.0–52.0)
HEMOGLOBIN: 14.3 g/dL (ref 13.0–17.0)
LYMPHS ABS: 1.8 10*3/uL (ref 0.7–4.0)
Lymphocytes Relative: 23 % (ref 12–46)
MCH: 30.2 pg (ref 26.0–34.0)
MCHC: 34.1 g/dL (ref 30.0–36.0)
MCV: 88.6 fL (ref 78.0–100.0)
MONOS PCT: 7 % (ref 3–12)
Monocytes Absolute: 0.5 10*3/uL (ref 0.1–1.0)
Neutro Abs: 5.3 10*3/uL (ref 1.7–7.7)
Neutrophils Relative %: 68 % (ref 43–77)
Platelets: 169 10*3/uL (ref 150–400)
RBC: 4.73 MIL/uL (ref 4.22–5.81)
RDW: 13.8 % (ref 11.5–15.5)
WBC: 7.8 10*3/uL (ref 4.0–10.5)

## 2014-03-19 NOTE — Progress Notes (Signed)
Patient ID: Paul Davies, male   DOB: [birth year], 80 y.o.   MRN: [id number]    This very nice 80 y.o. MWM presents for 3 month follow up with Hypertension, Hyperlipidemia,mild SDAT, Pre-Diabetes and Vitamin D Deficiency. Patient remains functional at home and independent in all ADL's and works in his garden and uses a Associate Professor.   HTN predates since   . BP has been controlled at home. Today's BP: 110/78 mmHg. Patient is followed by Dr Wynonia Lawman for Afib and a cacemaker inserted in 2006. Marland KitchenPatient denies any cardiac type chest pain, palpitations, dyspnea/orthopnea/PND, dizziness, claudication, or dependent edema.   Hyperlipidemia is not controlled with diet & meds. Last Cholesterol was 253, Triglycerides were 179 , HDL 66 and LDL 151 in Jan 2015 -  ? off of treatment. Patient denies myalgias or other med SE's.    Also, the patient has history of PreDiabetes with last A1c was 5.9% in Jan 2015. Patient denies any symptoms of reactive hypoglycemia, diabetic polys, paresthesias or visual blurring.   Further, Patient has history of Vitamin D Deficiency with last vitamin D of46 in Aug 2014 and was encouraged to take 4,000 u/daily. Patient supplements vitamin D without any suspected side-effects.  Medication Sig  . Calcium Carbonate-Vitamin D (CALCIUM 500 + D PO) Take by mouth daily.  . cholecalciferol (VITAMIN D) 1000 UNITS tablet Take 4,000 Units by mouth daily.   . clonazePAM (KLONOPIN) 0.5 MG tablet Take 1/2 to 1 tablet at bedtime if needed for sleep  . fenofibrate micronized (LOFIBRA) 134 MG capsule Take 134 mg by mouth daily before breakfast.  . gabapentin (NEURONTIN) 300 MG capsule Take 300 mg by mouth at bedtime.  Marland Kitchen levothyroxine (SYNTHROID, LEVOTHROID) 50 MCG tablet Take 50 mcg by mouth daily before breakfast.  . Memantine HCl ER (NAMENDA XR) 28 MG CP24 Take 28 mg by mouth daily.  . Multiple Vitamin (MULTI VITAMIN DAILY PO) Take by mouth daily.  Marland Kitchen SYNTHROID 50 MCG tablet TAKE 1 TABLET  DAILY  . tamsulosin (FLOMAX) 0.4 MG CAPS capsule Take 0.4 mg by mouth.  . vitamin B-12 (CYANOCOBALAMIN) 100 MCG tablet Take 100 mcg by mouth daily.  Marland Kitchen warfarin (COUMADIN) 6 MG tablet Take 6 mg by mouth daily.   Allergies  Allergen Reactions  . Bee Venom     PMHx:   Past Medical History  Diagnosis Date  . Stroke     twice  . Hyperlipidemia   . Hypertension   . OSA (obstructive sleep apnea)   . RLS (restless legs syndrome)   . Osteopenia   . Thyroid disease   . Vitamin D deficiency   . Pacemaker   . DJD (degenerative joint disease)   . Atherosclerosis of aorta 2010    noted on lumbar CT in 2010  . COPD (chronic obstructive pulmonary disease)     On CXR  . Sinoatrial node dysfunction     s/p pacemaker  . Prediabetes     FHx:    Reviewed / unchanged  SHx:    Reviewed / unchanged   Systems Review: Constitutional: Denies fever, chills, wt changes, headaches, insomnia, fatigue, night sweats, change in appetite. Eyes: Denies redness, blurred vision, diplopia, discharge, itchy, watery eyes.  ENT: Denies discharge, congestion, post nasal drip, epistaxis, sore throat, earache, hearing loss, dental pain, tinnitus, vertigo, sinus pain, snoring.  CV: Denies chest pain, palpitations, irregular heartbeat, syncope, dyspnea, diaphoresis, orthopnea, PND, claudication, edema. Respiratory: denies cough, dyspnea, DOE, pleurisy, hoarseness, laryngitis, wheezing.  Gastrointestinal:  Denies dysphagia, odynophagia, heartburn, reflux, water brash, abdominal pain or cramps, nausea, vomiting, bloating, diarrhea, constipation, hematemesis, melena, hematochezia,  or hemorrhoids. Genitourinary: Denies dysuria, frequency, urgency, nocturia, hesitancy, discharge, hematuria, flank pain. Musculoskeletal: Denies arthralgias, myalgias, stiffness, jt. swelling, pain, limp, strain/sprain.  Skin: Denies pruritus, rash, hives, warts, acne, eczema, change in skin lesion(s). Neuro: No weakness, tremor,  incoordination, spasms, paresthesia, or pain. Psychiatric: Denies confusion, memory loss, or sensory loss. Endo: Denies change in weight, skin, hair change.  Heme/Lymph: No excessive bleeding, bruising, orenlarged lymph nodes.   Exam:  BP 110/78  Pulse 76  Temp(Src) 97.9 F (36.6 C) (Temporal)  Resp 16  Ht 5\' 8"  (1.727 m)  Wt 176 lb (79.833 kg)  BMI 26.77 kg/m2  Appears well nourished - in no distress. Eyes: PERRLA, EOMs, conjunctiva no swelling or erythema. Sinuses: No frontal/maxillary tenderness ENT/Mouth: EAC's clear, TM's nl w/o erythema, bulging. Nares clear w/o erythema, swelling, exudates. Oropharynx clear without erythema or exudates. Oral hygiene is good. Tongue normal, non obstructing. Hearing intact.  Neck: Supple. Thyroid nl. Car 2+/2+ without bruits, nodes or JVD. Chest: Respirations nl with BS clear & equal w/o rales, rhonchi, wheezing or stridor.  Cor: Heart sounds normal w/ regular rate and rhythm without sig. murmurs, gallops, clicks, or rubs. Peripheral pulses normal and equal  without edema.  Abdomen: Soft & bowel sounds normal. Non-tender w/o guarding, rebound, hernias, masses, or organomegaly.  Lymphatics: Unremarkable.  Musculoskeletal: Full ROM all peripheral extremities, joint stability, 5/5 strength, and normal gait.  Skin: Warm, dry without exposed rashes, lesions, ecchymosis apparent.  Neuro: Cranial nerves intact, reflexes equal bilaterally. Sensory-motor testing grossly intact. Tendon reflexes grossly intact.  Pysch: Alert & oriented x 3. Insight and judgement nl & appropriate. No ideations.  Assessment and Plan:  1. Hypertension - Continue monitor blood pressure at home. Continue diet/meds same.  2. Hyperlipidemia - Continue diet/meds, exercise,& lifestyle modifications. Continue monitor periodic cholesterol/liver & renal functions   3. Pre-diabetes/Insulin Resistance - Continue diet, exercise, lifestyle modifications. Monitor appropriate  labs.  4. Vitamin D Deficiency - Continue supplementation.  Recommended regular exercise, BP monitoring, weight control, and discussed med and SE's. Recommended labs to assess and monitor clinical status. Further disposition pending results of labs.

## 2014-03-19 NOTE — Patient Instructions (Signed)

## 2014-03-20 LAB — LIPID PANEL
CHOLESTEROL: 203 mg/dL — AB (ref 0–200)
HDL: 58 mg/dL (ref >39)
LDL Cholesterol: 105 mg/dL — ABNORMAL HIGH (ref 0–99)
Total CHOL/HDL Ratio: 3.5 Ratio
Triglycerides: 199 mg/dL — ABNORMAL HIGH (ref <150)
VLDL: 40 mg/dL (ref 0–40)

## 2014-03-20 LAB — BASIC METABOLIC PANEL WITH GFR
BUN: 20 mg/dL (ref 6–23)
CALCIUM: 9.3 mg/dL (ref 8.4–10.5)
CO2: 23 meq/L (ref 19–32)
Chloride: 104 mEq/L (ref 96–112)
Creat: 0.98 mg/dL (ref 0.50–1.35)
GFR, EST AFRICAN AMERICAN: 83 mL/min
GFR, Est Non African American: 72 mL/min
GLUCOSE: 103 mg/dL — AB (ref 70–99)
Potassium: 4.4 mEq/L (ref 3.5–5.3)
Sodium: 139 mEq/L (ref 135–145)

## 2014-03-20 LAB — HEPATIC FUNCTION PANEL
ALT: 10 U/L (ref 0–53)
AST: 15 U/L (ref 0–37)
Albumin: 4 g/dL (ref 3.5–5.2)
Alkaline Phosphatase: 60 U/L (ref 39–117)
BILIRUBIN INDIRECT: 0.3 mg/dL (ref 0.2–1.2)
Bilirubin, Direct: 0.1 mg/dL (ref 0.0–0.3)
TOTAL PROTEIN: 6.5 g/dL (ref 6.0–8.3)
Total Bilirubin: 0.4 mg/dL (ref 0.2–1.2)

## 2014-03-20 LAB — TSH: TSH: 1.585 u[IU]/mL (ref 0.350–4.500)

## 2014-03-20 LAB — INSULIN, FASTING: INSULIN FASTING, SERUM: 12 u[IU]/mL (ref 3–28)

## 2014-03-20 LAB — VITAMIN D 25 HYDROXY (VIT D DEFICIENCY, FRACTURES): Vit D, 25-Hydroxy: 57 ng/mL (ref 30–89)

## 2014-03-20 LAB — MAGNESIUM: Magnesium: 1.8 mg/dL (ref 1.5–2.5)

## 2014-04-05 ENCOUNTER — Other Ambulatory Visit: Payer: Self-pay | Admitting: Internal Medicine

## 2014-04-11 DIAGNOSIS — E785 Hyperlipidemia, unspecified: Secondary | ICD-10-CM | POA: Diagnosis not present

## 2014-04-11 DIAGNOSIS — I4891 Unspecified atrial fibrillation: Secondary | ICD-10-CM | POA: Diagnosis not present

## 2014-04-11 DIAGNOSIS — Z7901 Long term (current) use of anticoagulants: Secondary | ICD-10-CM | POA: Diagnosis not present

## 2014-04-11 DIAGNOSIS — I951 Orthostatic hypotension: Secondary | ICD-10-CM | POA: Diagnosis not present

## 2014-04-11 DIAGNOSIS — E039 Hypothyroidism, unspecified: Secondary | ICD-10-CM | POA: Diagnosis not present

## 2014-04-11 DIAGNOSIS — Z950 Presence of cardiac pacemaker: Secondary | ICD-10-CM | POA: Diagnosis not present

## 2014-05-01 ENCOUNTER — Other Ambulatory Visit: Payer: Self-pay | Admitting: *Deleted

## 2014-05-01 ENCOUNTER — Other Ambulatory Visit (INDEPENDENT_AMBULATORY_CARE_PROVIDER_SITE_OTHER): Payer: Medicare Other

## 2014-05-01 ENCOUNTER — Other Ambulatory Visit: Payer: Self-pay | Admitting: Internal Medicine

## 2014-05-01 ENCOUNTER — Encounter (HOSPITAL_COMMUNITY): Payer: Self-pay

## 2014-05-01 ENCOUNTER — Encounter: Payer: Self-pay | Admitting: *Deleted

## 2014-05-01 DIAGNOSIS — Z01812 Encounter for preprocedural laboratory examination: Secondary | ICD-10-CM

## 2014-05-01 DIAGNOSIS — I495 Sick sinus syndrome: Secondary | ICD-10-CM | POA: Diagnosis not present

## 2014-05-01 DIAGNOSIS — Z45010 Encounter for checking and testing of cardiac pacemaker pulse generator [battery]: Secondary | ICD-10-CM

## 2014-05-01 DIAGNOSIS — E039 Hypothyroidism, unspecified: Secondary | ICD-10-CM | POA: Diagnosis not present

## 2014-05-01 DIAGNOSIS — Z45018 Encounter for adjustment and management of other part of cardiac pacemaker: Secondary | ICD-10-CM

## 2014-05-01 DIAGNOSIS — Z7901 Long term (current) use of anticoagulants: Secondary | ICD-10-CM | POA: Diagnosis not present

## 2014-05-01 DIAGNOSIS — R5381 Other malaise: Secondary | ICD-10-CM | POA: Diagnosis not present

## 2014-05-01 DIAGNOSIS — Z950 Presence of cardiac pacemaker: Secondary | ICD-10-CM

## 2014-05-01 DIAGNOSIS — I951 Orthostatic hypotension: Secondary | ICD-10-CM | POA: Diagnosis not present

## 2014-05-01 DIAGNOSIS — E785 Hyperlipidemia, unspecified: Secondary | ICD-10-CM

## 2014-05-01 DIAGNOSIS — R5383 Other fatigue: Secondary | ICD-10-CM | POA: Diagnosis not present

## 2014-05-01 DIAGNOSIS — I498 Other specified cardiac arrhythmias: Secondary | ICD-10-CM

## 2014-05-01 DIAGNOSIS — I4891 Unspecified atrial fibrillation: Secondary | ICD-10-CM | POA: Diagnosis not present

## 2014-05-01 LAB — CBC WITH DIFFERENTIAL/PLATELET
BASOS ABS: 0 10*3/uL (ref 0.0–0.1)
BASOS PCT: 0.3 % (ref 0.0–3.0)
EOS PCT: 3.2 % (ref 0.0–5.0)
Eosinophils Absolute: 0.2 10*3/uL (ref 0.0–0.7)
HEMATOCRIT: 38.1 % — AB (ref 39.0–52.0)
Hemoglobin: 12.7 g/dL — ABNORMAL LOW (ref 13.0–17.0)
LYMPHS ABS: 1.6 10*3/uL (ref 0.7–4.0)
Lymphocytes Relative: 24.9 % (ref 12.0–46.0)
MCHC: 33.2 g/dL (ref 30.0–36.0)
MCV: 90.6 fl (ref 78.0–100.0)
MONOS PCT: 9.7 % (ref 3.0–12.0)
Monocytes Absolute: 0.6 10*3/uL (ref 0.1–1.0)
NEUTROS ABS: 4 10*3/uL (ref 1.4–7.7)
Neutrophils Relative %: 61.9 % (ref 43.0–77.0)
Platelets: 164 10*3/uL (ref 150.0–400.0)
RBC: 4.21 Mil/uL — AB (ref 4.22–5.81)
RDW: 13.9 % (ref 11.5–15.5)
WBC: 6.4 10*3/uL (ref 4.0–10.5)

## 2014-05-01 LAB — BASIC METABOLIC PANEL
BUN: 22 mg/dL (ref 6–23)
CO2: 25 meq/L (ref 19–32)
CREATININE: 1.1 mg/dL (ref 0.4–1.5)
Calcium: 9.1 mg/dL (ref 8.4–10.5)
Chloride: 109 mEq/L (ref 96–112)
GFR: 68.83 mL/min (ref >60.00)
GLUCOSE: 89 mg/dL (ref 70–99)
Potassium: 4.3 mEq/L (ref 3.5–5.1)
Sodium: 138 mEq/L (ref 135–145)

## 2014-05-01 NOTE — H&P (Signed)
Paul Davies, Paul Davies  Date of visit:  05/01/2014 DOB:  March 15, 1930    Age:  80 yrs. Medical record number:  [id number] Account number:  [id number] Primary Care Provider: Unk Pinto D ____________________________ CURRENT DIAGNOSES  1. Arrhythmia-Atrial Fibrillation  2. Fatigue/malaise  3. Pacemaker/cardiac insitu  4. Long Term Use Anticoagulant  5. Orthostatic hypotension  6. Hyperlipidemia  7. Hypothyroidism  8. Arrhythmia-Sick Sinus Syndrome ____________________________ ALLERGIES  Atorvastatin, Muscle aches ____________________________ MEDICATIONS  1. Namenda 10 mg Tablet, B.I.D.  2. Synthroid 50 mcg tablet, 1 p.o. daily  3. gabapentin 300 mg capsule, QHS  4. tamsulosin 0.4 mg capsule,extended release 24hr, QHS  5. warfarin 6 mg tablet, 1 1/2 tab 3 times a week 1 tab other days or as directed ____________________________ CHIEF COMPLAINTS  Dizziness  Followup of Pacemaker/cardiac insitu ____________________________ HISTORY OF PRESENT ILLNESS  Patient seen eary for evaluation of fatigue. The patient has a prior history of atrial fibrillation which is paroxysmal and has a prior stroke and is on anticoagulation. He had a permanent pacemaker that was implanted in 2006 and is now at elective replacement on his generator. He came to the office today complaining about significant malaise and fatigue and had an episode where he was at work and the other day and became somewhat weak on his feet. He has not had any syncope. He evidently has been overdoing it and the amylase concerned that he is being too active. He denies angina and has had no bleeding complications from warfarin. Since his generator is now elective replacement arrangements are being made for this to be replaced. Interrogation of the device today shows that it has reverted to the VVI mode at 34 and needs to be replaced. ____________________________ PAST HISTORY  Past Medical Illnesses:  hyperlipidemia, history of CVA  without deficits, CVA 6/06 treated with thrombolytic experimental protocol, mild dementia, orthostatic hypotension;  Cardiovascular Illnesses:  atrial fibrillation, sick sinus syndrome, orthostatic hypotension;  Surgical Procedures:  shoulder repair-rt 2009;  Cardiology Procedures-Invasive:  cardiac cath (left) April 2005, Medtronic pacemaker implant June 2006;  Cardiology Procedures-Noninvasive:  adenosine cardiolite March 2005, echocardiogram June 2006;  Cardiac Cath Results:  normal coronary arteries, normal LVSF;  LVEF of 60% documented via echocardiogram on 05/18/2005,   ____________________________ CARDIO-PULMONARY TEST DATES EKG Date:  03/13/2014;   Cardiac Cath Date:  03/06/2004;  Holter/Event Monitor Date: 08/29/2004;  Nuclear Study Date:  02/15/2004;  Echocardiography Date: 02/28/2004;  Chest Xray Date: 09/13/2007;   ____________________________ FAMILY HISTORY Brother -- Brother dead, Congestive heart failure Brother -- Brother alive and well Father -- Father dead, Congestive heart failure Mother -- Mother dead, Alzheimers disease Sister -- Sister dead, Alzheimers disease Sister -- Sister alive and well Sister -- Sister dead, Alzheimers disease ____________________________ SOCIAL HISTORY Alcohol Use:  no alcohol use;  Smoking:  never smoked;  Diet:  regular diet;  Lifestyle:  married;  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 Respiratory: sleep apnea, wears CPAP Cardiovascular:  please review HPI Abdominal: denies dyspepsia, GI bleeding, constipation, or diarrhea Genitourinary-Male: frequency, nocturia  Musculoskeletal:  denies arthritis, venous insufficiency, or muscle weakness. Neurological:  mild memory  deficit  ____________________________ PHYSICAL EXAMINATION VITAL SIGNS  Blood Pressure:  100/60 Sitting, Right arm, regular cuff  , 98/58 Standing, Right  arm and regular cuff   Pulse:  62/min. Weight:  179.00 lbs. Height:  69"BMI: 26  Constitutional:  pleasant white male in no acute distress Skin:  large lipoma riight shoulder Head:  normocephalic, balding male hair pattern Eyes:  EOMS Intact, PERRLA, C and S clear, Funduscopic exam not done. ENT:  hearing aide present left ear Neck:  supple, no masses, thyromegaly, JVD. Carotid pulses are full and equal bilaterally without bruits. Chest:  normal symmetry, clear to auscultation and percussion., healed pacemaker incision in the left pectoral area Cardiac:  regular rhythm, normal S1 and S2, No S3 or S4, no murmurs, gallops or rubs detected. Peripheral Pulses:  femoral pulses 2+, dorsalis pedis pulse 2+ on the left, dorsalis pedis pulse absent on the right, posterior tibial pulses 2+ Extremities & Back:  no deformities, clubbing, cyanosis, erythema or edema observed. Normal muscle strength and tone. Neurological:  no gross motor or sensory deficits noted, affect appropriate, oriented x3. ____________________________ MOST RECENT LIPID PANEL 12/11/13  CHOL TOTL 253 mg/dl, LDL 151 NM, HDL 66 mg/dl, TRIGLYCER 179 mg/dl,  CHOL/HDL 3.8 (Calc)  ____________________________ IMPRESSIONS/PLAN  1. Permanent pacemaker at elective replacement 2. Paroxysmal atrial fibrillation 3. Orthostatic hypotension 4. Hyperlipidemia under treatment 5. Hypothyroidism 6. Long-term anticoagulation with warfarin  Recommendations:  He clinically is somewhat weak and this may be from his VVI pacing. Spoke with Dr. Caryl Comes who will arrange for him to have a generator change on Friday. He is to go to Dr. Olin Pia office today to have preoperative blood work drawn. ____________________________ Cleda Clarks  1. Generator change: Friday                        ____________________________ Cardiology Physician:  Kerry Hough MD [facility]

## 2014-05-03 DIAGNOSIS — J4489 Other specified chronic obstructive pulmonary disease: Secondary | ICD-10-CM | POA: Diagnosis not present

## 2014-05-03 DIAGNOSIS — I10 Essential (primary) hypertension: Secondary | ICD-10-CM | POA: Diagnosis not present

## 2014-05-03 DIAGNOSIS — G4733 Obstructive sleep apnea (adult) (pediatric): Secondary | ICD-10-CM | POA: Diagnosis not present

## 2014-05-03 DIAGNOSIS — Z8673 Personal history of transient ischemic attack (TIA), and cerebral infarction without residual deficits: Secondary | ICD-10-CM | POA: Diagnosis not present

## 2014-05-03 DIAGNOSIS — E559 Vitamin D deficiency, unspecified: Secondary | ICD-10-CM | POA: Diagnosis not present

## 2014-05-03 DIAGNOSIS — I4891 Unspecified atrial fibrillation: Secondary | ICD-10-CM | POA: Diagnosis not present

## 2014-05-03 DIAGNOSIS — J449 Chronic obstructive pulmonary disease, unspecified: Secondary | ICD-10-CM | POA: Diagnosis not present

## 2014-05-03 DIAGNOSIS — Z7901 Long term (current) use of anticoagulants: Secondary | ICD-10-CM | POA: Diagnosis not present

## 2014-05-03 DIAGNOSIS — G2581 Restless legs syndrome: Secondary | ICD-10-CM | POA: Diagnosis not present

## 2014-05-03 DIAGNOSIS — E039 Hypothyroidism, unspecified: Secondary | ICD-10-CM | POA: Diagnosis not present

## 2014-05-03 DIAGNOSIS — M899 Disorder of bone, unspecified: Secondary | ICD-10-CM | POA: Diagnosis not present

## 2014-05-03 DIAGNOSIS — M1990 Unspecified osteoarthritis, unspecified site: Secondary | ICD-10-CM | POA: Diagnosis not present

## 2014-05-03 DIAGNOSIS — E785 Hyperlipidemia, unspecified: Secondary | ICD-10-CM | POA: Diagnosis not present

## 2014-05-03 DIAGNOSIS — Z45018 Encounter for adjustment and management of other part of cardiac pacemaker: Secondary | ICD-10-CM | POA: Diagnosis not present

## 2014-05-03 DIAGNOSIS — I700 Atherosclerosis of aorta: Secondary | ICD-10-CM | POA: Diagnosis not present

## 2014-05-03 DIAGNOSIS — I495 Sick sinus syndrome: Secondary | ICD-10-CM | POA: Diagnosis not present

## 2014-05-03 DIAGNOSIS — I951 Orthostatic hypotension: Secondary | ICD-10-CM | POA: Diagnosis not present

## 2014-05-03 MED ORDER — SODIUM CHLORIDE 0.9 % IR SOLN
80.0000 mg | Status: AC
  Filled 2014-05-03: qty 2

## 2014-05-03 MED ORDER — CEFAZOLIN SODIUM-DEXTROSE 2-3 GM-% IV SOLR
2.0000 g | INTRAVENOUS | Status: AC
  Filled 2014-05-03: qty 50

## 2014-05-04 ENCOUNTER — Encounter (HOSPITAL_COMMUNITY)
Admission: RE | Disposition: A | Discharge status: Home / Self Care | Payer: Self-pay | Source: Ambulatory Visit | Attending: Internal Medicine

## 2014-05-04 ENCOUNTER — Encounter (HOSPITAL_COMMUNITY): Payer: Self-pay | Admitting: Internal Medicine

## 2014-05-04 ENCOUNTER — Ambulatory Visit (HOSPITAL_COMMUNITY)
Admission: RE | Admit: 2014-05-04 | Discharge: 2014-05-04 | Disposition: A | Discharge status: Home / Self Care | Payer: Medicare Other | Source: Ambulatory Visit | Attending: Internal Medicine | Admitting: Internal Medicine

## 2014-05-04 DIAGNOSIS — I10 Essential (primary) hypertension: Secondary | ICD-10-CM | POA: Insufficient documentation

## 2014-05-04 DIAGNOSIS — I700 Atherosclerosis of aorta: Secondary | ICD-10-CM | POA: Insufficient documentation

## 2014-05-04 DIAGNOSIS — I495 Sick sinus syndrome: Secondary | ICD-10-CM

## 2014-05-04 DIAGNOSIS — Z950 Presence of cardiac pacemaker: Secondary | ICD-10-CM

## 2014-05-04 DIAGNOSIS — E785 Hyperlipidemia, unspecified: Secondary | ICD-10-CM | POA: Diagnosis not present

## 2014-05-04 DIAGNOSIS — I4891 Unspecified atrial fibrillation: Secondary | ICD-10-CM | POA: Diagnosis not present

## 2014-05-04 DIAGNOSIS — J449 Chronic obstructive pulmonary disease, unspecified: Secondary | ICD-10-CM | POA: Insufficient documentation

## 2014-05-04 DIAGNOSIS — M1990 Unspecified osteoarthritis, unspecified site: Secondary | ICD-10-CM | POA: Diagnosis not present

## 2014-05-04 DIAGNOSIS — Z45018 Encounter for adjustment and management of other part of cardiac pacemaker: Secondary | ICD-10-CM | POA: Insufficient documentation

## 2014-05-04 DIAGNOSIS — E039 Hypothyroidism, unspecified: Secondary | ICD-10-CM | POA: Diagnosis not present

## 2014-05-04 DIAGNOSIS — I951 Orthostatic hypotension: Secondary | ICD-10-CM | POA: Diagnosis not present

## 2014-05-04 DIAGNOSIS — J4489 Other specified chronic obstructive pulmonary disease: Secondary | ICD-10-CM | POA: Insufficient documentation

## 2014-05-04 DIAGNOSIS — E559 Vitamin D deficiency, unspecified: Secondary | ICD-10-CM | POA: Diagnosis not present

## 2014-05-04 DIAGNOSIS — I498 Other specified cardiac arrhythmias: Secondary | ICD-10-CM

## 2014-05-04 DIAGNOSIS — M949 Disorder of cartilage, unspecified: Secondary | ICD-10-CM

## 2014-05-04 DIAGNOSIS — G2581 Restless legs syndrome: Secondary | ICD-10-CM | POA: Diagnosis not present

## 2014-05-04 DIAGNOSIS — Z7901 Long term (current) use of anticoagulants: Secondary | ICD-10-CM | POA: Insufficient documentation

## 2014-05-04 DIAGNOSIS — M899 Disorder of bone, unspecified: Secondary | ICD-10-CM | POA: Insufficient documentation

## 2014-05-04 DIAGNOSIS — G4733 Obstructive sleep apnea (adult) (pediatric): Secondary | ICD-10-CM | POA: Insufficient documentation

## 2014-05-04 DIAGNOSIS — Z8673 Personal history of transient ischemic attack (TIA), and cerebral infarction without residual deficits: Secondary | ICD-10-CM | POA: Insufficient documentation

## 2014-05-04 HISTORY — PX: PACEMAKER GENERATOR CHANGE: SHX5481

## 2014-05-04 HISTORY — DX: Unspecified atrial fibrillation: I48.91

## 2014-05-04 LAB — SURGICAL PCR SCREEN
MRSA, PCR: NEGATIVE
Staphylococcus aureus: NEGATIVE

## 2014-05-04 LAB — PROTIME-INR
INR: 2.69 — AB (ref 0.00–1.49)
PROTHROMBIN TIME: 27.7 s — AB (ref 11.6–15.2)

## 2014-05-04 SURGERY — PACEMAKER GENERATOR CHANGE
Anesthesia: LOCAL

## 2014-05-04 MED ORDER — ONDANSETRON HCL 4 MG/2ML IJ SOLN: 4.0000 mg | Freq: Four times a day (QID) | INTRAMUSCULAR | Status: DC | PRN

## 2014-05-04 MED ORDER — SODIUM CHLORIDE 0.9 % IV SOLN
INTRAVENOUS | Status: DC
  Administered 2014-05-04: 14:00:00 via INTRAVENOUS

## 2014-05-04 MED ORDER — FENTANYL CITRATE 0.05 MG/ML IJ SOLN
INTRAMUSCULAR | Status: AC
  Filled 2014-05-04: qty 2

## 2014-05-04 MED ORDER — SODIUM CHLORIDE 0.9 % IV SOLN: INTRAVENOUS | Status: DC

## 2014-05-04 MED ORDER — ACETAMINOPHEN 325 MG PO TABS
325.0000 mg | ORAL_TABLET | ORAL | Status: DC | PRN
  Filled 2014-05-04: qty 2

## 2014-05-04 MED ORDER — LIDOCAINE HCL (PF) 1 % IJ SOLN
INTRAMUSCULAR | Status: AC
  Filled 2014-05-04: qty 60

## 2014-05-04 MED ORDER — MUPIROCIN 2 % EX OINT
TOPICAL_OINTMENT | Freq: Two times a day (BID) | CUTANEOUS | Status: DC
  Administered 2014-05-04: 1 via NASAL
  Filled 2014-05-04 (×2): qty 22

## 2014-05-04 MED ORDER — MIDAZOLAM HCL 5 MG/5ML IJ SOLN
INTRAMUSCULAR | Status: AC
  Filled 2014-05-04: qty 5

## 2014-05-04 NOTE — Interval H&P Note (Signed)
History and Physical Interval Note:  05/04/2014 3:13 PM  Paul Davies  has presented today for surgery, with the diagnosis of eol  The various methods of treatment have been discussed with the patient and family. After consideration of risks, benefits and other options for treatment, the patient has consented to  Procedure(s): PACEMAKER GENERATOR CHANGE (N/A) as a surgical intervention .  The patient's history has been reviewed, patient examined, no change in status, stable for surgery.  I have reviewed the patient's chart and labs.  Questions were answered to the patient's satisfaction.     Virl Axe

## 2014-05-04 NOTE — H&P (Signed)
History and Physical   Patient ID: Paul Davies, MRN: [id number], DOB: 04-Nov-1930   Date of Encounter: 05/04/2014, 3:11 PM  Primary Care Provider: Alesia Richards, MD Cardiologist: [facility] Electrophysiologist:  [facility]  Chief Complaint:   Pacer at [facility]  History of Present Illness: Paul Davies is a 80 y.o. male   With hx of tachybrady syndrome with pacer implanted 2006 now at [facility]  He has noted impaired exercise tolerance over the last months in conjunction with lightheadedness and vague chest pains; no edema    LVEF 60% echo 2006 No known CAD remote cath     Past Medical History  Diagnosis Date  . Stroke     twice  . Hyperlipidemia   . Hypertension   . OSA (obstructive sleep apnea)   . RLS (restless legs syndrome)   . Osteopenia   . Thyroid disease   . Vitamin D deficiency   . Pacemaker medtronic   . DJD (degenerative joint disease)   . Atherosclerosis of aorta 2010    noted on lumbar CT in 2010  . COPD (chronic obstructive pulmonary disease)     On CXR  . Sinoatrial node dysfunction   . Prediabetes   . Atrial fibrillation      Past Surgical History  Procedure Laterality Date  . Pacemaker insertion        Prior to Admission medications   Medication Sig Start Date End Date Taking? Authorizing Provider  cholecalciferol (VITAMIN D) 1000 UNITS tablet Take 2,000 Units by mouth daily.    Yes Historical Provider, MD  clonazePAM (KLONOPIN) 0.5 MG tablet Take 0.5 mg by mouth at bedtime.   Yes Historical Provider, MD  hydrocortisone 2.5 % cream Apply 1 application topically daily. To face   Yes Historical Provider, MD  levothyroxine (SYNTHROID, LEVOTHROID) 50 MCG tablet Take 50 mcg by mouth daily before breakfast.   Yes Historical Provider, MD  memantine (NAMENDA) 10 MG tablet Take 10 mg by mouth 2 (two) times daily.   Yes Historical Provider, MD  warfarin (COUMADIN) 6 MG tablet Take 3-9 mg by mouth daily. Take 1.5 tablets every Monday, Wednesday, and Friday.  Take 0.5 tablet every Tuesday, Thursday, Saturday and Sunday.   Yes Historical Provider, MD  tamsulosin (FLOMAX) 0.4 MG CAPS capsule Take 0.4 mg by mouth daily.    Historical Provider, MD      Allergies: Allergies  Allergen Reactions  . Bee Venom      Social History:  The patient  reports that he has never smoked. He has never used smokeless tobacco. He reports that he does not drink alcohol or use illicit drugs. lives with wife  Family History:  The patient's family history includes Heart attack in his father.   ROS:  Please see the history of present illness.     All other systems reviewed and negative.   Vital Signs: Blood pressure 113/86, pulse 64, temperature 97.7 F (36.5 C), temperature source Oral, resp. rate 18, height 5' 8.5" (1.74 m), weight 187 lb (84.823 kg), SpO2 97.00%.  PHYSICAL EXAM: General:  Well nourished, well developed, in no acute distress  HEENT: normal Lymph: no adenopathy Neck: no JVD Endocrine:  No thryomegaly Vascular: No carotid bruits; FA pulses 2+ bilaterally without bruits Cardiac:  normal S1, S2; RRR; no murmur Lungs:  clear to auscultation bilaterally, no wheezing, rhonchi or rales Abd: soft, nontender, no hepatomegaly Ext: no edema Musculoskeletal:  No deformities, BUE and BLE strength normal and equal Skin: warm and dry  Neuro:  CNs 2-12 intact, no focal abnormalities noted Psych:  Normal affect    EKG:    pending  Labs:   Lab Results  Component Value Date   WBC 6.4 05/01/2014   HGB 12.7* 05/01/2014   HCT 38.1* 05/01/2014   MCV 90.6 05/01/2014   PLT 164.0 05/01/2014      Recent Labs Lab 05/01/14 1037  NA 138  K 4.3  CL 109  CO2 25  BUN 22  CREATININE 1.1  CALCIUM 9.1  GLUCOSE 89    No results found for this basename: CKTOTAL, CKMB, TROPONINI,  in the last 72 hours  Lab Results  Component Value Date   CHOL 203* 03/19/2014   HDL 58 03/19/2014   LDLCALC 105* 03/19/2014   TRIG 199* 03/19/2014    No results found for this  basename: DDIMER     Radiology/Studies:   No results found.    ASSESSMENT AND PLAN:   1. Tachy brady syndrome 2. Medtronic pacer now at ERI 3. Weakness assoc with pacemaker syndrome following reversion   We have reviewed the benefits and risks of generator replacement.  These include but are not limited to lead fracture and infection.  The patient understands, agrees and is willing to proceed.     Drenda Freeze 05/04/2014 3:11 PM  Pager # [phone]

## 2014-05-04 NOTE — Discharge Instructions (Signed)
Pacemaker Battery Change, Care After  °Refer to this sheet in the next few weeks. These instructions provide you with information on caring for yourself after your procedure. Your health care provider may also give you more specific instructions. Your treatment has been planned according to current medical practices, but problems sometimes occur. Call your health care provider if you have any problems or questions after your procedure. °WHAT TO EXPECT AFTER THE PROCEDURE °After your procedure, it is typical to have the following sensations: °· Soreness at the pacemaker site. °HOME CARE INSTRUCTIONS  °· Keep the incision clean and dry. °· Unless advised otherwise, you may shower beginning 48 hours after your procedure. °· For the first week after the replacement, avoid stretching motions that pull at the incision site and avoid heavy exercise with the arm on the same side as the incision. °· Only take over-the-counter or prescription medicines for pain, discomfort, or fever as directed by your health care provider. °· Your health care provider will tell you when you will need to next test your pacemaker by telephone or when to return to the office for follow up for removal of stitches. °SEEK MEDICAL CARE IF:  °· You have pain at the incision site that is not relieved by over-the-counter or prescription medicine. °· There is drainage or pus from the incision site. °· There is swelling larger than a lime at the incision site. °· You develop red streaking that extends above or below the incision site. °· You feel brief, intermittent palpitations, lightheadedness, or any symptoms that you feel might be related to your heart. °SEEK IMMEDIATE MEDICAL CARE IF:  °· You experience chest pain that is different than the pain at the pacemaker site. °· Shortness of breath. °· Palpitations or irregular heart beat. °· Lightheadedness that does not go away quickly. °· Fainting. °· You have pain that gets worse and is not relieved by  medicine. °MAKE SURE YOU:  °· Understand these instructions. °· Will watch your condition. °· Will get help right away if you are not doing well or get worse. °Document Released: 08/30/2013 Document Reviewed: 05/24/2013 °[employer]® Patient Information ©2014 [employer]. ° °

## 2014-05-04 NOTE — CV Procedure (Addendum)
Preoperative diagnosis sinus node dysfunction Postoperative diagnosis same/   Procedure: Generator replacement  Pocket revision  Following informed consent the patient was brought to the electrophysiology laboratory in place of the fluoroscopic table in the supine position after routine prep and drape lidocaine was infiltrated in the region of the previous incision and carried down to later the device pocket using sharp dissection and electrocautery. The pocket was opened the device was freed up and was explanted.  Interrogation of the previously implanted ventricular lead Medtronic 5076  demonstrated an R wave of 14.9  millivolts., and impedance of 676 ohms, and a pacing threshold of 0.7 volts at 0.5 msec.    The previously implanted atrial lead Medtronic 5076 demonstrated a P-wave amplitude of 5.3 milllivolts  and impedance of  421 ohms, and a pacing threshold of 0.9 volts at  @ 0.86milliseconds.  The leads were inspected. The leads were then attached to a Medtronic  pulse generator, serial number [id number].  The pocket was revised to house the larger pulse generator leads were excavated from the lateral aspect of the pocket for this effort    The pocket was irrigated with antibiotic containing saline solution hemostasis was assured and the leads and the device were placed in the pocket. The wound was then closed in 2 layers in normal fashion.  The patient tolerated the procedure without apparent complication.  Virl Axe

## 2014-05-16 ENCOUNTER — Ambulatory Visit (INDEPENDENT_AMBULATORY_CARE_PROVIDER_SITE_OTHER): Payer: Medicare Other | Admitting: *Deleted

## 2014-05-16 ENCOUNTER — Encounter: Payer: Self-pay | Admitting: Internal Medicine

## 2014-05-16 DIAGNOSIS — I4891 Unspecified atrial fibrillation: Secondary | ICD-10-CM

## 2014-05-16 DIAGNOSIS — I480 Paroxysmal atrial fibrillation: Secondary | ICD-10-CM

## 2014-05-16 LAB — MDC_IDC_ENUM_SESS_TYPE_INCLINIC
Brady Statistic AP VP Percent: 1 %
Brady Statistic AS VP Percent: 34 %
Brady Statistic AS VS Percent: 5 %
Date Time Interrogation Session: 20150624104646
Lead Channel Impedance Value: 678 Ohm
Lead Channel Pacing Threshold Amplitude: 0.75 V
Lead Channel Sensing Intrinsic Amplitude: 22.4 mV
Lead Channel Setting Pacing Amplitude: 2.5 V
MDC IDC MSMT BATTERY IMPEDANCE: 100 Ohm
MDC IDC MSMT BATTERY REMAINING LONGEVITY: 152 mo
MDC IDC MSMT BATTERY VOLTAGE: 2.81 V
MDC IDC MSMT LEADCHNL RA IMPEDANCE VALUE: 442 Ohm
MDC IDC MSMT LEADCHNL RA SENSING INTR AMPL: 0.5 mV
MDC IDC MSMT LEADCHNL RV PACING THRESHOLD PULSEWIDTH: 0.4 ms
MDC IDC SET LEADCHNL RA PACING AMPLITUDE: 2 V
MDC IDC SET LEADCHNL RV PACING PULSEWIDTH: 0.4 ms
MDC IDC SET LEADCHNL RV SENSING SENSITIVITY: 5.6 mV
MDC IDC STAT BRADY AP VS PERCENT: 59 %

## 2014-05-16 NOTE — Progress Notes (Signed)
Wound check appointment. Wound without redness or edema. Incision edges approximated, wound well healed. Normal device function. Thresholds, sensing, and impedances consistent with implant measurements. Device programmed at appropriate safety margins. Histogram distribution appropriate for patient and level of activity. 9 mode switch episodes (7.5%)---5 AHR episodes---Presently in AF (since 6-23) + Warfarin. 6 high ventricular rates noted---max dur. 8 mins, Max V 213, Max Avg V 199. Patient educated about wound care, arm mobility, lifting restrictions. ROV in 3 months with SK.

## 2014-05-29 DIAGNOSIS — R5381 Other malaise: Secondary | ICD-10-CM | POA: Diagnosis not present

## 2014-05-29 DIAGNOSIS — E785 Hyperlipidemia, unspecified: Secondary | ICD-10-CM | POA: Diagnosis not present

## 2014-05-29 DIAGNOSIS — Z7901 Long term (current) use of anticoagulants: Secondary | ICD-10-CM | POA: Diagnosis not present

## 2014-05-29 DIAGNOSIS — I4891 Unspecified atrial fibrillation: Secondary | ICD-10-CM | POA: Diagnosis not present

## 2014-05-29 DIAGNOSIS — E039 Hypothyroidism, unspecified: Secondary | ICD-10-CM | POA: Diagnosis not present

## 2014-05-29 DIAGNOSIS — I951 Orthostatic hypotension: Secondary | ICD-10-CM | POA: Diagnosis not present

## 2014-05-29 DIAGNOSIS — Z950 Presence of cardiac pacemaker: Secondary | ICD-10-CM | POA: Diagnosis not present

## 2014-05-29 DIAGNOSIS — R5383 Other fatigue: Secondary | ICD-10-CM | POA: Diagnosis not present

## 2014-06-13 DIAGNOSIS — Z950 Presence of cardiac pacemaker: Secondary | ICD-10-CM | POA: Diagnosis not present

## 2014-06-13 DIAGNOSIS — R5383 Other fatigue: Secondary | ICD-10-CM | POA: Diagnosis not present

## 2014-06-13 DIAGNOSIS — E039 Hypothyroidism, unspecified: Secondary | ICD-10-CM | POA: Diagnosis not present

## 2014-06-13 DIAGNOSIS — E785 Hyperlipidemia, unspecified: Secondary | ICD-10-CM | POA: Diagnosis not present

## 2014-06-13 DIAGNOSIS — I4891 Unspecified atrial fibrillation: Secondary | ICD-10-CM | POA: Diagnosis not present

## 2014-06-13 DIAGNOSIS — I951 Orthostatic hypotension: Secondary | ICD-10-CM | POA: Diagnosis not present

## 2014-06-13 DIAGNOSIS — Z7901 Long term (current) use of anticoagulants: Secondary | ICD-10-CM | POA: Diagnosis not present

## 2014-06-13 DIAGNOSIS — R5381 Other malaise: Secondary | ICD-10-CM | POA: Diagnosis not present

## 2014-06-18 ENCOUNTER — Ambulatory Visit: Payer: Self-pay | Admitting: Physician Assistant

## 2014-06-18 ENCOUNTER — Other Ambulatory Visit: Payer: Self-pay | Admitting: Physician Assistant

## 2014-06-18 ENCOUNTER — Ambulatory Visit: Payer: Self-pay | Admitting: Internal Medicine

## 2014-06-18 MED ORDER — CLONAZEPAM 0.5 MG PO TABS: 0.5000 mg | ORAL_TABLET | Freq: Every day | ORAL | Status: DC

## 2014-06-27 DIAGNOSIS — Z7901 Long term (current) use of anticoagulants: Secondary | ICD-10-CM | POA: Diagnosis not present

## 2014-06-27 DIAGNOSIS — E039 Hypothyroidism, unspecified: Secondary | ICD-10-CM | POA: Diagnosis not present

## 2014-06-27 DIAGNOSIS — I4891 Unspecified atrial fibrillation: Secondary | ICD-10-CM | POA: Diagnosis not present

## 2014-06-27 DIAGNOSIS — R5381 Other malaise: Secondary | ICD-10-CM | POA: Diagnosis not present

## 2014-06-27 DIAGNOSIS — R5383 Other fatigue: Secondary | ICD-10-CM | POA: Diagnosis not present

## 2014-06-27 DIAGNOSIS — E785 Hyperlipidemia, unspecified: Secondary | ICD-10-CM | POA: Diagnosis not present

## 2014-06-27 DIAGNOSIS — I951 Orthostatic hypotension: Secondary | ICD-10-CM | POA: Diagnosis not present

## 2014-06-27 DIAGNOSIS — Z950 Presence of cardiac pacemaker: Secondary | ICD-10-CM | POA: Diagnosis not present

## 2014-07-27 DIAGNOSIS — I4891 Unspecified atrial fibrillation: Secondary | ICD-10-CM | POA: Diagnosis not present

## 2014-07-27 DIAGNOSIS — E039 Hypothyroidism, unspecified: Secondary | ICD-10-CM | POA: Diagnosis not present

## 2014-07-27 DIAGNOSIS — E785 Hyperlipidemia, unspecified: Secondary | ICD-10-CM | POA: Diagnosis not present

## 2014-07-27 DIAGNOSIS — I951 Orthostatic hypotension: Secondary | ICD-10-CM | POA: Diagnosis not present

## 2014-07-27 DIAGNOSIS — Z7901 Long term (current) use of anticoagulants: Secondary | ICD-10-CM | POA: Diagnosis not present

## 2014-07-27 DIAGNOSIS — Z950 Presence of cardiac pacemaker: Secondary | ICD-10-CM | POA: Diagnosis not present

## 2014-07-27 DIAGNOSIS — R5383 Other fatigue: Secondary | ICD-10-CM | POA: Diagnosis not present

## 2014-07-27 DIAGNOSIS — R5381 Other malaise: Secondary | ICD-10-CM | POA: Diagnosis not present

## 2014-08-02 ENCOUNTER — Encounter: Payer: Self-pay | Admitting: Internal Medicine

## 2014-08-02 ENCOUNTER — Ambulatory Visit (INDEPENDENT_AMBULATORY_CARE_PROVIDER_SITE_OTHER): Payer: Medicare Other | Admitting: Internal Medicine

## 2014-08-02 VITALS — BP 92/56 | HR 79 | Ht 68.5 in | Wt 172.0 lb

## 2014-08-02 DIAGNOSIS — I495 Sick sinus syndrome: Secondary | ICD-10-CM | POA: Diagnosis not present

## 2014-08-02 DIAGNOSIS — I2510 Atherosclerotic heart disease of native coronary artery without angina pectoris: Secondary | ICD-10-CM

## 2014-08-02 DIAGNOSIS — Z950 Presence of cardiac pacemaker: Secondary | ICD-10-CM

## 2014-08-02 NOTE — Progress Notes (Signed)
      Patient Care Team: Unk Pinto, MD as PCP - General (Internal Medicine) Jacolyn Reedy, MD as Consulting Physician (Cardiology) Bernestine Amass, MD as Consulting Physician (Urology) Antony Contras, MD as Consulting Physician (Neurology)   HPI  Paul Davies is a 80 y.o. male Seen in followup for pacemaker implanted for sinus node dysfunction and underwent generator revision by me 6/15. He been previously seen by [name]. He is followed regularly by [name]   Past Medical History  Diagnosis Date  . Stroke     twice  . Hyperlipidemia   . Hypertension   . OSA (obstructive sleep apnea)   . RLS (restless legs syndrome)   . Osteopenia   . Thyroid disease   . Vitamin D deficiency   . Pacemaker medtronic   . DJD (degenerative joint disease)   . Atherosclerosis of aorta 2010    noted on lumbar CT in 2010  . COPD (chronic obstructive pulmonary disease)     On CXR  . Sinoatrial node dysfunction   . Prediabetes   . Atrial fibrillation     Past Surgical History  Procedure Laterality Date  . Pacemaker insertion      Current Outpatient Prescriptions  Medication Sig Dispense Refill  . cholecalciferol (VITAMIN D) 1000 UNITS tablet Take 2,000 Units by mouth daily.       . clonazePAM (KLONOPIN) 0.5 MG tablet Take 1 tablet (0.5 mg total) by mouth at bedtime.  30 tablet  0  . hydrocortisone 2.5 % cream Apply 1 application topically daily. To face      . levothyroxine (SYNTHROID, LEVOTHROID) 50 MCG tablet Take 50 mcg by mouth daily before breakfast.      . memantine (NAMENDA) 10 MG tablet Take 10 mg by mouth 2 (two) times daily.      . tamsulosin (FLOMAX) 0.4 MG CAPS capsule Take 0.4 mg by mouth daily.      Marland Kitchen warfarin (COUMADIN) 6 MG tablet Take 3-9 mg by mouth daily. Take 1.5 tablets every Monday, Wednesday, and Friday. Take 0.5 tablet every Tuesday, Thursday, Saturday and Sunday.       No current facility-administered medications for this visit.    Allergies    Allergen Reactions  . Bee Venom     Review of Systems negative except from HPI and PMH  Physical Exam BP 92/56  Pulse 79  Ht 5' 8.5" (1.74 m)  Wt 172 lb (78.019 kg)  BMI 25.77 kg/m2 Well developed and well nourished in no acute distress HENT normal E scleral and icterus clear Neck Supple JVP flat; carotids brisk and full Clear to ausculation Device pocket well healed; without hematoma or erythema.  There is no tethering Regular rate and rhythm, no murmurs gallops or rub Soft with active bowel sounds No clubbing cyanosis  Edema Alert and oriented, grossly normal motor and sensory function Skin Warm and Dry    Assessment and  Plan  Sinus node dysfunction paced his lower rate limit  Atrial fibrillation he is on Coumadin  Pacemaker-Medtronic we have reprogrammed his device DDD-MVP so as to avoid tracking and pacing of his bigeminy. Will allow intrinsic conduction.  Atrial bigeminy As above

## 2014-08-06 ENCOUNTER — Encounter: Payer: Self-pay | Admitting: Internal Medicine

## 2014-08-10 DIAGNOSIS — I951 Orthostatic hypotension: Secondary | ICD-10-CM | POA: Diagnosis not present

## 2014-08-10 DIAGNOSIS — R5381 Other malaise: Secondary | ICD-10-CM | POA: Diagnosis not present

## 2014-08-10 DIAGNOSIS — E039 Hypothyroidism, unspecified: Secondary | ICD-10-CM | POA: Diagnosis not present

## 2014-08-10 DIAGNOSIS — I4891 Unspecified atrial fibrillation: Secondary | ICD-10-CM | POA: Diagnosis not present

## 2014-08-10 DIAGNOSIS — Z950 Presence of cardiac pacemaker: Secondary | ICD-10-CM | POA: Diagnosis not present

## 2014-08-10 DIAGNOSIS — Z7901 Long term (current) use of anticoagulants: Secondary | ICD-10-CM | POA: Diagnosis not present

## 2014-08-10 DIAGNOSIS — E785 Hyperlipidemia, unspecified: Secondary | ICD-10-CM | POA: Diagnosis not present

## 2014-08-17 DIAGNOSIS — I4891 Unspecified atrial fibrillation: Secondary | ICD-10-CM | POA: Diagnosis not present

## 2014-08-23 DIAGNOSIS — I951 Orthostatic hypotension: Secondary | ICD-10-CM | POA: Diagnosis not present

## 2014-08-23 DIAGNOSIS — I482 Chronic atrial fibrillation: Secondary | ICD-10-CM | POA: Diagnosis not present

## 2014-08-23 DIAGNOSIS — E784 Other hyperlipidemia: Secondary | ICD-10-CM | POA: Diagnosis not present

## 2014-08-23 DIAGNOSIS — Z7901 Long term (current) use of anticoagulants: Secondary | ICD-10-CM | POA: Diagnosis not present

## 2014-08-23 DIAGNOSIS — E039 Hypothyroidism, unspecified: Secondary | ICD-10-CM | POA: Diagnosis not present

## 2014-08-23 DIAGNOSIS — Z950 Presence of cardiac pacemaker: Secondary | ICD-10-CM | POA: Diagnosis not present

## 2014-08-23 DIAGNOSIS — R531 Weakness: Secondary | ICD-10-CM | POA: Diagnosis not present

## 2014-08-25 DIAGNOSIS — E784 Other hyperlipidemia: Secondary | ICD-10-CM | POA: Diagnosis not present

## 2014-08-25 DIAGNOSIS — I951 Orthostatic hypotension: Secondary | ICD-10-CM | POA: Diagnosis not present

## 2014-08-25 DIAGNOSIS — Z7901 Long term (current) use of anticoagulants: Secondary | ICD-10-CM | POA: Diagnosis not present

## 2014-08-25 DIAGNOSIS — Z950 Presence of cardiac pacemaker: Secondary | ICD-10-CM | POA: Diagnosis not present

## 2014-08-25 DIAGNOSIS — E039 Hypothyroidism, unspecified: Secondary | ICD-10-CM | POA: Diagnosis not present

## 2014-08-25 DIAGNOSIS — R531 Weakness: Secondary | ICD-10-CM | POA: Diagnosis not present

## 2014-08-25 DIAGNOSIS — I482 Chronic atrial fibrillation: Secondary | ICD-10-CM | POA: Diagnosis not present

## 2014-08-30 DIAGNOSIS — I482 Chronic atrial fibrillation: Secondary | ICD-10-CM | POA: Diagnosis not present

## 2014-08-30 DIAGNOSIS — Z950 Presence of cardiac pacemaker: Secondary | ICD-10-CM | POA: Diagnosis not present

## 2014-08-30 DIAGNOSIS — I951 Orthostatic hypotension: Secondary | ICD-10-CM | POA: Diagnosis not present

## 2014-08-30 DIAGNOSIS — E784 Other hyperlipidemia: Secondary | ICD-10-CM | POA: Diagnosis not present

## 2014-08-30 DIAGNOSIS — R531 Weakness: Secondary | ICD-10-CM | POA: Diagnosis not present

## 2014-08-30 DIAGNOSIS — E039 Hypothyroidism, unspecified: Secondary | ICD-10-CM | POA: Diagnosis not present

## 2014-08-30 DIAGNOSIS — Z7901 Long term (current) use of anticoagulants: Secondary | ICD-10-CM | POA: Diagnosis not present

## 2014-09-06 ENCOUNTER — Ambulatory Visit (INDEPENDENT_AMBULATORY_CARE_PROVIDER_SITE_OTHER): Payer: Medicare Other | Admitting: Nurse Practitioner

## 2014-09-06 ENCOUNTER — Encounter: Payer: Self-pay | Admitting: Nurse Practitioner

## 2014-09-06 VITALS — BP 108/80 | HR 58 | Ht 68.0 in | Wt 171.0 lb

## 2014-09-06 DIAGNOSIS — G309 Alzheimer's disease, unspecified: Secondary | ICD-10-CM | POA: Diagnosis not present

## 2014-09-06 DIAGNOSIS — I2510 Atherosclerotic heart disease of native coronary artery without angina pectoris: Secondary | ICD-10-CM

## 2014-09-06 DIAGNOSIS — F0280 Dementia in other diseases classified elsewhere without behavioral disturbance: Secondary | ICD-10-CM

## 2014-09-06 MED ORDER — MEMANTINE HCL 10 MG PO TABS: 10.0000 mg | ORAL_TABLET | Freq: Two times a day (BID) | ORAL | Status: DC

## 2014-09-06 NOTE — Progress Notes (Signed)
I agree with the above plan 

## 2014-09-06 NOTE — Patient Instructions (Signed)
Memory score is stable Continue Namenda 10 mg twice daily Continue Coumadin for your atrial fibrillation and stroke prevention Followup in 6 months or sooner if problems arise

## 2014-09-06 NOTE — Progress Notes (Signed)
[facility]  PATIENT: Paul Davies DOB: [birth year]   REASON FOR VISIT: Followup for memory loss   HISTORY OF PRESENT ILLNESS:Paul Davies, 80 year Caucasian male with long-standing history of mild dementia as well as remote right MCA branch infarct secondary to cardiac embolism from atrial fibrillation in June 2006 who is seen today for followup after last visit on 03/06/14 with Dr. Leonie Man. The patient is unaccompanied today and he states his memory is fine, however he  has trouble remembering recent conversations and events. He however still remains quite independent in activities of daily living. He is quite active planted his tomato garden and mowes his yard with his tractor. He has no issues with agitation, delusions, hallucinations. He does go out independently and has never gotten lost. He has no issues with gait and has good balance and no falls He has been on Namenda 10 mg twice daily for several years. He has no new complaints today.    REVIEW OF SYSTEMS: Full 14 system review of systems performed and notable only for those listed, all others are neg:  Constitutional: N/A  Cardiovascular: N/A  Ear/Nose/Throat: hearing loss Skin: N/A  Eyes: N/A  Respiratory: cough Gastroitestinal: N/A  Hematology/Lymphatic: N/A  Endocrine: N/A Musculoskeletal:N/A  Allergy/Immunology: N/A  Neurological: Memory loss  Psychiatric: N/A Sleep : NA   ALLERGIES: Allergies  Allergen Reactions  . Bee Venom     HOME MEDICATIONS: Outpatient Prescriptions Prior to Visit  Medication Sig Dispense Refill  . cholecalciferol (VITAMIN D) 1000 UNITS tablet Take 2,000 Units by mouth daily.       . clonazePAM (KLONOPIN) 0.5 MG tablet Take 1 tablet (0.5 mg total) by mouth at bedtime.  30 tablet  0  . hydrocortisone 2.5 % cream Apply 1 application topically daily. To face      . levothyroxine (SYNTHROID, LEVOTHROID) 50 MCG tablet Take 50 mcg by mouth daily before breakfast.      .  memantine (NAMENDA) 10 MG tablet Take 10 mg by mouth 2 (two) times daily.      . tamsulosin (FLOMAX) 0.4 MG CAPS capsule Take 0.4 mg by mouth daily.      Marland Kitchen warfarin (COUMADIN) 6 MG tablet Take 3-9 mg by mouth daily. Take 1.5 tablets every Monday, Wednesday, and Friday. Take 0.5 tablet every Tuesday, Thursday, Saturday and Sunday.       No facility-administered medications prior to visit.    PAST MEDICAL HISTORY: Past Medical History  Diagnosis Date  . Stroke     twice  . Hyperlipidemia   . Hypertension   . OSA (obstructive sleep apnea)   . RLS (restless legs syndrome)   . Osteopenia   . Thyroid disease   . Vitamin D deficiency   . Pacemaker medtronic   . DJD (degenerative joint disease)   . Atherosclerosis of aorta 2010    noted on lumbar CT in 2010  . COPD (chronic obstructive pulmonary disease)     On CXR  . Sinoatrial node dysfunction   . Prediabetes   . Atrial fibrillation     PAST SURGICAL HISTORY: Past Surgical History  Procedure Laterality Date  . Pacemaker insertion      FAMILY HISTORY: Family History  Problem Relation Age of Onset  . Heart attack Father     SOCIAL HISTORY: History   Social History  . Marital Status: Married    Spouse Name: N/A    Number of Children: 60  . Years of Education: 67  Occupational History  . Retired    Social History Main Topics  . Smoking status: Never Smoker   . Smokeless tobacco: Never Used  . Alcohol Use: No  . Drug Use: No  . Sexual Activity: Not on file   Other Topics Concern  . Not on file   Social History Narrative   Patient lives at home with his wife    Patient drinks coffee daily     PHYSICAL EXAM  Filed Vitals:   09/06/14 1425  BP: 108/80  Pulse: 58  Height: _0  (1.727 m)  Weight: 171 lb (77.565 kg)   Body mass index is 26.01 kg/(m^2). General: well developed, well nourished elderly Caucasian male, seated, in no evident distress  Head: head normocephalic and atraumatic. Orohparynx  benign  Neck: supple with no carotid or supraclavicular bruits  Musculoskeletal: no deformity  Skin: no rash/petichiae  Vascular: Normal pulses all extremities  Neurologic Exam  Mental Status: Awake and fully alert. Oriented to place and time. Recent and remote memory diminished. Mini-Mental status exam score 27/30 with deficits in recall, repetition and copying a design.  Animal naming test 15. Clock drawing 3/4. Geriatric depression scale 2  not depressed. Attention span, concentration and fund of knowledge appropriate. Mood and affect appropriate.  Cranial Nerves: Fundoscopic exam reveals sharp disc margins. Pupils equal, briskly reactive to light. Extraocular movements full without nystagmus. Visual fields full to confrontation. Hearing intact. Facial sensation intact. Face, tongue, palate moves normally and symmetrically.  Motor: Normal bulk and tone. Normal strength in all tested extremity muscles.  Sensory.: intact to touch and pinprick and vibratory sensation.  Coordination: Rapid alternating movements normal in all extremities. Finger-to-nose and heel-to-shin performed accurately bilaterally.  Gait and Station: Arises from chair without difficulty. Stance is stooped. Gait demonstrates short stride length . unable to heel, toe and tandem walk without difficulty.  Reflexes: 1+ and symmetric. Toes downgoing.    DIAGNOSTIC DATA (LABS, IMAGING, TESTING) - I reviewed patient records, labs, notes, testing and imaging myself where available.  Lab Results  Component Value Date   WBC 6.4 05/01/2014   HGB 12.7* 05/01/2014   HCT 38.1* 05/01/2014   MCV 90.6 05/01/2014   PLT 164.0 05/01/2014      Component Value Date/Time   NA 138 05/01/2014 1037   K 4.3 05/01/2014 1037   CL 109 05/01/2014 1037   CO2 25 05/01/2014 1037   GLUCOSE 89 05/01/2014 1037   BUN 22 05/01/2014 1037   CREATININE 1.1 05/01/2014 1037   CREATININE 0.98 03/19/2014 1244   CALCIUM 9.1 05/01/2014 1037   PROT 6.5 03/19/2014 1244   ALBUMIN 4.0  03/19/2014 1244   AST 15 03/19/2014 1244   ALT 10 03/19/2014 1244   ALKPHOS 60 03/19/2014 1244   BILITOT 0.4 03/19/2014 1244   GFRNONAA 72 03/19/2014 1244   GFRNONAA >60 02/21/2008 1206   GFRAA 83 03/19/2014 1244   GFRAA  Value: >60        The eGFR has been calculated using the MDRD equation. This calculation has not been validated in all clinical 02/21/2008 1206   Lab Results  Component Value Date   CHOL 203* 03/19/2014   HDL 58 03/19/2014   LDLCALC 105* 03/19/2014   TRIG 199* 03/19/2014   CHOLHDL 3.5 03/19/2014   Lab Results  Component Value Date   HGBA1C 6.0* 03/19/2014    Lab Results  Component Value Date   TSH 1.585 03/19/2014   ASSESSMENT AND PLAN  81 y.o. year old  male  has a past medical history of mild dementia with remote history of right MCA infarct secondary to cardiogenic embolism in June 2006 from atrial fibrillation which is stable.  Memory score is stable 27/30 with deficits in recall, repetition and copying a design.  Animal naming test 15. Clock drawing 3/4. Geriatric depression scale 2  not depressed Continue Namenda 10 mg twice daily Continue Coumadin for your atrial fibrillation and stroke prevention Followup in 6 months or sooner if problems arise Dennie Bible, [facility], [facility], APRN  [facility] [address], [place], [place] [address] [phone]

## 2014-09-18 ENCOUNTER — Encounter: Payer: Self-pay | Admitting: Physician Assistant

## 2014-09-18 ENCOUNTER — Ambulatory Visit: Payer: Self-pay | Admitting: Emergency Medicine

## 2014-09-18 ENCOUNTER — Ambulatory Visit: Payer: Self-pay | Admitting: Internal Medicine

## 2014-09-18 ENCOUNTER — Ambulatory Visit (INDEPENDENT_AMBULATORY_CARE_PROVIDER_SITE_OTHER): Payer: Medicare Other | Admitting: Physician Assistant

## 2014-09-18 VITALS — BP 130/70 | HR 84 | Temp 97.5°F | Resp 16 | Ht 68.0 in | Wt 173.0 lb

## 2014-09-18 DIAGNOSIS — N320 Bladder-neck obstruction: Secondary | ICD-10-CM

## 2014-09-18 DIAGNOSIS — I480 Paroxysmal atrial fibrillation: Secondary | ICD-10-CM | POA: Diagnosis not present

## 2014-09-18 DIAGNOSIS — Z0001 Encounter for general adult medical examination with abnormal findings: Secondary | ICD-10-CM

## 2014-09-18 DIAGNOSIS — Z79899 Other long term (current) drug therapy: Secondary | ICD-10-CM | POA: Diagnosis not present

## 2014-09-18 DIAGNOSIS — Z9989 Dependence on other enabling machines and devices: Secondary | ICD-10-CM

## 2014-09-18 DIAGNOSIS — E785 Hyperlipidemia, unspecified: Secondary | ICD-10-CM | POA: Diagnosis not present

## 2014-09-18 DIAGNOSIS — R05 Cough: Secondary | ICD-10-CM

## 2014-09-18 DIAGNOSIS — I10 Essential (primary) hypertension: Secondary | ICD-10-CM

## 2014-09-18 DIAGNOSIS — R059 Cough, unspecified: Secondary | ICD-10-CM

## 2014-09-18 DIAGNOSIS — E559 Vitamin D deficiency, unspecified: Secondary | ICD-10-CM

## 2014-09-18 DIAGNOSIS — M8580 Other specified disorders of bone density and structure, unspecified site: Secondary | ICD-10-CM

## 2014-09-18 DIAGNOSIS — Z8673 Personal history of transient ischemic attack (TIA), and cerebral infarction without residual deficits: Secondary | ICD-10-CM | POA: Diagnosis not present

## 2014-09-18 DIAGNOSIS — Z1331 Encounter for screening for depression: Secondary | ICD-10-CM

## 2014-09-18 DIAGNOSIS — I951 Orthostatic hypotension: Secondary | ICD-10-CM | POA: Diagnosis not present

## 2014-09-18 DIAGNOSIS — E784 Other hyperlipidemia: Secondary | ICD-10-CM | POA: Diagnosis not present

## 2014-09-18 DIAGNOSIS — R531 Weakness: Secondary | ICD-10-CM | POA: Diagnosis not present

## 2014-09-18 DIAGNOSIS — G309 Alzheimer's disease, unspecified: Secondary | ICD-10-CM

## 2014-09-18 DIAGNOSIS — R6889 Other general symptoms and signs: Secondary | ICD-10-CM

## 2014-09-18 DIAGNOSIS — Z23 Encounter for immunization: Secondary | ICD-10-CM

## 2014-09-18 DIAGNOSIS — G4733 Obstructive sleep apnea (adult) (pediatric): Secondary | ICD-10-CM

## 2014-09-18 DIAGNOSIS — Z7901 Long term (current) use of anticoagulants: Secondary | ICD-10-CM | POA: Diagnosis not present

## 2014-09-18 DIAGNOSIS — R7303 Prediabetes: Secondary | ICD-10-CM

## 2014-09-18 DIAGNOSIS — E039 Hypothyroidism, unspecified: Secondary | ICD-10-CM | POA: Diagnosis not present

## 2014-09-18 DIAGNOSIS — I482 Chronic atrial fibrillation, unspecified: Secondary | ICD-10-CM

## 2014-09-18 DIAGNOSIS — F0280 Dementia in other diseases classified elsewhere without behavioral disturbance: Secondary | ICD-10-CM

## 2014-09-18 DIAGNOSIS — R296 Repeated falls: Secondary | ICD-10-CM | POA: Diagnosis not present

## 2014-09-18 DIAGNOSIS — Z950 Presence of cardiac pacemaker: Secondary | ICD-10-CM | POA: Diagnosis not present

## 2014-09-18 DIAGNOSIS — R7309 Other abnormal glucose: Secondary | ICD-10-CM | POA: Diagnosis not present

## 2014-09-18 DIAGNOSIS — I6789 Other cerebrovascular disease: Secondary | ICD-10-CM

## 2014-09-18 DIAGNOSIS — Z9181 History of falling: Secondary | ICD-10-CM

## 2014-09-18 LAB — BASIC METABOLIC PANEL WITH GFR
BUN: 12 mg/dL (ref 6–23)
CHLORIDE: 103 meq/L (ref 96–112)
CO2: 24 meq/L (ref 19–32)
CREATININE: 0.95 mg/dL (ref 0.50–1.35)
Calcium: 9.2 mg/dL (ref 8.4–10.5)
GFR, Est African American: 86 mL/min
GFR, Est Non African American: 74 mL/min
GLUCOSE: 91 mg/dL (ref 70–99)
Potassium: 4.3 mEq/L (ref 3.5–5.3)
Sodium: 138 mEq/L (ref 135–145)

## 2014-09-18 LAB — HEPATIC FUNCTION PANEL
ALK PHOS: 58 U/L (ref 39–117)
ALT: 9 U/L (ref 0–53)
AST: 13 U/L (ref 0–37)
Albumin: 4.1 g/dL (ref 3.5–5.2)
Bilirubin, Direct: 0.1 mg/dL (ref 0.0–0.3)
Indirect Bilirubin: 0.4 mg/dL (ref 0.2–1.2)
TOTAL PROTEIN: 6.8 g/dL (ref 6.0–8.3)
Total Bilirubin: 0.5 mg/dL (ref 0.2–1.2)

## 2014-09-18 LAB — CBC WITH DIFFERENTIAL/PLATELET
Basophils Absolute: 0 10*3/uL (ref 0.0–0.1)
Basophils Relative: 0 % (ref 0–1)
EOS PCT: 4 % (ref 0–5)
Eosinophils Absolute: 0.2 10*3/uL (ref 0.0–0.7)
HCT: 39.9 % (ref 39.0–52.0)
Hemoglobin: 13.6 g/dL (ref 13.0–17.0)
LYMPHS ABS: 1.7 10*3/uL (ref 0.7–4.0)
Lymphocytes Relative: 28 % (ref 12–46)
MCH: 30.2 pg (ref 26.0–34.0)
MCHC: 34.1 g/dL (ref 30.0–36.0)
MCV: 88.7 fL (ref 78.0–100.0)
MONO ABS: 0.5 10*3/uL (ref 0.1–1.0)
MONOS PCT: 9 % (ref 3–12)
Neutro Abs: 3.5 10*3/uL (ref 1.7–7.7)
Neutrophils Relative %: 59 % (ref 43–77)
PLATELETS: 175 10*3/uL (ref 150–400)
RBC: 4.5 MIL/uL (ref 4.22–5.81)
RDW: 14.5 % (ref 11.5–15.5)
WBC: 6 10*3/uL (ref 4.0–10.5)

## 2014-09-18 LAB — LIPID PANEL
Cholesterol: 190 mg/dL (ref 0–200)
HDL: 61 mg/dL (ref >39)
LDL Cholesterol: 91 mg/dL (ref 0–99)
Total CHOL/HDL Ratio: 3.1 Ratio
Triglycerides: 188 mg/dL — ABNORMAL HIGH (ref <150)
VLDL: 38 mg/dL (ref 0–40)

## 2014-09-18 LAB — HEMOGLOBIN A1C
HEMOGLOBIN A1C: 5.8 % — AB (ref <5.7)
Mean Plasma Glucose: 120 mg/dL — ABNORMAL HIGH (ref <117)

## 2014-09-18 LAB — MAGNESIUM: Magnesium: 1.9 mg/dL (ref 1.5–2.5)

## 2014-09-18 LAB — TSH: TSH: 2.793 u[IU]/mL (ref 0.350–4.500)

## 2014-09-18 MED ORDER — LEVOTHYROXINE SODIUM 50 MCG PO TABS: 50.0000 ug | ORAL_TABLET | Freq: Every day | ORAL | Status: DC

## 2014-09-18 MED ORDER — CLONAZEPAM 0.5 MG PO TABS: 0.5000 mg | ORAL_TABLET | Freq: Every day | ORAL | Status: DC

## 2014-09-18 NOTE — Progress Notes (Signed)
MEDICARE ANNUAL WELLNESS VISIT AND FOLLOW UP Assessment:   1. Essential hypertension - CBC with Differential - BASIC METABOLIC PANEL WITH GFR - Hepatic function panel - TSH  2. OSA on CPAP Get back on CPAP  3. Prediabetes Discussed general issues about diabetes pathophysiology and management., Educational material distributed., Suggested low cholesterol diet., Encouraged aerobic exercise., Discussed foot care., Reminded to get yearly retinal exam. - Hemoglobin A1c - HM DIABETES FOOT EXAM  4. Alzheimer's disease Continue follow up neuro  5. BPH Continue follow up Dr. Gaynelle Arabian, get back on flomax  6. Cardiac pacemaker in situ Battery recently replaced, Continue cardio follow up  7. Encounter for long-term (current) use of medications - Magnesium  8. Hyperlipidemia - Lipid panel  9. Vitamin D deficiency Continue supplement  10. C V A / STROKE Continue coumadin  11. Chronic atrial fibrillation Continue cardio follow up  12. Cough ? GERD versus post nasal drip- no SOB/wheezing/lungs CTAB- try nexium samples x 10 days, if not better follow up  13. Need for prophylactic vaccination and inoculation against influenza - Flu vaccine HIGH DOSE PF  14. Decreased bone density - DG Bone Density; Future  15. Encounter for general adult medical examination with abnormal findings  16. Screening for depression negative  17. At high risk for falls Declines PT, get DEXA    Plan:   During the course of the visit the patient was educated and counseled about appropriate screening and preventive services including:    Pneumococcal vaccine   Influenza vaccine  Td vaccine  Screening electrocardiogram  Colorectal cancer screening  Diabetes screening  Glaucoma screening  Nutrition counseling   Screening recommendations, referrals: Vaccinations: Please see documentation below and orders this visit.  Nutrition assessed and recommended  Colonoscopy  uptodate Recommended yearly ophthalmology/optometry visit for glaucoma screening and checkup Recommended yearly dental visit for hygiene and checkup Advanced directives - requested DEXA ordered  Conditions/risks identified: BMI: Discussed weight loss, diet, and increase physical activity.  Increase physical activity: AHA recommends 150 minutes of physical activity a week.  Medications reviewed Diabetes is at goal, ACE/ARB therapy: No, Reason not on Ace Inhibitor/ARB therapy:  predm Urinary Incontinence is not an issue: discussed non pharmacology and pharmacology options.  Fall risk: moderate- discussed PT, home fall assessment, medications.    Subjective:  Paul Davies is a 80 y.o. male who presents for Medicare Annual Wellness Visit and 3 month follow up for HTN, hyperlipidemia, prediabetes, and vitamin D Def.  Date of last medicare wellness visit was is unknown.  His blood pressure has been controlled at home, today their BP is BP: 130/70 mmHg He does not workout, but he is very active. He denies chest pain, shortness of breath, dizziness.  He is not on cholesterol medication and denies myalgias. His cholesterol is not at goal. The cholesterol last visit was:   Lab Results  Component Value Date   CHOL 203* 03/19/2014   HDL 58 03/19/2014   LDLCALC 105* 03/19/2014   TRIG 199* 03/19/2014   CHOLHDL 3.5 03/19/2014   He has been working on diet and exercise for prediabetes, and denies paresthesia of the feet, polydipsia and polyuria. Last A1C in the office was:  Lab Results  Component Value Date   HGBA1C 6.0* 03/19/2014   Patient is on Vitamin D supplement.   Lab Results  Component Value Date   VD25OH 82 03/19/2014     He is on thyroid medication. His medication was not changed last visit. Patient denies  heat / cold intolerance, nervousness and palpitations.  Lab Results  Component Value Date   TSH 1.585 03/19/2014  .  He is on klonopin and gabapentin for his RLS and on CPAP at  night and states this helps but 1 week ago his CPAP broke, he is working on getting a new one.  He is on Coumadin for Afib and sees [name], his last Coumadin was normal, he also has a pacemaker, he has to go back in 2 weeks to adjust coumadin. He denies any abnormal bleeding. He sees Dr. Leonie Man for early dementia, last MMSE 27/30, he is on Namenda twice daily.  He states he has fallen in the past year, no fractures, and that he trips. Needs DEXA  Complains of chronic cough, worse after food, occ chokes on foods. Denies SOB, wheezing, + drainage.  He has constipation.   Names of Other Physician/Practitioners you currently use: 1. [facility] here for primary care 2. Dr. Frederico Hamman , eye doctor, last visit Dec 2014 due this Dec 3. Dr. Carman Ching dentist, last visit q 6 months Patient Care Team: Unk Pinto, MD as PCP - General (Internal Medicine) Jacolyn Reedy, MD as Consulting Physician (Cardiology) Bernestine Amass, MD as Consulting Physician (Urology) Antony Contras, MD as Consulting Physician (Neurology)  Medication Review: Current Outpatient Prescriptions on File Prior to Visit  Medication Sig Dispense Refill  . cholecalciferol (VITAMIN D) 1000 UNITS tablet Take 2,000 Units by mouth daily.       . clonazePAM (KLONOPIN) 0.5 MG tablet Take 1 tablet (0.5 mg total) by mouth at bedtime.  30 tablet  0  . hydrocortisone 2.5 % cream Apply 1 application topically daily. To face      . levothyroxine (SYNTHROID, LEVOTHROID) 50 MCG tablet Take 50 mcg by mouth daily before breakfast.      . memantine (NAMENDA) 10 MG tablet Take 1 tablet (10 mg total) by mouth 2 (two) times daily.  60 tablet  6  . tamsulosin (FLOMAX) 0.4 MG CAPS capsule Take 0.4 mg by mouth daily.      Marland Kitchen warfarin (COUMADIN) 6 MG tablet Take 3-9 mg by mouth daily. Take 1.5 tablets every Monday, Wednesday, and Friday. Take 0.5 tablet every Tuesday, Thursday, Saturday and Sunday.       No current  facility-administered medications on file prior to visit.    Current Problems (verified) Patient Active Problem List   Diagnosis Date Noted  . Cardiac pacemaker in situ   . Encounter for long-term (current) use of medications 03/19/2014  . OSA on CPAP 03/19/2014  . Essential hypertension 03/19/2014  . ASHD w/Pacemaker 03/19/2014  . Alzheimer's disease 03/06/2014  . Hyperlipidemia   . Vitamin D deficiency   . Prediabetes   . C V A / STROKE 09/05/2010  . Atrial fibrillation 12/21/2008  . BPH 12/20/2008    Screening Tests Health Maintenance  Topic Date Due  . Zostavax  04/15/1992  . Influenza Vaccine  06/23/2014  . Colonoscopy  11/23/2018  . Tetanus/tdap  12/08/2022  . Pneumococcal Polysaccharide Vaccine Age 38 And Over  Completed    Immunization History  Administered Date(s) Administered  . Influenza-Unspecified 10/04/2013  . Pneumococcal Polysaccharide-23 10/17/2013  . Td 12/08/2012   Tetanus: 2014  Pneumovax: 2014  Prevnar: DUE Flu vaccine:at [facility] add High Dose flu 09/2013.  Zostavax: due cost DEXA: 08/2012 osteopenia DUE   Colonoscopy: 01/15/2009 Dr. Ardis Hughs.  EGD: N/A  CXR 01/2013 COPD changes   History reviewed:  allergies, current medications, past family history, past medical history, past social history, past surgical history and problem list   Risk Factors: Tobacco History  Substance Use Topics  . Smoking status: Never Smoker   . Smokeless tobacco: Never Used  . Alcohol Use: No   He does not smoke.  Patient is not a former smoker. Are there smokers in your home (other than you)?  No  Alcohol Current alcohol use: none  Caffeine Current caffeine use: coffee 1 /day  Exercise Current exercise: walking  Nutrition/Diet Current diet: in general, a "healthy" diet    Cardiac risk factors: advanced age (older than 5 for men, 49 for women), dyslipidemia, family history of premature cardiovascular disease, hypertension, male gender, obesity (BMI >= 30  kg/m2) and sedentary lifestyle.  Depression Screen (Note: if answer to either of the following is "Yes", a more complete depression screening is indicated)   Q1: Over the past two weeks, have you felt down, depressed or hopeless? No  Q2: Over the past two weeks, have you felt little interest or pleasure in doing things? No  Have you lost interest or pleasure in daily life? No  Do you often feel hopeless? No  Do you cry easily over simple problems? No  Activities of Daily Living In your present state of health, do you have any difficulty performing the following activities?:  Driving? Yes Managing money?  Yes Feeding yourself? No Getting from bed to chair? No Climbing a flight of stairs? No Preparing food and eating?: No Bathing or showering? No Getting dressed: No Getting to the toilet? No Using the toilet:No Moving around from place to place: No In the past year have you fallen or had a near fall?:Yes   Are you sexually active?  No  Do you have more than one partner?  No  Vision Difficulties: Yes  Hearing Difficulties: Yes Do you often ask people to speak up or repeat themselves? Yes Do you experience ringing or noises in your ears? No Do you have difficulty understanding soft or whispered voices? Yes  Cognition  Do you feel that you have a problem with memory?Yes  Do you often misplace items? Yes  Do you feel safe at home?  Yes  Advanced directives Does patient have a [place]? Yes Does patient have a Living Will? Yes   Objective:   Blood pressure 130/70, pulse 84, temperature 97.5 F (36.4 C), resp. rate 16, height 5\' 8"  (1.727 m), weight 173 lb (78.472 kg). Body mass index is 26.31 kg/(m^2).  General appearance: alert, no distress, WD/WN, male Cognitive Testing  Alert? Yes  Normal Appearance?Yes  Oriented to person? Yes  Place? Yes   Time? Yes  Recall of three objects?  No  Can perform simple calculations? Yes  Displays appropriate  judgment?Yes  General Appearance: Well nourished, in no apparent distress. Eyes: PERRLA, EOMs, conjunctiva no swelling or erythema, normal fundi and vessels. Sinuses: No Frontal/maxillary tenderness ENT/Mouth: Ext aud canals clear, normal light reflex with TMs without erythema, bulging. Good dentition. No erythema, swelling, or exudate on post pharynx. Tonsils not swollen or erythematous. Hearing decreased, he has hearing aids but does not wear them.  Neck: Supple, thyroid normal. No bruits Respiratory: Respiratory effort normal, BS equal bilaterally without rales, rhonchi, wheezing or stridor. Cardio: Irreg, Irreg with 2/6 systolic murmur without rubs or gallops. Brisk peripheral pulses without edema.  Chest: symmetric, with normal excursions and percussion. Abdomen: Soft, +BS. Non tender, no guarding, rebound, hernias, masses,  or organomegaly. .  Lymphatics: Non tender without lymphadenopathy.  Genitourinary: defer Musculoskeletal: Full ROM all peripheral extremities,4/5 strength Skin: Warm, dry without rashes, ecchymosis. Several places on his face,ears, and left temple that need to be frozen/removed.  Neuro: Cranial nerves intact, reflexes equal bilaterally. Normal muscle tone, no cerebellar symptoms. Sensation intact.  Psych: Awake and oriented X 3, normal affect, Insight and Judgment appropriate.   Medicare Attestation I have personally reviewed: The patient's medical and social history Their use of alcohol, tobacco or illicit drugs Their current medications and supplements The patient's functional ability including ADLs,fall risks, home safety risks, cognitive, and hearing and visual impairment Diet and physical activities Evidence for depression or mood disorders  The patient's weight, height, BMI, and visual acuity have been recorded in the chart.  I have made referrals, counseling, and provided education to the patient based on review of the above and I have provided the patient  with a written personalized care plan for preventive services.     Vicie Mutters, PA-C   09/18/2014

## 2014-09-18 NOTE — Patient Instructions (Addendum)
Magnesium low add 400 mg daily. Magnesium may help with muscle cramps, constipation, vitamin D and potassium absorption.   Benefiber is good for constipation/diarrhea/irritable bowel syndrome, it helps with weight loss and can help lower your bad cholesterol. Please do 1-2 TBSP in the morning in water, coffee, or tea. It can take up to a month before you can see a difference with your bowel movements. It is cheapest from [employer], [employer], [employer].    Preventative Care for Adults, Male       REGULAR HEALTH EXAMS:  A routine yearly physical is a good way to check in with your primary care provider about your health and preventive screening. It is also an opportunity to share updates about your health and any concerns you have, and receive a thorough all-over exam.   Most health insurance companies pay for at least some preventative services.  Check with your health plan for specific coverages.  WHAT PREVENTATIVE SERVICES DO MEN NEED?  Adult men should have their weight and blood pressure checked regularly.   Men age 32 and older should have their cholesterol levels checked regularly.  Beginning at age 58 and continuing to age 108, men should be screened for colorectal cancer.  Certain people should may need continued testing until age 58.  Other cancer screening may include exams for testicular and prostate cancer.  Updating vaccinations is part of preventative care.  Vaccinations help protect against diseases such as the flu.  Lab tests are generally done as part of preventative care to screen for anemia and blood disorders, to screen for problems with the kidneys and liver, to screen for bladder problems, to check blood sugar, and to check your cholesterol level.  Preventative services generally include counseling about diet, exercise, avoiding tobacco, drugs, excessive alcohol consumption, and sexually transmitted infections.    GENERAL RECOMMENDATIONS FOR GOOD HEALTH:  Healthy  diet:  Eat a variety of foods, including fruit, vegetables, animal or vegetable protein, such as meat, fish, chicken, and eggs, or beans, lentils, tofu, and grains, such as rice.  Drink plenty of water daily.  Decrease saturated fat in the diet, avoid lots of red meat, processed foods, sweets, fast foods, and fried foods.  Exercise:  Aerobic exercise helps maintain good heart health. At least 30-40 minutes of moderate-intensity exercise is recommended. For example, a brisk walk that increases your heart rate and breathing. This should be done on most days of the week.   Find a type of exercise or a variety of exercises that you enjoy so that it becomes a part of your daily life.  Examples are running, walking, swimming, water aerobics, and biking.  For motivation and support, explore group exercise such as aerobic class, spin class, Zumba, Yoga,or  martial arts, etc.    Set exercise goals for yourself, such as a certain weight goal, walk or run in a race such as a 5k walk/run.  Speak to your primary care provider about exercise goals.  Disease prevention:  If you smoke or chew tobacco, find out from your caregiver how to quit. It can literally save your life, no matter how long you have been a tobacco user. If you do not use tobacco, never begin.   Maintain a healthy diet and normal weight. Increased weight leads to problems with blood pressure and diabetes.   The Body Mass Index or BMI is a way of measuring how much of your body is fat. Having a BMI above 27 increases the risk of heart  disease, diabetes, hypertension, stroke and other problems related to obesity. Your caregiver can help determine your BMI and based on it develop an exercise and dietary program to help you achieve or maintain this important measurement at a healthful level.  High blood pressure causes heart and blood vessel problems.  Persistent high blood pressure should be treated with medicine if weight loss and exercise  do not work.   Fat and cholesterol leaves deposits in your arteries that can block them. This causes heart disease and vessel disease elsewhere in your body.  If your cholesterol is found to be high, or if you have heart disease or certain other medical conditions, then you may need to have your cholesterol monitored frequently and be treated with medication.   Ask if you should have a stress test if your history suggests this. A stress test is a test done on a treadmill that looks for heart disease. This test can find disease prior to there being a problem.  Avoid drinking alcohol in excess (more than two drinks per day).  Avoid use of street drugs. Do not share needles with anyone. Ask for professional help if you need assistance or instructions on stopping the use of alcohol, cigarettes, and/or drugs.  Brush your teeth twice a day with fluoride toothpaste, and floss once a day. Good oral hygiene prevents tooth decay and gum disease. The problems can be painful, unattractive, and can cause other health problems. Visit your dentist for a routine oral and dental check up and preventive care every 6-12 months.   Look at your skin regularly.  Use a mirror to look at your back. Notify your caregivers of changes in moles, especially if there are changes in shapes, colors, a size larger than a pencil eraser, an irregular border, or development of new moles.  Safety:  Use seatbelts 100% of the time, whether driving or as a passenger.  Use safety devices such as hearing protection if you work in environments with loud noise or significant background noise.  Use safety glasses when doing any work that could send debris in to the eyes.  Use a helmet if you ride a bike or motorcycle.  Use appropriate safety gear for contact sports.  Talk to your caregiver about gun safety.  Use sunscreen with a SPF (or skin protection factor) of 15 or greater.  Lighter skinned people are at a greater risk of skin cancer. Don't  forget to also wear sunglasses in order to protect your eyes from too much damaging sunlight. Damaging sunlight can accelerate cataract formation.   Practice safe sex. Use condoms. Condoms are used for birth control and to help reduce the spread of sexually transmitted infections (or STIs).  Some of the STIs are gonorrhea (the clap), chlamydia, syphilis, trichomonas, herpes, HPV (human papilloma virus) and HIV (human immunodeficiency virus) which causes AIDS. The herpes, HIV and HPV are viral illnesses that have no cure. These can result in disability, cancer and death.   Keep carbon monoxide and smoke detectors in your home functioning at all times. Change the batteries every 6 months or use a model that plugs into the wall.   Vaccinations:  Stay up to date with your tetanus shots and other required immunizations. You should have a booster for tetanus every 10 years. Be sure to get your flu shot every year, since 5%-20% of the [place] population comes down with the flu. The flu vaccine changes each year, so being vaccinated once is not enough. Get  your shot in the fall, before the flu season peaks.   Other vaccines to consider:  Pneumococcal vaccine to protect against certain types of pneumonia.  This is normally recommended for adults age 63 or older.  However, adults younger than 78 years old with certain underlying conditions such as diabetes, heart or lung disease should also receive the vaccine.  Shingles vaccine to protect against Varicella Zoster if you are older than age 68, or younger than 78 years old with certain underlying illness.  Hepatitis A vaccine to protect against a form of infection of the liver by a virus acquired from food.  Hepatitis B vaccine to protect against a form of infection of the liver by a virus acquired from blood or body fluids, particularly if you work in health care.  If you plan to travel internationally, check with your local health department for specific  vaccination recommendations.  Cancer Screening:  Most routine colon cancer screening begins at the age of 39. On a yearly basis, doctors may provide special easy to use take-home tests to check for hidden blood in the stool. Sigmoidoscopy or colonoscopy can detect the earliest forms of colon cancer and is life saving. These tests use a small camera at the end of a tube to directly examine the colon. Speak to your caregiver about this at age 34, when routine screening begins (and is repeated every 5 years unless early forms of pre-cancerous polyps or small growths are found).   At the age of 59 men usually start screening for prostate cancer every year. Screening may begin at a younger age for those with higher risk. Those at higher risk include African-Americans or having a family history of prostate cancer. There are two types of tests for prostate cancer:   Prostate-specific antigen (PSA) testing. Recent studies raise questions about prostate cancer using PSA and you should discuss this with your caregiver.   Digital rectal exam (in which your doctor's lubricated and gloved finger feels for enlargement of the prostate through the anus).   Screening for testicular cancer.  Do a monthly exam of your testicles. Gently roll each testicle between your thumb and fingers, feeling for any abnormal lumps. The best time to do this is after a hot shower or bath when the tissues are looser. Notify your caregivers of any lumps, tenderness or changes in size or shape immediately.

## 2014-10-03 DIAGNOSIS — Z8673 Personal history of transient ischemic attack (TIA), and cerebral infarction without residual deficits: Secondary | ICD-10-CM | POA: Diagnosis not present

## 2014-10-03 DIAGNOSIS — G4733 Obstructive sleep apnea (adult) (pediatric): Secondary | ICD-10-CM | POA: Diagnosis not present

## 2014-10-03 DIAGNOSIS — E784 Other hyperlipidemia: Secondary | ICD-10-CM | POA: Diagnosis not present

## 2014-10-03 DIAGNOSIS — Z950 Presence of cardiac pacemaker: Secondary | ICD-10-CM | POA: Diagnosis not present

## 2014-10-03 DIAGNOSIS — Z7901 Long term (current) use of anticoagulants: Secondary | ICD-10-CM | POA: Diagnosis not present

## 2014-10-03 DIAGNOSIS — I951 Orthostatic hypotension: Secondary | ICD-10-CM | POA: Diagnosis not present

## 2014-10-03 DIAGNOSIS — I480 Paroxysmal atrial fibrillation: Secondary | ICD-10-CM | POA: Diagnosis not present

## 2014-10-03 DIAGNOSIS — E039 Hypothyroidism, unspecified: Secondary | ICD-10-CM | POA: Diagnosis not present

## 2014-10-10 ENCOUNTER — Encounter: Payer: Self-pay | Admitting: Neurology

## 2014-10-16 ENCOUNTER — Encounter: Payer: Self-pay | Admitting: Neurology

## 2014-10-31 DIAGNOSIS — G4733 Obstructive sleep apnea (adult) (pediatric): Secondary | ICD-10-CM | POA: Diagnosis not present

## 2014-10-31 DIAGNOSIS — I951 Orthostatic hypotension: Secondary | ICD-10-CM | POA: Diagnosis not present

## 2014-10-31 DIAGNOSIS — Z8673 Personal history of transient ischemic attack (TIA), and cerebral infarction without residual deficits: Secondary | ICD-10-CM | POA: Diagnosis not present

## 2014-10-31 DIAGNOSIS — Z950 Presence of cardiac pacemaker: Secondary | ICD-10-CM | POA: Diagnosis not present

## 2014-10-31 DIAGNOSIS — E039 Hypothyroidism, unspecified: Secondary | ICD-10-CM | POA: Diagnosis not present

## 2014-10-31 DIAGNOSIS — Z7901 Long term (current) use of anticoagulants: Secondary | ICD-10-CM | POA: Diagnosis not present

## 2014-10-31 DIAGNOSIS — I480 Paroxysmal atrial fibrillation: Secondary | ICD-10-CM | POA: Diagnosis not present

## 2014-10-31 DIAGNOSIS — E784 Other hyperlipidemia: Secondary | ICD-10-CM | POA: Diagnosis not present

## 2014-11-01 ENCOUNTER — Encounter (HOSPITAL_COMMUNITY): Payer: Self-pay | Admitting: Internal Medicine

## 2014-11-25 DIAGNOSIS — G4733 Obstructive sleep apnea (adult) (pediatric): Secondary | ICD-10-CM | POA: Diagnosis not present

## 2014-11-25 DIAGNOSIS — I951 Orthostatic hypotension: Secondary | ICD-10-CM | POA: Diagnosis not present

## 2014-11-25 DIAGNOSIS — Z7901 Long term (current) use of anticoagulants: Secondary | ICD-10-CM | POA: Diagnosis not present

## 2014-11-25 DIAGNOSIS — Z950 Presence of cardiac pacemaker: Secondary | ICD-10-CM | POA: Diagnosis not present

## 2014-11-25 DIAGNOSIS — E039 Hypothyroidism, unspecified: Secondary | ICD-10-CM | POA: Diagnosis not present

## 2014-11-25 DIAGNOSIS — I480 Paroxysmal atrial fibrillation: Secondary | ICD-10-CM | POA: Diagnosis not present

## 2014-11-25 DIAGNOSIS — E784 Other hyperlipidemia: Secondary | ICD-10-CM | POA: Diagnosis not present

## 2014-11-25 DIAGNOSIS — Z8673 Personal history of transient ischemic attack (TIA), and cerebral infarction without residual deficits: Secondary | ICD-10-CM | POA: Diagnosis not present

## 2014-11-28 DIAGNOSIS — Z8673 Personal history of transient ischemic attack (TIA), and cerebral infarction without residual deficits: Secondary | ICD-10-CM | POA: Diagnosis not present

## 2014-11-28 DIAGNOSIS — G4733 Obstructive sleep apnea (adult) (pediatric): Secondary | ICD-10-CM | POA: Diagnosis not present

## 2014-11-28 DIAGNOSIS — I951 Orthostatic hypotension: Secondary | ICD-10-CM | POA: Diagnosis not present

## 2014-11-28 DIAGNOSIS — E039 Hypothyroidism, unspecified: Secondary | ICD-10-CM | POA: Diagnosis not present

## 2014-11-28 DIAGNOSIS — Z7901 Long term (current) use of anticoagulants: Secondary | ICD-10-CM | POA: Diagnosis not present

## 2014-11-28 DIAGNOSIS — E784 Other hyperlipidemia: Secondary | ICD-10-CM | POA: Diagnosis not present

## 2014-11-28 DIAGNOSIS — I480 Paroxysmal atrial fibrillation: Secondary | ICD-10-CM | POA: Diagnosis not present

## 2014-11-28 DIAGNOSIS — Z950 Presence of cardiac pacemaker: Secondary | ICD-10-CM | POA: Diagnosis not present

## 2014-12-12 ENCOUNTER — Encounter: Payer: Self-pay | Admitting: Physician Assistant

## 2014-12-19 ENCOUNTER — Ambulatory Visit (INDEPENDENT_AMBULATORY_CARE_PROVIDER_SITE_OTHER): Payer: Medicare Other | Admitting: Physician Assistant

## 2014-12-19 ENCOUNTER — Other Ambulatory Visit: Payer: Self-pay | Admitting: Physician Assistant

## 2014-12-19 ENCOUNTER — Encounter: Payer: Self-pay | Admitting: Physician Assistant

## 2014-12-19 VITALS — BP 122/80 | HR 76 | Temp 97.7°F | Resp 16 | Ht 68.0 in | Wt 176.0 lb

## 2014-12-19 DIAGNOSIS — J449 Chronic obstructive pulmonary disease, unspecified: Secondary | ICD-10-CM | POA: Insufficient documentation

## 2014-12-19 DIAGNOSIS — R7309 Other abnormal glucose: Secondary | ICD-10-CM

## 2014-12-19 DIAGNOSIS — N320 Bladder-neck obstruction: Secondary | ICD-10-CM

## 2014-12-19 DIAGNOSIS — E785 Hyperlipidemia, unspecified: Secondary | ICD-10-CM

## 2014-12-19 DIAGNOSIS — I495 Sick sinus syndrome: Secondary | ICD-10-CM | POA: Diagnosis not present

## 2014-12-19 DIAGNOSIS — I482 Chronic atrial fibrillation, unspecified: Secondary | ICD-10-CM

## 2014-12-19 DIAGNOSIS — Z9181 History of falling: Secondary | ICD-10-CM

## 2014-12-19 DIAGNOSIS — R296 Repeated falls: Secondary | ICD-10-CM | POA: Diagnosis not present

## 2014-12-19 DIAGNOSIS — R7303 Prediabetes: Secondary | ICD-10-CM

## 2014-12-19 DIAGNOSIS — Z79899 Other long term (current) drug therapy: Secondary | ICD-10-CM | POA: Diagnosis not present

## 2014-12-19 DIAGNOSIS — I10 Essential (primary) hypertension: Secondary | ICD-10-CM | POA: Diagnosis not present

## 2014-12-19 DIAGNOSIS — G4733 Obstructive sleep apnea (adult) (pediatric): Secondary | ICD-10-CM

## 2014-12-19 DIAGNOSIS — I6789 Other cerebrovascular disease: Secondary | ICD-10-CM

## 2014-12-19 DIAGNOSIS — Z23 Encounter for immunization: Secondary | ICD-10-CM | POA: Diagnosis not present

## 2014-12-19 DIAGNOSIS — R350 Frequency of micturition: Secondary | ICD-10-CM | POA: Diagnosis not present

## 2014-12-19 DIAGNOSIS — G309 Alzheimer's disease, unspecified: Secondary | ICD-10-CM

## 2014-12-19 DIAGNOSIS — G2581 Restless legs syndrome: Secondary | ICD-10-CM

## 2014-12-19 DIAGNOSIS — B350 Tinea barbae and tinea capitis: Secondary | ICD-10-CM

## 2014-12-19 DIAGNOSIS — Z9989 Dependence on other enabling machines and devices: Secondary | ICD-10-CM

## 2014-12-19 DIAGNOSIS — E559 Vitamin D deficiency, unspecified: Secondary | ICD-10-CM

## 2014-12-19 DIAGNOSIS — Z950 Presence of cardiac pacemaker: Secondary | ICD-10-CM

## 2014-12-19 DIAGNOSIS — F0280 Dementia in other diseases classified elsewhere without behavioral disturbance: Secondary | ICD-10-CM

## 2014-12-19 DIAGNOSIS — I2510 Atherosclerotic heart disease of native coronary artery without angina pectoris: Secondary | ICD-10-CM

## 2014-12-19 LAB — CBC WITH DIFFERENTIAL/PLATELET
BASOS PCT: 0 % (ref 0–1)
Basophils Absolute: 0 10*3/uL (ref 0.0–0.1)
EOS PCT: 2 % (ref 0–5)
Eosinophils Absolute: 0.2 10*3/uL (ref 0.0–0.7)
HEMATOCRIT: 41 % (ref 39.0–52.0)
Hemoglobin: 13.9 g/dL (ref 13.0–17.0)
LYMPHS ABS: 1.9 10*3/uL (ref 0.7–4.0)
Lymphocytes Relative: 24 % (ref 12–46)
MCH: 30.2 pg (ref 26.0–34.0)
MCHC: 33.9 g/dL (ref 30.0–36.0)
MCV: 89.1 fL (ref 78.0–100.0)
MONO ABS: 0.6 10*3/uL (ref 0.1–1.0)
MPV: 10.3 fL (ref 8.6–12.4)
Monocytes Relative: 8 % (ref 3–12)
Neutro Abs: 5.1 10*3/uL (ref 1.7–7.7)
Neutrophils Relative %: 66 % (ref 43–77)
PLATELETS: 197 10*3/uL (ref 150–400)
RBC: 4.6 MIL/uL (ref 4.22–5.81)
RDW: 13.9 % (ref 11.5–15.5)
WBC: 7.8 10*3/uL (ref 4.0–10.5)

## 2014-12-19 LAB — HEMOGLOBIN A1C
Hgb A1c MFr Bld: 5.8 % — ABNORMAL HIGH (ref <5.7)
Mean Plasma Glucose: 120 mg/dL — ABNORMAL HIGH (ref <117)

## 2014-12-19 MED ORDER — KETOCONAZOLE 2 % EX CREA: 1.0000 "application " | TOPICAL_CREAM | Freq: Two times a day (BID) | CUTANEOUS | Status: DC | PRN

## 2014-12-19 NOTE — Progress Notes (Signed)
Complete Physical  Assessment and Plan: 1. Sick sinus syndrome Continue cardio follow up  2. Essential hypertension - DASH diet, exercise and monitor at home. Call if greater than 130/80.  - CBC with Differential/Platelet - BASIC METABOLIC PANEL WITH GFR - Hepatic function panel - TSH - Urinalysis, Routine w reflex microscopic - Microalbumin / creatinine urine ratio  3. Chronic atrial fibrillation Continue coumadin, Continue cardio follow up  4. C V A / STROKE On coumadin, + high fall risk but no bleeding symptoms at this time and very high risk CVA.   5. Atherosclerosis of native coronary artery of native heart without angina pectoris Control blood pressure, cholesterol, glucose, increase exercise.  Continue cardio follow up  6. Prediabetes Discussed general issues about diabetes pathophysiology and management., Educational material distributed., Suggested low cholesterol diet., Encouraged aerobic exercise., Discussed foot care., Reminded to get yearly retinal exam. - Hemoglobin A1c - Insulin, fasting - HM DIABETES FOOT EXAM  7. Hyperlipidemia -continue medications, check lipids, decrease fatty foods, increase activity.  - Lipid panel  8. OSA on CPAP Wrote RX for new machine, [facility]  9. Alzheimer's disease Continue Namenda, follow up Dr. Leonie Man  10. BPH Following up with Dr. Risa Grill, can try to restart tamsulosin AT NIGHT  11. Cardiac pacemaker in situ Continue cardio follow up  12. Vitamin D deficiency Continue supplement  13. Encounter for long-term (current) use of medications - Magnesium  14. Chronic obstructive pulmonary disease, unspecified COPD, unspecified chronic bronchitis type Cough but lungs CTAB, declines CXR/formal work up, will try nexium samples rule out GERD  15. At high risk for falls Declines PT  16. RLS (restless legs syndrome) Do just the the tylenol PM, stop klonopin if can tolerate, this will help decrease  fall risk.   17. Tinea barbae - ketoconazole (NIZORAL) 2 % cream; Apply 1 application topically 2 (two) times daily as needed for irritation.  Dispense: 30 g; Refill: 2 - [facility]  18. Urinary frequency - Urine culture  19. Need for prophylactic vaccination against Streptococcus pneumoniae (pneumococcus) - Pneumococcal conjugate vaccine 13-valent IM  Discussed med's effects and SE's. Screening labs and tests as requested with regular follow-up as recommended. OVER 40 minutes of exam, counseling, chart review, referral performed   HPI Patient presents for a complete physical.   His blood pressure has been controlled at home, today their BP is BP: 122/80 mmHg He does not workout but is active. He denies chest pain, shortness of breath, dizziness.  He has Afib/SSS and is on coumadin and has pacemaker, follows with [name] and coumadin clinic, goes again Feb 3rd, has been normal. Declines nose bleeds, blood in stool, blood in urine.  He is not on tamsulosin and is complaining of urinary frequency.  He has early dementia, he is on Namenda and follows with Dr. Leonie Man, he is high fall risk. Had a fall last week, fell in tub, denies hitting head/LOC.  He is not on cholesterol medication and denies myalgias. His cholesterol is at goal less than 100. The cholesterol last visit was:   Lab Results  Component Value Date   CHOL 190 09/18/2014   HDL 61 09/18/2014   LDLCALC 91 09/18/2014   TRIG 188* 09/18/2014   CHOLHDL 3.1 09/18/2014  He has been working on diet and exercise for prediabetes, and denies paresthesia of the feet, polydipsia, polyuria and visual disturbances. Last A1C in the office was:  Lab Results  Component Value Date   HGBA1C 5.8* 09/18/2014  Patient is on Vitamin D supplement.   Lab Results  Component Value Date   VD25OH 57 03/19/2014  He is on klonopin and gabapentin at night for RLS, he has been out of these meds for 2 weeks and wife has been  giving him Tylenol PM which has helped and is on CPAP for OSA. He is only on this part of the time due to problems with the machine, he would like a new machine.  He is on thyroid medication but has been out of for unknown period of time. His medication was not changed last visit.   Lab Results  Component Value Date   TSH 2.793 09/18/2014   Wt Readings from Last 3 Encounters:  12/19/14 176 lb (79.833 kg)  09/18/14 173 lb (78.472 kg)  09/06/14 171 lb (77.565 kg)    Current Medications:  Current Outpatient Prescriptions on File Prior to Visit  Medication Sig Dispense Refill  . cholecalciferol (VITAMIN D) 1000 UNITS tablet Take 2,000 Units by mouth daily.     . clonazePAM (KLONOPIN) 0.5 MG tablet Take 1 tablet (0.5 mg total) by mouth at bedtime. 90 tablet 1  . hydrocortisone 2.5 % cream Apply 1 application topically daily. To face    . levothyroxine (SYNTHROID, LEVOTHROID) 50 MCG tablet Take 1 tablet (50 mcg total) by mouth daily before breakfast. 90 tablet 1  . memantine (NAMENDA) 10 MG tablet Take 1 tablet (10 mg total) by mouth 2 (two) times daily. 60 tablet 6  . tamsulosin (FLOMAX) 0.4 MG CAPS capsule Take 0.4 mg by mouth daily.    Marland Kitchen warfarin (COUMADIN) 6 MG tablet Take 3-9 mg by mouth daily. Take 1.5 tablets every Monday, Wednesday, and Friday. Take 0.5 tablet every Tuesday, Thursday, Saturday and Sunday.     No current facility-administered medications on file prior to visit.   Health Maintenance:  Immunization History  Administered Date(s) Administered  . Influenza, High Dose Seasonal PF 09/18/2014  . Influenza-Unspecified 10/04/2013  . Pneumococcal Polysaccharide-23 10/17/2013  . Td 12/08/2012   Tetanus: 2014  Pneumovax: 2014  Prevnar: DUE Flu vaccine:09/2014.  Zostavax: declines due cost DEXA: 08/2012 osteopenia DUE   Colonoscopy: 01/15/2009 Dr. Ardis Hughs.  EGD: N/A  CXR 01/2013 COPD changes Echo 2006 MRI brain 2006 Eye Exam: Dr. Frederico Hamman Dec 2015 Dentist: Dr.  Carman Ching [facility]   Patient Care Team: Unk Pinto, MD as PCP - General (Internal Medicine) Jacolyn Reedy, MD as Consulting Physician (Cardiology) Bernestine Amass, MD as Consulting Physician (Urology) Antony Contras, MD as Consulting Physician (Neurology)  Allergies:  Allergies  Allergen Reactions  . Bee Venom    Medical History:  Past Medical History  Diagnosis Date  . Stroke     twice  . Hyperlipidemia   . Hypertension   . OSA (obstructive sleep apnea)   . RLS (restless legs syndrome)   . Osteopenia   . Thyroid disease   . Vitamin D deficiency   . Pacemaker medtronic   . DJD (degenerative joint disease)   . Atherosclerosis of aorta 2010    noted on lumbar CT in 2010  . COPD (chronic obstructive pulmonary disease)     On CXR  . Sinoatrial node dysfunction   . Prediabetes   . Atrial fibrillation    Surgical History:  Past Surgical History  Procedure Laterality Date  . Pacemaker insertion    . Pacemaker generator change N/A 05/04/2014  Procedure: PACEMAKER GENERATOR CHANGE;  Surgeon: [name], MD;  Location: [facility] CATH LAB;  Service: Cardiovascular;  Laterality: N/A;   Family History:  Family History  Problem Relation Age of Onset  . Heart attack Father    Social History:   History  Substance Use Topics  . Smoking status: Never Smoker   . Smokeless tobacco: Never Used  . Alcohol Use: No   Review of Systems:  Review of Systems  Constitutional: Positive for malaise/fatigue. Negative for fever, chills, weight loss and diaphoresis.  HENT: Positive for hearing loss. Negative for congestion, ear discharge, ear pain, nosebleeds, sore throat and tinnitus.   Eyes: Negative.   Respiratory: Positive for cough. Negative for hemoptysis, sputum production, shortness of breath, wheezing and stridor.   Cardiovascular: Negative.  Negative for chest pain, claudication and leg swelling.  Gastrointestinal: Negative.   Genitourinary: Positive for  frequency. Negative for dysuria, urgency, hematuria and flank pain.       + incontinence  Musculoskeletal: Positive for myalgias (legs occ) and falls. Negative for back pain, joint pain and neck pain.  Skin: Positive for rash (on face from CPAP). Negative for itching.  Neurological: Negative.  Negative for weakness and headaches.  Psychiatric/Behavioral: Positive for memory loss. Negative for depression, suicidal ideas, hallucinations and substance abuse. The patient is not nervous/anxious and does not have insomnia.    Physical Exam: Estimated body mass index is 26.77 kg/(m^2) as calculated from the following:   Height as of this encounter: 5\' 8"  (1.727 m).   Weight as of this encounter: 176 lb (79.833 kg). BP 122/80 mmHg  Pulse 76  Temp(Src) 97.7 F (36.5 C)  Resp 16  Ht 5\' 8"  (1.727 m)  Wt 176 lb (79.833 kg)  BMI 26.77 kg/m2 General Appearance: Well nourished, in no apparent distress. Eyes: PERRLA, EOMs, conjunctiva no swelling or erythema, normal fundi and vessels. Sinuses: No Frontal/maxillary tenderness ENT/Mouth: Ext aud canals clear, normal light reflex with TMs without erythema, bulging. Good dentition. No erythema, swelling, or exudate on post pharynx. Tonsils not swollen or erythematous. Hearing decreased, he has hearing aids but does not wear them.  Neck: Supple, thyroid normal. No bruits Respiratory: Respiratory effort normal, BS equal bilaterally without rales, rhonchi, wheezing or stridor. Cardio: today in NSR with 2/6 systolic murmur without rubs or gallops. Brisk peripheral pulses without edema.  Chest: symmetric, with normal excursions and percussion. Abdomen: Soft, +BS. Non tender, no guarding, rebound, hernias, masses, or organomegaly. .  Lymphatics: Non tender without lymphadenopathy.  Genitourinary: defer Musculoskeletal: Full ROM all peripheral extremities,4/5 strength Skin: Warm, dry without ecchymosis. Several places on his face,ears, and left temple that need  to be frozen/removed. + rash around mouth, scaly demarcated erythematous patches- follows with [facility] Neuro: Cranial nerves intact, reflexes equal bilaterally. Normal muscle tone, no cerebellar symptoms. Sensation intact.  Psych: Awake and oriented X 3, normal affect.   EKG: declines, sees cardio regularly and has pacemaker  Vicie Mutters 10:54 AM [facility]

## 2014-12-19 NOTE — Patient Instructions (Signed)
Four common causes of cough:  Allergies, Viral Infections, Acid Reflux and Bacterial Infections. 1) Allergies and viral infections cause a cough by post nasal drip and are often worse at night, can also have sneezing, lower grade fevers, clear/yellow mucus. This is best treated with allergy medications or nasal sprays.  2) Bacterial infections are more severe than allergies or viral infections with fever, teeth pain, fatigue. This can be treated with prednisone and the same over the counter medication and after 7 days an antibiotic.  3) Silent reflux/GERD can cause a cough WITHOUT heart burn because the esophagus that goes to the stomach and trachea that goes to the lungs are very close and when you lay down the acid can irritate your throat and lungs. This can cause hoarseness, cough, and wheezing. Please stop any alcohol or anti-inflammatories like aleve/advil/ibuprofen and start an over the counter Prilosec or omeprazole 1-2 times daily 67mins before food for 2 weeks, then switch to over the counter zantac/ratinidine or pepcid/famotadine once at night for 2 weeks.   4) sometimes irritation causes more irritation. Try voice rest, use sugar free cough drops to prevent coughing, and try to stop clearing your throat.   If you ever have a cough that does not go away after trying these things please make a follow up visit for further evaluation or we can refer you to a specialist. Or if you ever have shortness of breath or chest pain go to the ER.    Take omeprazole over the counter for 2 weeks, then go to zantac 150-300 mg at night for 2 weeks, then you can stop.  Avoid alcohol, spicy foods, NSAIDS (aleve, ibuprofen) at this time. See foods below.   Food Choices for Gastroesophageal Reflux Disease When you have gastroesophageal reflux disease (GERD), the foods you eat and your eating habits are very important. Choosing the right foods can help ease the discomfort of GERD. WHAT GENERAL GUIDELINES DO I  NEED TO FOLLOW?  Choose fruits, vegetables, whole grains, low-fat dairy products, and low-fat meat, fish, and poultry.  Limit fats such as oils, salad dressings, butter, nuts, and avocado.  Keep a food diary to identify foods that cause symptoms.  Avoid foods that cause reflux. These may be different for different people.  Eat frequent small meals instead of three large meals each day.  Eat your meals slowly, in a relaxed setting.  Limit fried foods.  Cook foods using methods other than frying.  Avoid drinking alcohol.  Avoid drinking large amounts of liquids with your meals.  Avoid bending over or lying down until 2-3 hours after eating. WHAT FOODS ARE NOT RECOMMENDED? The following are some foods and drinks that may worsen your symptoms: Vegetables Tomatoes. Tomato juice. Tomato and spaghetti sauce. Chili peppers. Onion and garlic. Horseradish. Fruits Oranges, grapefruit, and lemon (fruit and juice). Meats High-fat meats, fish, and poultry. This includes hot dogs, ribs, ham, sausage, salami, and bacon. Dairy Whole milk and chocolate milk. Sour cream. Cream. Butter. Ice cream. Cream cheese.  Beverages Coffee and tea, with or without caffeine. Carbonated beverages or energy drinks. Condiments Hot sauce. Barbecue sauce.  Sweets/Desserts Chocolate and cocoa. Donuts. Peppermint and spearmint. Fats and Oils High-fat foods, including [place] fries and potato chips. Other Vinegar. Strong spices, such as black pepper, white pepper, red pepper, cayenne, curry powder, cloves, ginger, and chili powder.

## 2014-12-20 LAB — BASIC METABOLIC PANEL WITH GFR
BUN: 19 mg/dL (ref 6–23)
CO2: 28 mEq/L (ref 19–32)
Calcium: 9.1 mg/dL (ref 8.4–10.5)
Chloride: 104 mEq/L (ref 96–112)
Creat: 0.92 mg/dL (ref 0.50–1.35)
GFR, Est African American: 89 mL/min
GFR, Est Non African American: 77 mL/min
Glucose, Bld: 90 mg/dL (ref 70–99)
POTASSIUM: 4.8 meq/L (ref 3.5–5.3)
SODIUM: 138 meq/L (ref 135–145)

## 2014-12-20 LAB — INSULIN, FASTING: INSULIN FASTING, SERUM: 3.1 u[IU]/mL (ref 2.0–19.6)

## 2014-12-20 LAB — HEPATIC FUNCTION PANEL
ALT: 19 U/L (ref 0–53)
AST: 21 U/L (ref 0–37)
Albumin: 4.2 g/dL (ref 3.5–5.2)
Alkaline Phosphatase: 65 U/L (ref 39–117)
BILIRUBIN INDIRECT: 0.5 mg/dL (ref 0.2–1.2)
Bilirubin, Direct: 0.1 mg/dL (ref 0.0–0.3)
TOTAL PROTEIN: 6.8 g/dL (ref 6.0–8.3)
Total Bilirubin: 0.6 mg/dL (ref 0.2–1.2)

## 2014-12-20 LAB — URINALYSIS, ROUTINE W REFLEX MICROSCOPIC
Bilirubin Urine: NEGATIVE
Glucose, UA: NEGATIVE mg/dL
HGB URINE DIPSTICK: NEGATIVE
KETONES UR: NEGATIVE mg/dL
LEUKOCYTES UA: NEGATIVE
Nitrite: NEGATIVE
PH: 6 (ref 5.0–8.0)
Protein, ur: NEGATIVE mg/dL
Specific Gravity, Urine: 1.023 (ref 1.005–1.030)
UROBILINOGEN UA: 0.2 mg/dL (ref 0.0–1.0)

## 2014-12-20 LAB — LIPID PANEL
Cholesterol: 207 mg/dL — ABNORMAL HIGH (ref 0–200)
HDL: 59 mg/dL (ref >39)
LDL Cholesterol: 101 mg/dL — ABNORMAL HIGH (ref 0–99)
Total CHOL/HDL Ratio: 3.5 Ratio
Triglycerides: 236 mg/dL — ABNORMAL HIGH (ref <150)
VLDL: 47 mg/dL — ABNORMAL HIGH (ref 0–40)

## 2014-12-20 LAB — TSH: TSH: 3.308 u[IU]/mL (ref 0.350–4.500)

## 2014-12-20 LAB — MICROALBUMIN / CREATININE URINE RATIO
CREATININE, URINE: 156.7 mg/dL
MICROALB UR: 0.5 mg/dL (ref <2.0)
Microalb Creat Ratio: 3.2 mg/g (ref 0.0–30.0)

## 2014-12-20 LAB — MAGNESIUM: MAGNESIUM: 2 mg/dL (ref 1.5–2.5)

## 2014-12-21 LAB — URINE CULTURE
Colony Count: NO GROWTH
ORGANISM ID, BACTERIA: NO GROWTH

## 2014-12-26 DIAGNOSIS — Z8673 Personal history of transient ischemic attack (TIA), and cerebral infarction without residual deficits: Secondary | ICD-10-CM | POA: Diagnosis not present

## 2014-12-26 DIAGNOSIS — Z950 Presence of cardiac pacemaker: Secondary | ICD-10-CM | POA: Diagnosis not present

## 2014-12-26 DIAGNOSIS — Z7901 Long term (current) use of anticoagulants: Secondary | ICD-10-CM | POA: Diagnosis not present

## 2014-12-26 DIAGNOSIS — I480 Paroxysmal atrial fibrillation: Secondary | ICD-10-CM | POA: Diagnosis not present

## 2014-12-26 DIAGNOSIS — I951 Orthostatic hypotension: Secondary | ICD-10-CM | POA: Diagnosis not present

## 2014-12-26 DIAGNOSIS — E784 Other hyperlipidemia: Secondary | ICD-10-CM | POA: Diagnosis not present

## 2014-12-26 DIAGNOSIS — G4733 Obstructive sleep apnea (adult) (pediatric): Secondary | ICD-10-CM | POA: Diagnosis not present

## 2014-12-26 DIAGNOSIS — E039 Hypothyroidism, unspecified: Secondary | ICD-10-CM | POA: Diagnosis not present

## 2015-01-01 ENCOUNTER — Other Ambulatory Visit: Payer: Self-pay | Admitting: *Deleted

## 2015-01-01 MED ORDER — TAMSULOSIN HCL 0.4 MG PO CAPS: 0.4000 mg | ORAL_CAPSULE | Freq: Every day | ORAL | Status: DC

## 2015-01-08 DIAGNOSIS — I480 Paroxysmal atrial fibrillation: Secondary | ICD-10-CM | POA: Diagnosis not present

## 2015-01-08 DIAGNOSIS — H04129 Dry eye syndrome of unspecified lacrimal gland: Secondary | ICD-10-CM | POA: Diagnosis not present

## 2015-01-08 DIAGNOSIS — E039 Hypothyroidism, unspecified: Secondary | ICD-10-CM | POA: Diagnosis not present

## 2015-01-08 DIAGNOSIS — H2513 Age-related nuclear cataract, bilateral: Secondary | ICD-10-CM | POA: Diagnosis not present

## 2015-01-08 DIAGNOSIS — E784 Other hyperlipidemia: Secondary | ICD-10-CM | POA: Diagnosis not present

## 2015-01-08 DIAGNOSIS — H2511 Age-related nuclear cataract, right eye: Secondary | ICD-10-CM | POA: Diagnosis not present

## 2015-01-08 DIAGNOSIS — G4733 Obstructive sleep apnea (adult) (pediatric): Secondary | ICD-10-CM | POA: Diagnosis not present

## 2015-01-08 DIAGNOSIS — H524 Presbyopia: Secondary | ICD-10-CM | POA: Diagnosis not present

## 2015-01-08 DIAGNOSIS — Z950 Presence of cardiac pacemaker: Secondary | ICD-10-CM | POA: Diagnosis not present

## 2015-01-08 DIAGNOSIS — I951 Orthostatic hypotension: Secondary | ICD-10-CM | POA: Diagnosis not present

## 2015-01-08 DIAGNOSIS — Z7901 Long term (current) use of anticoagulants: Secondary | ICD-10-CM | POA: Diagnosis not present

## 2015-01-08 DIAGNOSIS — Z8673 Personal history of transient ischemic attack (TIA), and cerebral infarction without residual deficits: Secondary | ICD-10-CM | POA: Diagnosis not present

## 2015-01-31 ENCOUNTER — Ambulatory Visit (INDEPENDENT_AMBULATORY_CARE_PROVIDER_SITE_OTHER): Payer: Medicare Other | Admitting: *Deleted

## 2015-01-31 DIAGNOSIS — J310 Chronic rhinitis: Secondary | ICD-10-CM | POA: Diagnosis not present

## 2015-01-31 DIAGNOSIS — H6123 Impacted cerumen, bilateral: Secondary | ICD-10-CM | POA: Diagnosis not present

## 2015-01-31 DIAGNOSIS — H9193 Unspecified hearing loss, bilateral: Secondary | ICD-10-CM | POA: Diagnosis not present

## 2015-01-31 DIAGNOSIS — I495 Sick sinus syndrome: Secondary | ICD-10-CM | POA: Diagnosis not present

## 2015-01-31 DIAGNOSIS — Z974 Presence of external hearing-aid: Secondary | ICD-10-CM | POA: Diagnosis not present

## 2015-01-31 LAB — MDC_IDC_ENUM_SESS_TYPE_INCLINIC
Battery Impedance: 111 Ohm
Battery Remaining Longevity: 144 mo
Battery Voltage: 2.8 V
Brady Statistic AP VP Percent: 0 %
Brady Statistic AP VS Percent: 58 %
Brady Statistic AS VP Percent: 0 %
Brady Statistic AS VS Percent: 42 %
Date Time Interrogation Session: 20160310120141
Lead Channel Impedance Value: 442 Ohm
Lead Channel Impedance Value: 682 Ohm
Lead Channel Pacing Threshold Amplitude: 0.5 V
Lead Channel Pacing Threshold Amplitude: 0.5 V
Lead Channel Pacing Threshold Pulse Width: 0.4 ms
Lead Channel Pacing Threshold Pulse Width: 0.4 ms
Lead Channel Sensing Intrinsic Amplitude: 15.67 mV
Lead Channel Sensing Intrinsic Amplitude: 4 mV
Lead Channel Setting Pacing Amplitude: 2 V
Lead Channel Setting Pacing Amplitude: 2.5 V
Lead Channel Setting Pacing Pulse Width: 0.4 ms
Lead Channel Setting Sensing Sensitivity: 5.6 mV

## 2015-01-31 NOTE — Progress Notes (Signed)
Pacemaker check in clinic. Normal device function. Thresholds, sensing, impedances consistent with previous measurements. Device programmed to maximize longevity. 135 mode switch episodes, + coumadin.  Total burden 0.5%.  8 VHR episodes the longest > 4 minutes with a max ventricular rate of 226bpm.   Device programmed at appropriate safety margins. Histogram distribution appropriate for patient activity level. Device programmed to optimize intrinsic conduction. Estimated longevity 12 years. Patient enrolled in remote follow-up/TTM's with [employer]. Plan to follow every 3 months remotely and see annually in office. Patient education completed.  ROV in June with Dr. Caryl Comes and also follows with [name].

## 2015-02-08 ENCOUNTER — Encounter: Payer: Self-pay | Admitting: Internal Medicine

## 2015-02-08 DIAGNOSIS — I480 Paroxysmal atrial fibrillation: Secondary | ICD-10-CM | POA: Diagnosis not present

## 2015-02-08 DIAGNOSIS — E039 Hypothyroidism, unspecified: Secondary | ICD-10-CM | POA: Diagnosis not present

## 2015-02-08 DIAGNOSIS — I951 Orthostatic hypotension: Secondary | ICD-10-CM | POA: Diagnosis not present

## 2015-02-08 DIAGNOSIS — E784 Other hyperlipidemia: Secondary | ICD-10-CM | POA: Diagnosis not present

## 2015-02-08 DIAGNOSIS — Z950 Presence of cardiac pacemaker: Secondary | ICD-10-CM | POA: Diagnosis not present

## 2015-02-08 DIAGNOSIS — Z7901 Long term (current) use of anticoagulants: Secondary | ICD-10-CM | POA: Diagnosis not present

## 2015-02-08 DIAGNOSIS — G4733 Obstructive sleep apnea (adult) (pediatric): Secondary | ICD-10-CM | POA: Diagnosis not present

## 2015-02-08 DIAGNOSIS — Z8673 Personal history of transient ischemic attack (TIA), and cerebral infarction without residual deficits: Secondary | ICD-10-CM | POA: Diagnosis not present

## 2015-02-11 DIAGNOSIS — I951 Orthostatic hypotension: Secondary | ICD-10-CM | POA: Diagnosis not present

## 2015-02-11 DIAGNOSIS — I480 Paroxysmal atrial fibrillation: Secondary | ICD-10-CM | POA: Diagnosis not present

## 2015-02-11 DIAGNOSIS — Z950 Presence of cardiac pacemaker: Secondary | ICD-10-CM | POA: Diagnosis not present

## 2015-02-11 DIAGNOSIS — E039 Hypothyroidism, unspecified: Secondary | ICD-10-CM | POA: Diagnosis not present

## 2015-02-11 DIAGNOSIS — G4733 Obstructive sleep apnea (adult) (pediatric): Secondary | ICD-10-CM | POA: Diagnosis not present

## 2015-02-11 DIAGNOSIS — Z8673 Personal history of transient ischemic attack (TIA), and cerebral infarction without residual deficits: Secondary | ICD-10-CM | POA: Diagnosis not present

## 2015-02-11 DIAGNOSIS — E784 Other hyperlipidemia: Secondary | ICD-10-CM | POA: Diagnosis not present

## 2015-02-11 DIAGNOSIS — Z7901 Long term (current) use of anticoagulants: Secondary | ICD-10-CM | POA: Diagnosis not present

## 2015-02-21 ENCOUNTER — Other Ambulatory Visit: Payer: Self-pay | Admitting: Physician Assistant

## 2015-02-25 DIAGNOSIS — E039 Hypothyroidism, unspecified: Secondary | ICD-10-CM | POA: Diagnosis not present

## 2015-02-25 DIAGNOSIS — E784 Other hyperlipidemia: Secondary | ICD-10-CM | POA: Diagnosis not present

## 2015-02-25 DIAGNOSIS — I480 Paroxysmal atrial fibrillation: Secondary | ICD-10-CM | POA: Diagnosis not present

## 2015-02-25 DIAGNOSIS — I951 Orthostatic hypotension: Secondary | ICD-10-CM | POA: Diagnosis not present

## 2015-02-25 DIAGNOSIS — G4733 Obstructive sleep apnea (adult) (pediatric): Secondary | ICD-10-CM | POA: Diagnosis not present

## 2015-02-25 DIAGNOSIS — Z7901 Long term (current) use of anticoagulants: Secondary | ICD-10-CM | POA: Diagnosis not present

## 2015-02-25 DIAGNOSIS — Z950 Presence of cardiac pacemaker: Secondary | ICD-10-CM | POA: Diagnosis not present

## 2015-02-25 DIAGNOSIS — Z8673 Personal history of transient ischemic attack (TIA), and cerebral infarction without residual deficits: Secondary | ICD-10-CM | POA: Diagnosis not present

## 2015-03-05 DIAGNOSIS — H903 Sensorineural hearing loss, bilateral: Secondary | ICD-10-CM | POA: Diagnosis not present

## 2015-03-08 ENCOUNTER — Encounter: Payer: Self-pay | Admitting: Nurse Practitioner

## 2015-03-08 ENCOUNTER — Ambulatory Visit (INDEPENDENT_AMBULATORY_CARE_PROVIDER_SITE_OTHER): Payer: Medicare Other | Admitting: Nurse Practitioner

## 2015-03-08 VITALS — BP 110/64 | HR 67 | Ht 68.0 in | Wt 173.8 lb

## 2015-03-08 DIAGNOSIS — I6789 Other cerebrovascular disease: Secondary | ICD-10-CM

## 2015-03-08 DIAGNOSIS — E785 Hyperlipidemia, unspecified: Secondary | ICD-10-CM | POA: Diagnosis not present

## 2015-03-08 DIAGNOSIS — I2510 Atherosclerotic heart disease of native coronary artery without angina pectoris: Secondary | ICD-10-CM

## 2015-03-08 DIAGNOSIS — G309 Alzheimer's disease, unspecified: Secondary | ICD-10-CM | POA: Diagnosis not present

## 2015-03-08 DIAGNOSIS — F0280 Dementia in other diseases classified elsewhere without behavioral disturbance: Secondary | ICD-10-CM

## 2015-03-08 MED ORDER — MEMANTINE HCL 10 MG PO TABS: 10.0000 mg | ORAL_TABLET | Freq: Two times a day (BID) | ORAL | Status: DC

## 2015-03-08 NOTE — Progress Notes (Signed)
[facility]  PATIENT: Paul Davies DOB: June 22, 1930   REASON FOR VISIT: Follow-up for history of stroke and memory loss  HISTORY FROM: Patient and wife    HISTORY OF PRESENT ILLNESS:Mr Geiselman, 30 year Caucasian male with long-standing history of mild dementia as well as remote right MCA branch infarct secondary to cardiac embolism from atrial fibrillation in June 2006 who is seen today for followup after last visit on 09/06/14. The patient is accompanied today by his wife they state his memory is stable . He however still remains quite independent in activities of daily living. He is quite active planted his tomato garden and mowes his yard with his tractor. He has no issues with agitation, delusions, hallucinations. He does go out independently and has never gotten lost. He has no issues with gait and has good balance and no falls He has been on Namenda 10 mg twice daily for several years. He has no new complaints today. His memory score is stable   REVIEW OF SYSTEMS: Full 14 system review of systems performed and notable only for those listed, all others are neg:  Constitutional: neg  Cardiovascular: neg Ear/Nose/Throat: Hearing loss Skin: neg Eyes: neg Respiratory: neg Gastroitestinal: Urinary frequency  Hematology/Lymphatic: neg  Endocrine: neg Musculoskeletal: Walking difficulty  Allergy/Immunology: Environmental allergies Neurological: Memory loss Psychiatric: neg Sleep : Obstructive sleep apnea, restless legs   ALLERGIES: Allergies  Allergen Reactions  . Bee Venom     HOME MEDICATIONS: Outpatient Prescriptions Prior to Visit  Medication Sig Dispense Refill  . hydrocortisone 2.5 % cream Apply 1 application topically daily. To face    . ketoconazole (NIZORAL) 2 % cream Apply 1 application topically 2 (two) times daily as needed for irritation. 30 g 2  . levothyroxine (SYNTHROID, LEVOTHROID) 50 MCG tablet Take 1 tablet (50 mcg total) by mouth  daily before breakfast. 90 tablet 1  . memantine (NAMENDA) 10 MG tablet Take 1 tablet (10 mg total) by mouth 2 (two) times daily. 60 tablet 6  . warfarin (COUMADIN) 6 MG tablet Take 3-9 mg by mouth daily. Take 1.5 tablets every Monday, Wednesday, and Friday. Take 0.5 tablet every Tuesday, Thursday, Saturday and Sunday.    . cholecalciferol (VITAMIN D) 1000 UNITS tablet Take 2,000 Units by mouth daily.     . tamsulosin (FLOMAX) 0.4 MG CAPS capsule Take 1 capsule (0.4 mg total) by mouth daily. (Patient not taking: Reported on 03/08/2015) 90 capsule 3  . levothyroxine (SYNTHROID, LEVOTHROID) 50 MCG tablet take 1 tablet by mouth once daily BEFORE BREAKFAST 30 tablet 3   No facility-administered medications prior to visit.    PAST MEDICAL HISTORY: Past Medical History  Diagnosis Date  . Stroke     twice  . Hyperlipidemia   . Hypertension   . OSA (obstructive sleep apnea)   . RLS (restless legs syndrome)   . Osteopenia   . Thyroid disease   . Vitamin D deficiency   . Pacemaker medtronic   . DJD (degenerative joint disease)   . Atherosclerosis of aorta 2010    noted on lumbar CT in 2010  . COPD (chronic obstructive pulmonary disease)     On CXR  . Sinoatrial node dysfunction   . Prediabetes   . Atrial fibrillation     PAST SURGICAL HISTORY: Past Surgical History  Procedure Laterality Date  . Pacemaker insertion    . Pacemaker generator change N/A 05/04/2014    Procedure: PACEMAKER GENERATOR CHANGE;  Surgeon: [name], MD;  Location: [facility] CATH LAB;  Service: Cardiovascular;  Laterality: N/A;    FAMILY HISTORY: Family History  Problem Relation Age of Onset  . Heart attack Father     SOCIAL HISTORY: History   Social History  . Marital Status: Married    Spouse Name: N/A  . Number of Children: 5  . Years of Education: 11   Occupational History  . Retired    Social History Main Topics  . Smoking status: Never Smoker   . Smokeless tobacco: Never Used  . Alcohol Use:  No  . Drug Use: No  . Sexual Activity: Not on file   Other Topics Concern  . Not on file   Social History Narrative   Patient lives at home with his wife    Patient drinks coffee daily     PHYSICAL EXAM  Filed Vitals:   03/08/15 0842  BP: 110/64  Pulse: 67  Height: '5\' 8"'  (1.727 m)  Weight: 173 lb 12.8 oz (78.835 kg)   Body mass index is 26.43 kg/(m^2). General: well developed, well nourished elderly Caucasian male, seated, in no evident distress  Head: head normocephalic and atraumatic. Orohparynx benign  Neck: supple with no carotid or supraclavicular bruits  Musculoskeletal: no deformity  Skin: no rash/petichiae  Vascular: Normal pulses all extremities  Neurologic Exam  Mental Status: Awake and fully alert. Oriented to place and time. Recent and remote memory diminished. Mini-Mental status exam score 29/30 missing 1 of 3 recall only.  Animal naming test 13. Clock drawing 4/4.  Attention span, concentration and fund of knowledge appropriate. Mood and affect appropriate.  Cranial Nerves: Fundoscopic exam reveals sharp disc margins. Pupils equal, briskly reactive to light. Extraocular movements full without nystagmus. Visual fields full to confrontation. Hard of hearing. Facial sensation intact. Face, tongue, palate moves normally and symmetrically.  Motor: Normal bulk and tone. Normal strength in all tested extremity muscles.  Sensory.: intact to touch and pinprick and vibratory sensation.  Coordination: Rapid alternating movements normal in all extremities. Finger-to-nose and heel-to-shin performed accurately bilaterally.  Gait and Station: Arises from chair without difficulty. Stance is stooped. Gait demonstrates short stride length unable to heel, toe and tandem walk without difficulty.  Reflexes: 1+ and symmetric. Toes downgoing.    DIAGNOSTIC DATA (LABS, IMAGING, TESTING) - I reviewed patient records, labs, notes, testing and imaging myself where  available.  Lab Results  Component Value Date   WBC 7.8 12/19/2014   HGB 13.9 12/19/2014   HCT 41.0 12/19/2014   MCV 89.1 12/19/2014   PLT 197 12/19/2014      Component Value Date/Time   NA 138 12/19/2014 1135   K 4.8 12/19/2014 1135   CL 104 12/19/2014 1135   CO2 28 12/19/2014 1135   GLUCOSE 90 12/19/2014 1135   BUN 19 12/19/2014 1135   CREATININE 0.92 12/19/2014 1135   CREATININE 1.1 05/01/2014 1037   CALCIUM 9.1 12/19/2014 1135   PROT 6.8 12/19/2014 1135   ALBUMIN 4.2 12/19/2014 1135   AST 21 12/19/2014 1135   ALT 19 12/19/2014 1135   ALKPHOS 65 12/19/2014 1135   BILITOT 0.6 12/19/2014 1135   GFRNONAA 77 12/19/2014 1135   GFRNONAA >60 02/21/2008 1206   GFRAA 89 12/19/2014 1135   GFRAA  02/21/2008 1206    >60        The eGFR has been calculated using the MDRD equation. This calculation has not been validated in all clinical   Lab Results  Component Value Date   CHOL  207* 12/19/2014   HDL 59 12/19/2014   LDLCALC 101* 12/19/2014   TRIG 236* 12/19/2014   CHOLHDL 3.5 12/19/2014   Lab Results  Component Value Date   HGBA1C 5.8* 12/19/2014   No results found for: [id number] Lab Results  Component Value Date   TSH 3.308 12/19/2014      ASSESSMENT AND PLAN  81 y.o. year old male  has a past medical history of Stroke; Hyperlipidemia; Hypertension; OSA (obstructive sleep apnea); RLS (restless legs syndrome);Pacemaker medtronic; DJD (degenerative joint disease); Atherosclerosis of aorta (2010); COPD (chronic obstructive pulmonary disease); Sinoatrial node dysfunction; Prediabetes; and Atrial fibrillation. hto follow-up  Memory score is stable Continue Namenda 10 twice daily will refill Continue Coumadin for secondary stroke prevention and atrial fibrillation Keep systolic blood pressure less than 130, today's reading 110/64 Reviewed recent lipid panel, strive  to keep cholesterol less than 200 LDL less than 100 Moderate exercise for overall health and  well-being at least 5 times a week Follow-up in 6 months Dennie Bible, [facility], [facility], [facility] [address], [place], [place] [address] [phone]

## 2015-03-08 NOTE — Progress Notes (Signed)
I agree with the above plan 

## 2015-03-08 NOTE — Patient Instructions (Signed)
Memory score is stable Continue Namenda 10 twice daily will refill Continue Coumadin for secondary stroke prevention and atrial fibrillation Follow-up in 6 months

## 2015-03-11 DIAGNOSIS — I951 Orthostatic hypotension: Secondary | ICD-10-CM | POA: Diagnosis not present

## 2015-03-11 DIAGNOSIS — E784 Other hyperlipidemia: Secondary | ICD-10-CM | POA: Diagnosis not present

## 2015-03-11 DIAGNOSIS — Z950 Presence of cardiac pacemaker: Secondary | ICD-10-CM | POA: Diagnosis not present

## 2015-03-11 DIAGNOSIS — E039 Hypothyroidism, unspecified: Secondary | ICD-10-CM | POA: Diagnosis not present

## 2015-03-11 DIAGNOSIS — G4733 Obstructive sleep apnea (adult) (pediatric): Secondary | ICD-10-CM | POA: Diagnosis not present

## 2015-03-11 DIAGNOSIS — Z8673 Personal history of transient ischemic attack (TIA), and cerebral infarction without residual deficits: Secondary | ICD-10-CM | POA: Diagnosis not present

## 2015-03-11 DIAGNOSIS — Z7901 Long term (current) use of anticoagulants: Secondary | ICD-10-CM | POA: Diagnosis not present

## 2015-03-11 DIAGNOSIS — I480 Paroxysmal atrial fibrillation: Secondary | ICD-10-CM | POA: Diagnosis not present

## 2015-03-14 DIAGNOSIS — L821 Other seborrheic keratosis: Secondary | ICD-10-CM | POA: Diagnosis not present

## 2015-03-14 DIAGNOSIS — L570 Actinic keratosis: Secondary | ICD-10-CM | POA: Diagnosis not present

## 2015-03-14 DIAGNOSIS — Z85828 Personal history of other malignant neoplasm of skin: Secondary | ICD-10-CM | POA: Diagnosis not present

## 2015-03-14 DIAGNOSIS — D485 Neoplasm of uncertain behavior of skin: Secondary | ICD-10-CM | POA: Diagnosis not present

## 2015-03-14 DIAGNOSIS — C44311 Basal cell carcinoma of skin of nose: Secondary | ICD-10-CM | POA: Diagnosis not present

## 2015-03-25 ENCOUNTER — Ambulatory Visit: Payer: Medicare Other | Admitting: Internal Medicine

## 2015-03-25 ENCOUNTER — Ambulatory Visit (INDEPENDENT_AMBULATORY_CARE_PROVIDER_SITE_OTHER): Payer: Medicare Other | Admitting: Internal Medicine

## 2015-03-25 ENCOUNTER — Encounter: Payer: Self-pay | Admitting: Internal Medicine

## 2015-03-25 VITALS — BP 118/72 | HR 64 | Temp 97.0°F | Resp 16 | Ht 68.0 in | Wt 173.8 lb

## 2015-03-25 DIAGNOSIS — Z79899 Other long term (current) drug therapy: Secondary | ICD-10-CM | POA: Diagnosis not present

## 2015-03-25 DIAGNOSIS — I2510 Atherosclerotic heart disease of native coronary artery without angina pectoris: Secondary | ICD-10-CM

## 2015-03-25 DIAGNOSIS — E559 Vitamin D deficiency, unspecified: Secondary | ICD-10-CM | POA: Diagnosis not present

## 2015-03-25 DIAGNOSIS — R7303 Prediabetes: Secondary | ICD-10-CM

## 2015-03-25 DIAGNOSIS — I482 Chronic atrial fibrillation, unspecified: Secondary | ICD-10-CM

## 2015-03-25 DIAGNOSIS — E785 Hyperlipidemia, unspecified: Secondary | ICD-10-CM | POA: Diagnosis not present

## 2015-03-25 DIAGNOSIS — I10 Essential (primary) hypertension: Secondary | ICD-10-CM | POA: Diagnosis not present

## 2015-03-25 DIAGNOSIS — R7309 Other abnormal glucose: Secondary | ICD-10-CM | POA: Diagnosis not present

## 2015-03-25 DIAGNOSIS — E782 Mixed hyperlipidemia: Secondary | ICD-10-CM | POA: Diagnosis not present

## 2015-03-25 LAB — LIPID PANEL
CHOL/HDL RATIO: 3.8 ratio
Cholesterol: 211 mg/dL — ABNORMAL HIGH (ref 0–200)
HDL: 55 mg/dL (ref >=40)
LDL Cholesterol: 111 mg/dL — ABNORMAL HIGH (ref 0–99)
Triglycerides: 226 mg/dL — ABNORMAL HIGH (ref <150)
VLDL: 45 mg/dL — ABNORMAL HIGH (ref 0–40)

## 2015-03-25 LAB — HEPATIC FUNCTION PANEL
ALBUMIN: 3.9 g/dL (ref 3.5–5.2)
ALT: 13 U/L (ref 0–53)
AST: 14 U/L (ref 0–37)
Alkaline Phosphatase: 58 U/L (ref 39–117)
BILIRUBIN DIRECT: 0.1 mg/dL (ref 0.0–0.3)
Indirect Bilirubin: 0.3 mg/dL (ref 0.2–1.2)
TOTAL PROTEIN: 6.7 g/dL (ref 6.0–8.3)
Total Bilirubin: 0.4 mg/dL (ref 0.2–1.2)

## 2015-03-25 LAB — HEMOGLOBIN A1C
Hgb A1c MFr Bld: 6 % — ABNORMAL HIGH (ref <5.7)
MEAN PLASMA GLUCOSE: 126 mg/dL — AB (ref <117)

## 2015-03-25 LAB — BASIC METABOLIC PANEL WITH GFR
BUN: 13 mg/dL (ref 6–23)
CALCIUM: 9.3 mg/dL (ref 8.4–10.5)
CHLORIDE: 106 meq/L (ref 96–112)
CO2: 27 mEq/L (ref 19–32)
CREATININE: 0.87 mg/dL (ref 0.50–1.35)
GFR, EST NON AFRICAN AMERICAN: 80 mL/min
Glucose, Bld: 87 mg/dL (ref 70–99)
Potassium: 4.7 mEq/L (ref 3.5–5.3)
Sodium: 140 mEq/L (ref 135–145)

## 2015-03-25 LAB — MAGNESIUM: MAGNESIUM: 2.1 mg/dL (ref 1.5–2.5)

## 2015-03-25 LAB — TSH: TSH: 1.136 u[IU]/mL (ref 0.350–4.500)

## 2015-03-25 NOTE — Patient Instructions (Signed)

## 2015-03-25 NOTE — Progress Notes (Signed)
Patient ID: Paul Davies, male   DOB: 05-01-1930, 81 y.o.   MRN: [id number]   This very nice 81 y.o. MWM presents for follow up with Hypertension, ASHD/cAfib,  Hyperlipidemia, Pre-Diabetes and Vitamin D Deficiency.    Patient is treated for HTN & BP has been controlled at home. Today's BP: 118/72 mmHg. Patient has SSS & chAfib with a pacemaker followed by Dr Wynonia Lawman and is on Coumadin followed at the [facility] coumadin clinic. Patient has had no complaints of any cardiac type chest pain, palpitations, dyspnea/orthopnea/PND, dizziness, claudication, or dependent edema.   Hyperlipidemia is controlled with diet.  Last Lipids were near goal - Total  Chol207*; HDL 59; LDL 101; and elevatedTrig 236 on 12/19/2014.   Also, the patient has history of PreDiabetes and has had no symptoms of reactive hypoglycemia, diabetic polys, paresthesias or visual blurring.  Last A1c was  5.8% on 12/19/2014.    Further, the patient also has history of Vitamin D Deficiency of 40 in 2015  and supplements vitamin D without any suspected side-effects. Last vitamin D was  47 in Apr 2015.   Medication Sig  . VITAMIN D 1000 UNITS tablet Take 2,000 Units  daily.   . fluticasone (FLONASE)  nasal spray   . hydrocortisone 2.5 % cream Apply 1 application topically daily.  Marland Kitchen ketoconazole (NIZORAL) 2 % cream Apply 1 application topically 2  times daily as needed for irritation.  Marland Kitchen levothyroxine  50 MCG  Take 1 tablet (50 mcg total)  daily before breakfast.  . NAMENDA XR 28 MG CP24 24 hr cap   . tamsulosin 0.4 MG CAPS capsule Take 1 cap daily.  Marland Kitchen warfarin (COUMADIN) 6 MG tablet Take 1.5 tab every Mon, Wed, and Fri. Take 0.5 tab every Tues, Thurs, Sat and Sun.   Allergies  Allergen Reactions  . Bee Venom    PMHx:   Past Medical History  Diagnosis Date  . Stroke     twice  . Hyperlipidemia   . Hypertension   . OSA (obstructive sleep apnea)   . RLS (restless legs syndrome)   . Osteopenia   . Thyroid disease   . Vitamin D  deficiency   . Pacemaker medtronic   . DJD (degenerative joint disease)   . Atherosclerosis of aorta 2010    noted on lumbar CT in 2010  . COPD (chronic obstructive pulmonary disease)     On CXR  . Sinoatrial node dysfunction   . Prediabetes   . Atrial fibrillation    Immunization History  Administered Date(s) Administered  . Influenza, High Dose Seasonal PF 09/18/2014  . Influenza-Unspecified 10/04/2013  . Pneumococcal Conjugate-13 12/19/2014  . Pneumococcal Polysaccharide-23 10/17/2013  . Td 12/08/2012   Past Surgical History  Procedure Laterality Date  . Pacemaker insertion    . Pacemaker generator change N/A 05/04/2014    Procedure: PACEMAKER GENERATOR CHANGE;  Surgeon: [name], MD;  Location: [facility] CATH LAB;  Service: Cardiovascular;  Laterality: N/A;   FHx:    Reviewed / unchanged  SHx:    Reviewed / unchanged  Systems Review:  Constitutional: Denies fever, chills, wt changes, headaches, insomnia, fatigue, night sweats, change in appetite. Eyes: Denies redness, blurred vision, diplopia, discharge, itchy, watery eyes.  ENT: Denies discharge, congestion, post nasal drip, epistaxis, sore throat, earache, hearing loss, dental pain, tinnitus, vertigo, sinus pain, snoring.  CV: Denies chest pain, palpitations, irregular heartbeat, syncope, dyspnea, diaphoresis, orthopnea, PND, claudication or edema. Respiratory: denies cough, dyspnea, DOE, pleurisy, hoarseness, laryngitis,  wheezing.  Gastrointestinal: Denies dysphagia, odynophagia, heartburn, reflux, water brash, abdominal pain or cramps, nausea, vomiting, bloating, diarrhea, constipation, hematemesis, melena, hematochezia  or hemorrhoids. Genitourinary: Denies dysuria, frequency, urgency, nocturia, hesitancy, discharge, hematuria or flank pain. Musculoskeletal: Denies arthralgias, myalgias, stiffness, jt. swelling, pain, limping or strain/sprain.  Skin: Denies pruritus, rash, hives, warts, acne, eczema or change in skin  lesion(s). Neuro: No weakness, tremor, incoordination, spasms, paresthesia or pain. Psychiatric: Denies confusion, memory loss or sensory loss. Endo: Denies change in weight, skin or hair change.  Heme/Lymph: No excessive bleeding, bruising or enlarged lymph nodes.  Physical Exam  BP 118/72   Pulse 64  Temp 97 F   Resp 16  Ht 5\' 8"    Wt 173 lb 12.8 oz     BMI 26.43  Appears well nourished and in no distress. Eyes: PERRLA, EOMs, conjunctiva no swelling or erythema. Sinuses: No frontal/maxillary tenderness ENT/Mouth: EAC's clear, TM's nl w/o erythema, bulging. Nares clear w/o erythema, swelling, exudates. Oropharynx clear without erythema or exudates. Oral hygiene is good. Tongue normal, non obstructing. Hearing intact.  Neck: Supple. Thyroid nl. Car 2+/2+ without bruits, nodes or JVD. Chest: Respirations nl with BS clear & equal w/o rales, rhonchi, wheezing or stridor.  Cor: Heart sounds normal w/ regular rate and rhythm without sig. murmurs, gallops, clicks, or rubs. Peripheral pulses normal and equal  without edema.  Abdomen: Soft & bowel sounds normal. Non-tender w/o guarding, rebound, hernias, masses, or organomegaly.  Lymphatics: Unremarkable.  Musculoskeletal: Full ROM all peripheral extremities, joint stability, 5/5 strength, and normal gait.  Skin: Warm, dry without exposed rashes, lesions or ecchymosis apparent.  Neuro: Cranial nerves intact, reflexes equal bilaterally. Sensory-motor testing grossly intact. Tendon reflexes grossly intact.  Pysch: Alert & oriented x 3.  Insight and judgement nl & appropriate. No ideations.  Assessment and Plan:  1. Essential hypertension  - TSH  2. Hyperlipidemia  - Lipid panel  3. Prediabetes  - Hemoglobin A1c - Insulin, random  4. Vitamin D deficiency  - Vit D  25 hydroxy (rtn osteoporosis monitoring)  5. Chronic atrial fibrillation   6. Atherosclerosis of native coronary artery of native heart without angina  pectoris   7. Encounter for long-term (current) use of medications  - CBC with Differential/Platelet - BASIC METABOLIC PANEL WITH GFR - Hepatic function panel - Magnesium   Recommended regular exercise, BP monitoring, weight control, and discussed med and SE's. Recommended labs to assess and monitor clinical status. Further disposition pending results of labs. Over 30 minutes of exam, counseling, chart review was performed

## 2015-03-26 ENCOUNTER — Encounter: Payer: Self-pay | Admitting: Internal Medicine

## 2015-03-26 LAB — CBC WITH DIFFERENTIAL/PLATELET
BASOS PCT: 0 % (ref 0–1)
Basophils Absolute: 0 10*3/uL (ref 0.0–0.1)
EOS ABS: 0.3 10*3/uL (ref 0.0–0.7)
EOS PCT: 4 % (ref 0–5)
HCT: 41.3 % (ref 39.0–52.0)
HEMOGLOBIN: 13.5 g/dL (ref 13.0–17.0)
LYMPHS ABS: 1.5 10*3/uL (ref 0.7–4.0)
Lymphocytes Relative: 24 % (ref 12–46)
MCH: 29.7 pg (ref 26.0–34.0)
MCHC: 32.7 g/dL (ref 30.0–36.0)
MCV: 90.8 fL (ref 78.0–100.0)
MONOS PCT: 8 % (ref 3–12)
MPV: 9.9 fL (ref 8.6–12.4)
Monocytes Absolute: 0.5 10*3/uL (ref 0.1–1.0)
NEUTROS ABS: 4 10*3/uL (ref 1.7–7.7)
Neutrophils Relative %: 64 % (ref 43–77)
Platelets: 163 10*3/uL (ref 150–400)
RBC: 4.55 MIL/uL (ref 4.22–5.81)
RDW: 14.1 % (ref 11.5–15.5)
WBC: 6.3 10*3/uL (ref 4.0–10.5)

## 2015-03-26 LAB — VITAMIN D 25 HYDROXY (VIT D DEFICIENCY, FRACTURES): Vit D, 25-Hydroxy: 46 ng/mL (ref 30–100)

## 2015-03-26 LAB — INSULIN, RANDOM: Insulin: 5.4 u[IU]/mL (ref 2.0–19.6)

## 2015-03-26 NOTE — Progress Notes (Signed)
Patient ID: Paul Davies, male   DOB: 1930/11/17, 81 y.o.   MRN: [id number] Done

## 2015-04-03 DIAGNOSIS — C44311 Basal cell carcinoma of skin of nose: Secondary | ICD-10-CM | POA: Diagnosis not present

## 2015-04-10 DIAGNOSIS — Z8673 Personal history of transient ischemic attack (TIA), and cerebral infarction without residual deficits: Secondary | ICD-10-CM | POA: Diagnosis not present

## 2015-04-10 DIAGNOSIS — G4733 Obstructive sleep apnea (adult) (pediatric): Secondary | ICD-10-CM | POA: Diagnosis not present

## 2015-04-10 DIAGNOSIS — Z7901 Long term (current) use of anticoagulants: Secondary | ICD-10-CM | POA: Diagnosis not present

## 2015-04-10 DIAGNOSIS — E039 Hypothyroidism, unspecified: Secondary | ICD-10-CM | POA: Diagnosis not present

## 2015-04-10 DIAGNOSIS — Z950 Presence of cardiac pacemaker: Secondary | ICD-10-CM | POA: Diagnosis not present

## 2015-04-10 DIAGNOSIS — I480 Paroxysmal atrial fibrillation: Secondary | ICD-10-CM | POA: Diagnosis not present

## 2015-04-10 DIAGNOSIS — E784 Other hyperlipidemia: Secondary | ICD-10-CM | POA: Diagnosis not present

## 2015-04-10 DIAGNOSIS — I951 Orthostatic hypotension: Secondary | ICD-10-CM | POA: Diagnosis not present

## 2015-04-25 ENCOUNTER — Encounter: Payer: Self-pay | Admitting: Gastroenterology

## 2015-04-25 DIAGNOSIS — H903 Sensorineural hearing loss, bilateral: Secondary | ICD-10-CM | POA: Diagnosis not present

## 2015-05-06 DIAGNOSIS — Z8673 Personal history of transient ischemic attack (TIA), and cerebral infarction without residual deficits: Secondary | ICD-10-CM | POA: Diagnosis not present

## 2015-05-06 DIAGNOSIS — Z7901 Long term (current) use of anticoagulants: Secondary | ICD-10-CM | POA: Diagnosis not present

## 2015-05-06 DIAGNOSIS — G4733 Obstructive sleep apnea (adult) (pediatric): Secondary | ICD-10-CM | POA: Diagnosis not present

## 2015-05-06 DIAGNOSIS — E039 Hypothyroidism, unspecified: Secondary | ICD-10-CM | POA: Diagnosis not present

## 2015-05-06 DIAGNOSIS — I480 Paroxysmal atrial fibrillation: Secondary | ICD-10-CM | POA: Diagnosis not present

## 2015-05-06 DIAGNOSIS — I951 Orthostatic hypotension: Secondary | ICD-10-CM | POA: Diagnosis not present

## 2015-05-06 DIAGNOSIS — Z950 Presence of cardiac pacemaker: Secondary | ICD-10-CM | POA: Diagnosis not present

## 2015-05-06 DIAGNOSIS — E784 Other hyperlipidemia: Secondary | ICD-10-CM | POA: Diagnosis not present

## 2015-05-08 DIAGNOSIS — R310 Gross hematuria: Secondary | ICD-10-CM | POA: Diagnosis not present

## 2015-05-09 DIAGNOSIS — Z7901 Long term (current) use of anticoagulants: Secondary | ICD-10-CM | POA: Diagnosis not present

## 2015-05-09 DIAGNOSIS — I480 Paroxysmal atrial fibrillation: Secondary | ICD-10-CM | POA: Diagnosis not present

## 2015-05-09 DIAGNOSIS — N281 Cyst of kidney, acquired: Secondary | ICD-10-CM | POA: Diagnosis not present

## 2015-05-09 DIAGNOSIS — E039 Hypothyroidism, unspecified: Secondary | ICD-10-CM | POA: Diagnosis not present

## 2015-05-09 DIAGNOSIS — E784 Other hyperlipidemia: Secondary | ICD-10-CM | POA: Diagnosis not present

## 2015-05-09 DIAGNOSIS — Z8673 Personal history of transient ischemic attack (TIA), and cerebral infarction without residual deficits: Secondary | ICD-10-CM | POA: Diagnosis not present

## 2015-05-09 DIAGNOSIS — Z950 Presence of cardiac pacemaker: Secondary | ICD-10-CM | POA: Diagnosis not present

## 2015-05-09 DIAGNOSIS — N3289 Other specified disorders of bladder: Secondary | ICD-10-CM | POA: Diagnosis not present

## 2015-05-09 DIAGNOSIS — G4733 Obstructive sleep apnea (adult) (pediatric): Secondary | ICD-10-CM | POA: Diagnosis not present

## 2015-05-09 DIAGNOSIS — R310 Gross hematuria: Secondary | ICD-10-CM | POA: Diagnosis not present

## 2015-05-09 DIAGNOSIS — I951 Orthostatic hypotension: Secondary | ICD-10-CM | POA: Diagnosis not present

## 2015-05-14 ENCOUNTER — Ambulatory Visit (INDEPENDENT_AMBULATORY_CARE_PROVIDER_SITE_OTHER): Payer: Medicare Other | Admitting: Internal Medicine

## 2015-05-14 ENCOUNTER — Encounter: Payer: Self-pay | Admitting: Internal Medicine

## 2015-05-14 VITALS — BP 118/60 | HR 79 | Ht 68.0 in | Wt 172.4 lb

## 2015-05-14 DIAGNOSIS — I2510 Atherosclerotic heart disease of native coronary artery without angina pectoris: Secondary | ICD-10-CM | POA: Diagnosis not present

## 2015-05-14 DIAGNOSIS — I480 Paroxysmal atrial fibrillation: Secondary | ICD-10-CM

## 2015-05-14 DIAGNOSIS — I495 Sick sinus syndrome: Secondary | ICD-10-CM

## 2015-05-14 DIAGNOSIS — Z950 Presence of cardiac pacemaker: Secondary | ICD-10-CM | POA: Diagnosis not present

## 2015-05-14 DIAGNOSIS — Z45018 Encounter for adjustment and management of other part of cardiac pacemaker: Secondary | ICD-10-CM

## 2015-05-14 LAB — CUP PACEART INCLINIC DEVICE CHECK
Battery Impedance: 134 Ohm
Battery Remaining Longevity: 148 mo
Battery Voltage: 2.8 V
Brady Statistic AP VS Percent: 56 %
Brady Statistic AP VS Percent: 56 %
Brady Statistic AS VP Percent: 0 %
Brady Statistic AS VP Percent: 0 %
Brady Statistic AS VS Percent: 44 %
Brady Statistic AS VS Percent: 44 %
Date Time Interrogation Session: 20160621170835
Lead Channel Impedance Value: 689 Ohm
Lead Channel Impedance Value: 689 Ohm
Lead Channel Pacing Threshold Amplitude: 0.5 V
Lead Channel Pacing Threshold Amplitude: 0.5 V
Lead Channel Pacing Threshold Amplitude: 1 V
Lead Channel Pacing Threshold Amplitude: 1 V
Lead Channel Pacing Threshold Pulse Width: 0.4 ms
Lead Channel Pacing Threshold Pulse Width: 0.4 ms
Lead Channel Sensing Intrinsic Amplitude: 22.4 mV
Lead Channel Sensing Intrinsic Amplitude: 22.4 mV
Lead Channel Sensing Intrinsic Amplitude: 5.6 mV
Lead Channel Setting Pacing Amplitude: 2.5 V
Lead Channel Setting Pacing Pulse Width: 0.4 ms
Lead Channel Setting Pacing Pulse Width: 0.4 ms
Lead Channel Setting Sensing Sensitivity: 5.6 mV
Lead Channel Setting Sensing Sensitivity: 5.6 mV
MDC IDC MSMT BATTERY IMPEDANCE: 134 Ohm
MDC IDC MSMT BATTERY REMAINING LONGEVITY: 148 mo
MDC IDC MSMT BATTERY VOLTAGE: 2.8 V
MDC IDC MSMT LEADCHNL RA IMPEDANCE VALUE: 449 Ohm
MDC IDC MSMT LEADCHNL RA IMPEDANCE VALUE: 449 Ohm
MDC IDC MSMT LEADCHNL RA SENSING INTR AMPL: 5.6 mV
MDC IDC MSMT LEADCHNL RV PACING THRESHOLD PULSEWIDTH: 0.4 ms
MDC IDC MSMT LEADCHNL RV PACING THRESHOLD PULSEWIDTH: 0.4 ms
MDC IDC SESS DTM: 20160621170603
MDC IDC SET LEADCHNL RA PACING AMPLITUDE: 2 V
MDC IDC SET LEADCHNL RA PACING AMPLITUDE: 2 V
MDC IDC SET LEADCHNL RV PACING AMPLITUDE: 2.5 V
MDC IDC STAT BRADY AP VP PERCENT: 0 %
MDC IDC STAT BRADY AP VP PERCENT: 0 %

## 2015-05-14 NOTE — Progress Notes (Signed)
Patient Care Team: Unk Pinto, MD as PCP - General (Internal Medicine) Jacolyn Reedy, MD as Consulting Physician (Cardiology) Rana Snare, MD as Consulting Physician (Urology) Garvin Fila, MD as Consulting Physician (Neurology)   HPI  Paul Davies is a 81 y.o. male Seen in followup for pacemaker implanted for sinus node dysfunction and underwent generator revision by me 6/15. He been previously seen by [name]. He is followed regularly by [name]   Past Medical History  Diagnosis Date  . Stroke     twice  . Hyperlipidemia   . Hypertension   . OSA (obstructive sleep apnea)   . RLS (restless legs syndrome)   . Osteopenia   . Thyroid disease   . Vitamin D deficiency   . Pacemaker medtronic   . DJD (degenerative joint disease)   . Atherosclerosis of aorta 2010    noted on lumbar CT in 2010  . COPD (chronic obstructive pulmonary disease)     On CXR  . Sinoatrial node dysfunction   . Prediabetes   . Atrial fibrillation     Past Surgical History  Procedure Laterality Date  . Pacemaker insertion    . Pacemaker generator change N/A 05/04/2014    Procedure: PACEMAKER GENERATOR CHANGE;  Surgeon: [name], MD;  Location: [facility] CATH LAB;  Service: Cardiovascular;  Laterality: N/A;    Current Outpatient Prescriptions  Medication Sig Dispense Refill  . cholecalciferol (VITAMIN D) 1000 UNITS tablet Take 2,000 Units by mouth daily.     . hydrocortisone 2.5 % cream Apply 1 application topically daily. To face    . ketoconazole (NIZORAL) 2 % cream Apply 1 application topically 2 (two) times daily as needed for irritation. 30 g 2  . levothyroxine (SYNTHROID, LEVOTHROID) 50 MCG tablet Take 1 tablet (50 mcg total) by mouth daily before breakfast. 90 tablet 1  . NAMENDA XR 28 MG CP24 24 hr capsule     . tamsulosin (FLOMAX) 0.4 MG CAPS capsule Take 1 capsule (0.4 mg total) by mouth daily. 90 capsule 3  . warfarin (COUMADIN) 6 MG tablet Take 3-9 mg by mouth  daily. Take 1.5 tablets every Monday, Wednesday, and Friday. Take 0.5 tablet every Tuesday, Thursday, Saturday and Sunday.     No current facility-administered medications for this visit.    Allergies  Allergen Reactions  . Bee Venom     Review of Systems negative except from HPI and PMH  Physical Exam BP 118/60 mmHg  Pulse 79  Ht 5\' 8"  (1.727 m)  Wt 172 lb 6.4 oz (78.2 kg)  BMI 26.22 kg/m2 Well developed and well nourished in no acute distress HENT normal E scleral and icterus clear Neck Supple JVP flat; carotids brisk and full Clear to ausculation  Regular rate and rhythm,  Soft with active bowel sounds No clubbing cyanosis  Edema Alert and oriented, grossly normal motor and sensory function Skin Warm and Dry  ECG demonstrates atrial pacing with atrial bigeminy  Assessment and  Plan  Atrial fibrillation  Sinus node dysfunction  Pacemaker  Medtronic  Atrial ectopy   Pt is on anticoagulation   We discussed the use of the NOACs compared to Coumadin. We briefly reviewed the data of at least comparability in stroke prevention, bleeding and outcome. We discussed some of the new once wherein somewhat associated with decreased ischemic stroke risk, one to be taken daily, and has been shown to be comparable and bleeding risk to aspirin.  We  also discussed bleeding associated with warfarin as well as NOACs and a wall bleeding as a complication of all these drugs intracranial bleeding is more frequently associated with warfarin then the NOACs and a GI bleeding is more commonly associated with the latter  Will defer decision to Dr Judith Part  Also will ask of him whether he wants [place] to continue to followup his pacemaker

## 2015-05-14 NOTE — Patient Instructions (Signed)
Medication Instructions:  Your physician recommends that you continue on your current medications as directed. Please refer to the Current Medication list given to you today.  Labwork: None ordered  Testing/Procedures: None ordered  Follow-Up: Your physician wants you to follow-up in: 1 year with Chanetta Marshall, NP.  You will receive a reminder letter in the mail two months in advance. If you don't receive a letter, please call our office to schedule the follow-up appointment.  Thank you for choosing [facility]!!

## 2015-05-16 DIAGNOSIS — G4733 Obstructive sleep apnea (adult) (pediatric): Secondary | ICD-10-CM | POA: Diagnosis not present

## 2015-05-16 DIAGNOSIS — N138 Other obstructive and reflux uropathy: Secondary | ICD-10-CM | POA: Diagnosis not present

## 2015-05-16 DIAGNOSIS — Z7901 Long term (current) use of anticoagulants: Secondary | ICD-10-CM | POA: Diagnosis not present

## 2015-05-16 DIAGNOSIS — Z950 Presence of cardiac pacemaker: Secondary | ICD-10-CM | POA: Diagnosis not present

## 2015-05-16 DIAGNOSIS — E784 Other hyperlipidemia: Secondary | ICD-10-CM | POA: Diagnosis not present

## 2015-05-16 DIAGNOSIS — I480 Paroxysmal atrial fibrillation: Secondary | ICD-10-CM | POA: Diagnosis not present

## 2015-05-16 DIAGNOSIS — Z8673 Personal history of transient ischemic attack (TIA), and cerebral infarction without residual deficits: Secondary | ICD-10-CM | POA: Diagnosis not present

## 2015-05-16 DIAGNOSIS — N401 Enlarged prostate with lower urinary tract symptoms: Secondary | ICD-10-CM | POA: Diagnosis not present

## 2015-05-16 DIAGNOSIS — E039 Hypothyroidism, unspecified: Secondary | ICD-10-CM | POA: Diagnosis not present

## 2015-05-16 DIAGNOSIS — R310 Gross hematuria: Secondary | ICD-10-CM | POA: Diagnosis not present

## 2015-05-16 DIAGNOSIS — I951 Orthostatic hypotension: Secondary | ICD-10-CM | POA: Diagnosis not present

## 2015-05-30 DIAGNOSIS — E039 Hypothyroidism, unspecified: Secondary | ICD-10-CM | POA: Diagnosis not present

## 2015-05-30 DIAGNOSIS — Z950 Presence of cardiac pacemaker: Secondary | ICD-10-CM | POA: Diagnosis not present

## 2015-05-30 DIAGNOSIS — I951 Orthostatic hypotension: Secondary | ICD-10-CM | POA: Diagnosis not present

## 2015-05-30 DIAGNOSIS — Z7901 Long term (current) use of anticoagulants: Secondary | ICD-10-CM | POA: Diagnosis not present

## 2015-05-30 DIAGNOSIS — E784 Other hyperlipidemia: Secondary | ICD-10-CM | POA: Diagnosis not present

## 2015-05-30 DIAGNOSIS — G4733 Obstructive sleep apnea (adult) (pediatric): Secondary | ICD-10-CM | POA: Diagnosis not present

## 2015-05-30 DIAGNOSIS — I480 Paroxysmal atrial fibrillation: Secondary | ICD-10-CM | POA: Diagnosis not present

## 2015-05-30 DIAGNOSIS — Z8673 Personal history of transient ischemic attack (TIA), and cerebral infarction without residual deficits: Secondary | ICD-10-CM | POA: Diagnosis not present

## 2015-06-10 DIAGNOSIS — Z7901 Long term (current) use of anticoagulants: Secondary | ICD-10-CM | POA: Diagnosis not present

## 2015-06-10 DIAGNOSIS — I480 Paroxysmal atrial fibrillation: Secondary | ICD-10-CM | POA: Diagnosis not present

## 2015-06-10 DIAGNOSIS — E039 Hypothyroidism, unspecified: Secondary | ICD-10-CM | POA: Diagnosis not present

## 2015-06-10 DIAGNOSIS — Z8673 Personal history of transient ischemic attack (TIA), and cerebral infarction without residual deficits: Secondary | ICD-10-CM | POA: Diagnosis not present

## 2015-06-10 DIAGNOSIS — I951 Orthostatic hypotension: Secondary | ICD-10-CM | POA: Diagnosis not present

## 2015-06-10 DIAGNOSIS — E784 Other hyperlipidemia: Secondary | ICD-10-CM | POA: Diagnosis not present

## 2015-06-10 DIAGNOSIS — Z950 Presence of cardiac pacemaker: Secondary | ICD-10-CM | POA: Diagnosis not present

## 2015-06-10 DIAGNOSIS — G4733 Obstructive sleep apnea (adult) (pediatric): Secondary | ICD-10-CM | POA: Diagnosis not present

## 2015-06-26 ENCOUNTER — Ambulatory Visit (INDEPENDENT_AMBULATORY_CARE_PROVIDER_SITE_OTHER): Payer: Medicare Other | Admitting: Internal Medicine

## 2015-06-26 ENCOUNTER — Encounter: Payer: Self-pay | Admitting: Internal Medicine

## 2015-06-26 VITALS — BP 102/58 | HR 72 | Temp 98.4°F | Resp 16 | Ht 68.0 in | Wt 167.0 lb

## 2015-06-26 DIAGNOSIS — G309 Alzheimer's disease, unspecified: Secondary | ICD-10-CM

## 2015-06-26 DIAGNOSIS — Z7901 Long term (current) use of anticoagulants: Secondary | ICD-10-CM | POA: Diagnosis not present

## 2015-06-26 DIAGNOSIS — E784 Other hyperlipidemia: Secondary | ICD-10-CM | POA: Diagnosis not present

## 2015-06-26 DIAGNOSIS — E785 Hyperlipidemia, unspecified: Secondary | ICD-10-CM | POA: Diagnosis not present

## 2015-06-26 DIAGNOSIS — E039 Hypothyroidism, unspecified: Secondary | ICD-10-CM | POA: Diagnosis not present

## 2015-06-26 DIAGNOSIS — E559 Vitamin D deficiency, unspecified: Secondary | ICD-10-CM

## 2015-06-26 DIAGNOSIS — Z0000 Encounter for general adult medical examination without abnormal findings: Secondary | ICD-10-CM | POA: Insufficient documentation

## 2015-06-26 DIAGNOSIS — R7309 Other abnormal glucose: Secondary | ICD-10-CM | POA: Diagnosis not present

## 2015-06-26 DIAGNOSIS — Z79899 Other long term (current) drug therapy: Secondary | ICD-10-CM | POA: Diagnosis not present

## 2015-06-26 DIAGNOSIS — Z950 Presence of cardiac pacemaker: Secondary | ICD-10-CM | POA: Diagnosis not present

## 2015-06-26 DIAGNOSIS — I959 Hypotension, unspecified: Secondary | ICD-10-CM

## 2015-06-26 DIAGNOSIS — F0280 Dementia in other diseases classified elsewhere without behavioral disturbance: Secondary | ICD-10-CM

## 2015-06-26 DIAGNOSIS — Z8673 Personal history of transient ischemic attack (TIA), and cerebral infarction without residual deficits: Secondary | ICD-10-CM | POA: Diagnosis not present

## 2015-06-26 DIAGNOSIS — I951 Orthostatic hypotension: Secondary | ICD-10-CM | POA: Diagnosis not present

## 2015-06-26 DIAGNOSIS — I480 Paroxysmal atrial fibrillation: Secondary | ICD-10-CM | POA: Diagnosis not present

## 2015-06-26 DIAGNOSIS — R7303 Prediabetes: Secondary | ICD-10-CM

## 2015-06-26 DIAGNOSIS — G4733 Obstructive sleep apnea (adult) (pediatric): Secondary | ICD-10-CM | POA: Diagnosis not present

## 2015-06-26 LAB — BASIC METABOLIC PANEL WITH GFR
BUN: 15 mg/dL (ref 7–25)
CALCIUM: 9 mg/dL (ref 8.6–10.3)
CO2: 23 mmol/L (ref 20–31)
Chloride: 108 mmol/L (ref 98–110)
Creat: 0.81 mg/dL (ref 0.70–1.11)
GFR, Est African American: >89 mL/min (ref >=60)
GFR, Est Non African American: 82 mL/min (ref >=60)
GLUCOSE: 72 mg/dL (ref 65–99)
POTASSIUM: 4.2 mmol/L (ref 3.5–5.3)
SODIUM: 141 mmol/L (ref 135–146)

## 2015-06-26 LAB — LIPID PANEL
CHOL/HDL RATIO: 3.4 ratio (ref <=5.0)
Cholesterol: 185 mg/dL (ref 125–200)
HDL: 55 mg/dL (ref >=40)
LDL CALC: 103 mg/dL (ref <130)
TRIGLYCERIDES: 137 mg/dL (ref <150)
VLDL: 27 mg/dL (ref <30)

## 2015-06-26 LAB — HEPATIC FUNCTION PANEL
ALBUMIN: 3.7 g/dL (ref 3.6–5.1)
ALK PHOS: 51 U/L (ref 40–115)
ALT: 11 U/L (ref 9–46)
AST: 15 U/L (ref 10–35)
Bilirubin, Direct: 0.1 mg/dL (ref <=0.2)
Indirect Bilirubin: 0.5 mg/dL (ref 0.2–1.2)
TOTAL PROTEIN: 6.2 g/dL (ref 6.1–8.1)
Total Bilirubin: 0.6 mg/dL (ref 0.2–1.2)

## 2015-06-26 LAB — CBC WITH DIFFERENTIAL/PLATELET
Basophils Absolute: 0.1 10*3/uL (ref 0.0–0.1)
Basophils Relative: 1 % (ref 0–1)
EOS PCT: 4 % (ref 0–5)
Eosinophils Absolute: 0.2 10*3/uL (ref 0.0–0.7)
HEMATOCRIT: 38.9 % — AB (ref 39.0–52.0)
Hemoglobin: 13.1 g/dL (ref 13.0–17.0)
LYMPHS PCT: 22 % (ref 12–46)
Lymphs Abs: 1.4 10*3/uL (ref 0.7–4.0)
MCH: 30.3 pg (ref 26.0–34.0)
MCHC: 33.7 g/dL (ref 30.0–36.0)
MCV: 90 fL (ref 78.0–100.0)
MPV: 10.3 fL (ref 8.6–12.4)
Monocytes Absolute: 0.6 10*3/uL (ref 0.1–1.0)
Monocytes Relative: 10 % (ref 3–12)
Neutro Abs: 3.9 10*3/uL (ref 1.7–7.7)
Neutrophils Relative %: 63 % (ref 43–77)
PLATELETS: 160 10*3/uL (ref 150–400)
RBC: 4.32 MIL/uL (ref 4.22–5.81)
RDW: 14.1 % (ref 11.5–15.5)
WBC: 6.2 10*3/uL (ref 4.0–10.5)

## 2015-06-26 LAB — MAGNESIUM: MAGNESIUM: 2 mg/dL (ref 1.5–2.5)

## 2015-06-26 LAB — TSH: TSH: 2.97 u[IU]/mL (ref 0.350–4.500)

## 2015-06-26 NOTE — Progress Notes (Signed)
Assessment and Plan:  Hypertension:  -Continue medication,  -monitor blood pressure at home.  -Continue DASH diet.   -Reminder to go to the ER if any CP, SOB, nausea, dizziness, severe HA, changes vision/speech, left arm numbness and tingling, and jaw pain.  Cholesterol: -Continue diet and exercise.  -Check cholesterol.   Pre-diabetes: -Continue diet and exercise.  -Check A1C  Vitamin D Def: -check level -continue medications.   Hypothroid -cont levothyroxine -Check TSH  Continue diet and meds as discussed. Further disposition pending results of labs.  HPI 81 y.o. male  presents for 3 month follow up with hypertension, hyperlipidemia, prediabetes and vitamin D.   His blood pressure has been controlled at home, today their BP is BP: (!) 102/58 mmHg.   He does not workout. He denies chest pain, shortness of breath, dizziness.   He is not on cholesterol medication and denies myalgias. His cholesterol is not at goal. The cholesterol last visit was:   Lab Results  Component Value Date   CHOL 211* 03/25/2015   HDL 55 03/25/2015   LDLCALC 111* 03/25/2015   TRIG 226* 03/25/2015   CHOLHDL 3.8 03/25/2015     He has not been working on diet and exercise for prediabetes, and denies foot ulcerations, hyperglycemia, hypoglycemia , increased appetite, nausea, paresthesia of the feet, polydipsia, polyuria, visual disturbances, vomiting and weight loss. Last A1C in the office was:  Lab Results  Component Value Date   HGBA1C 6.0* 03/25/2015    Patient is on Vitamin D supplement.  Lab Results  Component Value Date   VD25OH 46 03/25/2015      Current Medications:  Current Outpatient Prescriptions on File Prior to Visit  Medication Sig Dispense Refill  . cholecalciferol (VITAMIN D) 1000 UNITS tablet Take 2,000 Units by mouth daily.     . hydrocortisone 2.5 % cream Apply 1 application topically daily. To face    . ketoconazole (NIZORAL) 2 % cream Apply 1 application topically 2  (two) times daily as needed for irritation. 30 g 2  . levothyroxine (SYNTHROID, LEVOTHROID) 50 MCG tablet Take 1 tablet (50 mcg total) by mouth daily before breakfast. 90 tablet 1  . NAMENDA XR 28 MG CP24 24 hr capsule     . tamsulosin (FLOMAX) 0.4 MG CAPS capsule Take 1 capsule (0.4 mg total) by mouth daily. 90 capsule 3  . warfarin (COUMADIN) 6 MG tablet Take 3-9 mg by mouth daily. Take 1.5 tablets every Monday, Wednesday, and Friday. Take 0.5 tablet every Tuesday, Thursday, Saturday and Sunday.     No current facility-administered medications on file prior to visit.    Medical History:  Past Medical History  Diagnosis Date  . Stroke     twice  . Hyperlipidemia   . Hypertension   . OSA (obstructive sleep apnea)   . RLS (restless legs syndrome)   . Osteopenia   . Thyroid disease   . Vitamin D deficiency   . Pacemaker medtronic   . DJD (degenerative joint disease)   . Atherosclerosis of aorta 2010    noted on lumbar CT in 2010  . COPD (chronic obstructive pulmonary disease)     On CXR  . Sinoatrial node dysfunction   . Prediabetes   . Atrial fibrillation     Allergies:  Allergies  Allergen Reactions  . Bee Venom      Review of Systems:  Review of Systems  Constitutional: Negative for fever, chills and malaise/fatigue.  HENT: Negative for congestion, ear pain, nosebleeds  and sore throat.   Eyes: Negative.   Respiratory: Positive for cough. Negative for shortness of breath and wheezing.   Cardiovascular: Negative for chest pain, palpitations and leg swelling.  Gastrointestinal: Negative for heartburn, diarrhea, constipation, blood in stool and melena.  Genitourinary: Negative.   Skin: Negative.   Neurological: Negative for dizziness, loss of consciousness and headaches.  Psychiatric/Behavioral: Negative for depression. The patient is not nervous/anxious and does not have insomnia.     Family history- Review and unchanged  Social history- Review and  unchanged  Physical Exam: BP 102/58 mmHg  Pulse 72  Temp(Src) 98.4 F (36.9 C) (Temporal)  Resp 16  Ht 5\' 8"  (1.727 m)  Wt 167 lb (75.751 kg)  BMI 25.40 kg/m2 Wt Readings from Last 3 Encounters:  06/26/15 167 lb (75.751 kg)  05/14/15 172 lb 6.4 oz (78.2 kg)  03/25/15 173 lb 12.8 oz (78.835 kg)    General Appearance: Well nourished well developed, in no apparent distress. Eyes: PERRLA, EOMs, conjunctiva no swelling or erythema ENT/Mouth: Ear canals normal without obstruction, swelling, erythma, discharge.  TMs normal bilaterally.  Oropharynx moist, clear, without exudate, or postoropharyngeal swelling. Neck: Supple, thyroid normal,no cervical adenopathy  Respiratory: Respiratory effort normal, Breath sounds clear A&P without rhonchi, wheeze, or rale.  No retractions, no accessory usage. Cardio: RRR with no MRGs. Brisk peripheral pulses without edema.  Abdomen: Soft, + BS,  Non tender, no guarding, rebound, hernias, masses. Musculoskeletal: Full ROM, 5/5 strength, Normal gait Skin: Warm, dry without rashes, lesions, ecchymosis., Seborrheic dermatitis around the eye brows and hairline.  Skin flaking with no redness or iritation.  Neuro: Awake and oriented X 3, Cranial nerves intact. Normal muscle tone, no cerebellar symptoms. Psych: Normal affect, Insight and Judgment appropriate.    Starlyn Skeans, PA-C 9:58 AM [facility]

## 2015-06-27 LAB — HEMOGLOBIN A1C
HEMOGLOBIN A1C: 6.3 % — AB (ref <5.7)
Mean Plasma Glucose: 134 mg/dL — ABNORMAL HIGH (ref <117)

## 2015-06-27 LAB — VITAMIN D 25 HYDROXY (VIT D DEFICIENCY, FRACTURES): VIT D 25 HYDROXY: 37 ng/mL (ref 30–100)

## 2015-06-27 LAB — INSULIN, RANDOM: Insulin: 4.2 u[IU]/mL (ref 2.0–19.6)

## 2015-07-03 DIAGNOSIS — Z8673 Personal history of transient ischemic attack (TIA), and cerebral infarction without residual deficits: Secondary | ICD-10-CM | POA: Diagnosis not present

## 2015-07-03 DIAGNOSIS — G4733 Obstructive sleep apnea (adult) (pediatric): Secondary | ICD-10-CM | POA: Diagnosis not present

## 2015-07-03 DIAGNOSIS — I951 Orthostatic hypotension: Secondary | ICD-10-CM | POA: Diagnosis not present

## 2015-07-03 DIAGNOSIS — Z950 Presence of cardiac pacemaker: Secondary | ICD-10-CM | POA: Diagnosis not present

## 2015-07-03 DIAGNOSIS — E784 Other hyperlipidemia: Secondary | ICD-10-CM | POA: Diagnosis not present

## 2015-07-03 DIAGNOSIS — Z7901 Long term (current) use of anticoagulants: Secondary | ICD-10-CM | POA: Diagnosis not present

## 2015-07-03 DIAGNOSIS — E039 Hypothyroidism, unspecified: Secondary | ICD-10-CM | POA: Diagnosis not present

## 2015-07-03 DIAGNOSIS — I480 Paroxysmal atrial fibrillation: Secondary | ICD-10-CM | POA: Diagnosis not present

## 2015-07-17 DIAGNOSIS — I480 Paroxysmal atrial fibrillation: Secondary | ICD-10-CM | POA: Diagnosis not present

## 2015-07-17 DIAGNOSIS — E784 Other hyperlipidemia: Secondary | ICD-10-CM | POA: Diagnosis not present

## 2015-07-17 DIAGNOSIS — G4733 Obstructive sleep apnea (adult) (pediatric): Secondary | ICD-10-CM | POA: Diagnosis not present

## 2015-07-17 DIAGNOSIS — Z8673 Personal history of transient ischemic attack (TIA), and cerebral infarction without residual deficits: Secondary | ICD-10-CM | POA: Diagnosis not present

## 2015-07-17 DIAGNOSIS — Z7901 Long term (current) use of anticoagulants: Secondary | ICD-10-CM | POA: Diagnosis not present

## 2015-07-17 DIAGNOSIS — E039 Hypothyroidism, unspecified: Secondary | ICD-10-CM | POA: Diagnosis not present

## 2015-07-17 DIAGNOSIS — I951 Orthostatic hypotension: Secondary | ICD-10-CM | POA: Diagnosis not present

## 2015-07-17 DIAGNOSIS — Z950 Presence of cardiac pacemaker: Secondary | ICD-10-CM | POA: Diagnosis not present

## 2015-08-01 DIAGNOSIS — Z7901 Long term (current) use of anticoagulants: Secondary | ICD-10-CM | POA: Diagnosis not present

## 2015-08-01 DIAGNOSIS — Z8673 Personal history of transient ischemic attack (TIA), and cerebral infarction without residual deficits: Secondary | ICD-10-CM | POA: Diagnosis not present

## 2015-08-01 DIAGNOSIS — E784 Other hyperlipidemia: Secondary | ICD-10-CM | POA: Diagnosis not present

## 2015-08-01 DIAGNOSIS — Z950 Presence of cardiac pacemaker: Secondary | ICD-10-CM | POA: Diagnosis not present

## 2015-08-01 DIAGNOSIS — I951 Orthostatic hypotension: Secondary | ICD-10-CM | POA: Diagnosis not present

## 2015-08-01 DIAGNOSIS — I480 Paroxysmal atrial fibrillation: Secondary | ICD-10-CM | POA: Diagnosis not present

## 2015-08-01 DIAGNOSIS — E039 Hypothyroidism, unspecified: Secondary | ICD-10-CM | POA: Diagnosis not present

## 2015-08-01 DIAGNOSIS — G4733 Obstructive sleep apnea (adult) (pediatric): Secondary | ICD-10-CM | POA: Diagnosis not present

## 2015-08-20 DIAGNOSIS — N138 Other obstructive and reflux uropathy: Secondary | ICD-10-CM | POA: Diagnosis not present

## 2015-08-20 DIAGNOSIS — R339 Retention of urine, unspecified: Secondary | ICD-10-CM | POA: Diagnosis not present

## 2015-08-20 DIAGNOSIS — N401 Enlarged prostate with lower urinary tract symptoms: Secondary | ICD-10-CM | POA: Diagnosis not present

## 2015-08-29 DIAGNOSIS — I951 Orthostatic hypotension: Secondary | ICD-10-CM | POA: Diagnosis not present

## 2015-08-29 DIAGNOSIS — I480 Paroxysmal atrial fibrillation: Secondary | ICD-10-CM | POA: Diagnosis not present

## 2015-08-29 DIAGNOSIS — Z7901 Long term (current) use of anticoagulants: Secondary | ICD-10-CM | POA: Diagnosis not present

## 2015-08-29 DIAGNOSIS — E784 Other hyperlipidemia: Secondary | ICD-10-CM | POA: Diagnosis not present

## 2015-08-29 DIAGNOSIS — Z950 Presence of cardiac pacemaker: Secondary | ICD-10-CM | POA: Diagnosis not present

## 2015-08-29 DIAGNOSIS — G4733 Obstructive sleep apnea (adult) (pediatric): Secondary | ICD-10-CM | POA: Diagnosis not present

## 2015-08-29 DIAGNOSIS — Z8673 Personal history of transient ischemic attack (TIA), and cerebral infarction without residual deficits: Secondary | ICD-10-CM | POA: Diagnosis not present

## 2015-08-29 DIAGNOSIS — E039 Hypothyroidism, unspecified: Secondary | ICD-10-CM | POA: Diagnosis not present

## 2015-09-09 ENCOUNTER — Ambulatory Visit (INDEPENDENT_AMBULATORY_CARE_PROVIDER_SITE_OTHER): Payer: Medicare Other | Admitting: Nurse Practitioner

## 2015-09-09 ENCOUNTER — Ambulatory Visit (INDEPENDENT_AMBULATORY_CARE_PROVIDER_SITE_OTHER): Payer: Medicare Other | Admitting: *Deleted

## 2015-09-09 ENCOUNTER — Encounter: Payer: Self-pay | Admitting: Nurse Practitioner

## 2015-09-09 VITALS — BP 124/68 | HR 76 | Ht 68.5 in | Wt 168.6 lb

## 2015-09-09 DIAGNOSIS — I2510 Atherosclerotic heart disease of native coronary artery without angina pectoris: Secondary | ICD-10-CM

## 2015-09-09 DIAGNOSIS — F0280 Dementia in other diseases classified elsewhere without behavioral disturbance: Secondary | ICD-10-CM | POA: Diagnosis not present

## 2015-09-09 DIAGNOSIS — I480 Paroxysmal atrial fibrillation: Secondary | ICD-10-CM

## 2015-09-09 DIAGNOSIS — I6789 Other cerebrovascular disease: Secondary | ICD-10-CM | POA: Diagnosis not present

## 2015-09-09 DIAGNOSIS — G300 Alzheimer's disease with early onset: Secondary | ICD-10-CM | POA: Diagnosis not present

## 2015-09-09 DIAGNOSIS — Z23 Encounter for immunization: Secondary | ICD-10-CM | POA: Diagnosis not present

## 2015-09-09 DIAGNOSIS — I10 Essential (primary) hypertension: Secondary | ICD-10-CM

## 2015-09-09 MED ORDER — NAMENDA XR 28 MG PO CP24: 28.0000 mg | ORAL_CAPSULE | Freq: Every day | ORAL | Status: DC

## 2015-09-09 NOTE — Progress Notes (Signed)
I have reviewed and agreed above plan. 

## 2015-09-09 NOTE — Progress Notes (Signed)
[facility]  PATIENT: MATTHEUS RAULS DOB: 04/18/1930   REASON FOR VISIT: follow-up for Alzheimer's dementia and cerebrovascular disease/stroke hyperlipidemia,  HISTORY FROM:patient and wife    HISTORY OF PRESENT ILLNESS:Mr Boschert, 60 year Caucasian male with long-standing history of mild dementia as well as remote right MCA branch infarct secondary to cardiac embolism from atrial fibrillation in June 2006 who is seen today for followup after last visit on 03/08/15. The patient is accompanied today by his wife they state his memory is stable . He  still remains quite independent in activities of daily living. He is quite active planting a garden and mowes his yard with his tractor. He has no issues with agitation, delusions, hallucinations. He does go out independently driving and has never gotten lost. He has no issues with gait and has good balance and no falls.  He has been on Namenda 10 mg twice daily for several years recently switched to extended release.He is on Coumadin for secondary stroke prevention. Minimal bruising reported.  He has no new complaints today. His memory score is stable. Reviewed recent labs   REVIEW OF SYSTEMS: Full 14 system review of systems performed and notable only for those listed, all others are neg:  Constitutional: neg  Cardiovascular: history of atrial fibrillation Ear/Nose/Throat: neg  Skin: neg Eyes: neg Respiratory: neg Gastroitestinal: neg  Hematology/Lymphatic: neg  Endocrine: neg Musculoskeletal:neg Allergy/Immunology: neg Neurological: memory loss, history of stroke Psychiatric: neg Sleep : neg   ALLERGIES: Allergies  Allergen Reactions  . Bee Venom     HOME MEDICATIONS: Outpatient Prescriptions Prior to Visit  Medication Sig Dispense Refill  . cholecalciferol (VITAMIN D) 1000 UNITS tablet Take 2,000 Units by mouth daily.     . hydrocortisone 2.5 % cream Apply 1 application topically daily. To face    .  ketoconazole (NIZORAL) 2 % cream Apply 1 application topically 2 (two) times daily as needed for irritation. 30 g 2  . levothyroxine (SYNTHROID, LEVOTHROID) 50 MCG tablet Take 1 tablet (50 mcg total) by mouth daily before breakfast. 90 tablet 1  . NAMENDA XR 28 MG CP24 24 hr capsule Take 28 mg by mouth daily.     . tamsulosin (FLOMAX) 0.4 MG CAPS capsule Take 1 capsule (0.4 mg total) by mouth daily. 90 capsule 3  . warfarin (COUMADIN) 6 MG tablet Take 3-9 mg by mouth daily. Take 1.5 tablets every Monday, Wednesday, and Friday. Take 0.5 tablet every Tuesday, Thursday, Saturday and Sunday.     No facility-administered medications prior to visit.    PAST MEDICAL HISTORY: Past Medical History  Diagnosis Date  . Stroke [facility])     twice  . Hyperlipidemia   . Hypertension   . OSA (obstructive sleep apnea)   . RLS (restless legs syndrome)   . Osteopenia   . Thyroid disease   . Vitamin D deficiency   . Pacemaker medtronic   . DJD (degenerative joint disease)   . Atherosclerosis of aorta ([place]) 2010    noted on lumbar CT in 2010  . COPD (chronic obstructive pulmonary disease) ([place])     On CXR  . Sinoatrial node dysfunction ([facility])   . Prediabetes   . Atrial fibrillation ([place])     PAST SURGICAL HISTORY: Past Surgical History  Procedure Laterality Date  . Pacemaker insertion    . Pacemaker generator change N/A 05/04/2014    Procedure: PACEMAKER GENERATOR CHANGE;  Surgeon: [name], MD;  [facility];  Service: Cardiovascular;  Laterality: N/A;    FAMILY HISTORY: Family History  Problem Relation Age of Onset  . Heart attack Father     SOCIAL HISTORY: Social History   Social History  . Marital Status: Married    Spouse Name: N/A  . Number of Children: 5  . Years of Education: 11   Occupational History  . Retired    Social History Main Topics  . Smoking status: Never Smoker   . Smokeless tobacco: Never Used  . Alcohol Use: No  . Drug Use: No  . Sexual  Activity: Not on file   Other Topics Concern  . Not on file   Social History Narrative   Patient lives at home with his wife    Patient drinks coffee daily     PHYSICAL EXAM  Filed Vitals:   09/09/15 0957  BP: 124/68  Pulse: 76  Height: 5' 8.5" (1.74 m)  Weight: 168 lb 9.6 oz (76.476 kg)   Body mass index is 25.26 kg/(m^2). General: well developed, well nourished elderly Caucasian male, seated, in no evident distress  Head: head normocephalic and atraumatic. Orohparynx benign  Neck: supple with no carotid  bruits  Musculoskeletal: no deformity  Skin: no rash/petichiae  Vascular: Normal pulses all extremities  Neurologic Exam  Mental Status: Awake and fully alert. Oriented to place and time. Recent and remote memory diminished. Mini-Mental status exam score 27/30 missing 1 of 3 recall..  Animal naming test 12. Clock drawing 4/4. Attention span, concentration and fund of knowledge appropriate. Mood and affect appropriate.  Cranial Nerves: Fundoscopic exam deferred.Pupils equal, briskly reactive to light. Extraocular movements full without nystagmus. Visual fields full to confrontation. Hard of hearing. Facial sensation intact. Face, tongue, palate moves normally and symmetrically.  Motor: Normal bulk and tone. Normal strength in all tested extremity muscles.  Sensory.: intact to touch in the face arms and legs.  Coordination: Rapid alternating movements normal in all extremities. Finger-to-nose and heel-to-shin performed accurately bilaterally.  Gait and Station: Arises from chair without difficulty. Stance is stooped. Gait demonstrates short stride length able to heel, toe and tandem walk without difficulty. No assistive device Reflexes: 1+ and symmetric. Toes downgoing.  DIAGNOSTIC DATA (LABS, IMAGING, TESTING) - I reviewed patient records, labs, notes, testing and imaging myself where available.  Lab Results  Component Value Date   WBC 6.2 06/26/2015   HGB  13.1 06/26/2015   HCT 38.9* 06/26/2015   MCV 90.0 06/26/2015   PLT 160 06/26/2015      Component Value Date/Time   NA 141 06/26/2015 1018   K 4.2 06/26/2015 1018   CL 108 06/26/2015 1018   CO2 23 06/26/2015 1018   GLUCOSE 72 06/26/2015 1018   BUN 15 06/26/2015 1018   CREATININE 0.81 06/26/2015 1018   CREATININE 1.1 05/01/2014 1037   CALCIUM 9.0 06/26/2015 1018   PROT 6.2 06/26/2015 1018   ALBUMIN 3.7 06/26/2015 1018   AST 15 06/26/2015 1018   ALT 11 06/26/2015 1018   ALKPHOS 51 06/26/2015 1018   BILITOT 0.6 06/26/2015 1018   GFRNONAA 82 06/26/2015 1018   GFRNONAA >60 02/21/2008 1206   GFRAA >89 06/26/2015 1018   GFRAA  02/21/2008 1206    >60        The eGFR has been calculated using the MDRD equation. This calculation has not been validated in all clinical   Lab Results  Component Value Date   CHOL 185 06/26/2015   HDL 55 06/26/2015   LDLCALC 103 06/26/2015  TRIG 137 06/26/2015   CHOLHDL 3.4 06/26/2015   Lab Results  Component Value Date   HGBA1C 6.3* 06/26/2015   No results found for: [id number] Lab Results  Component Value Date   TSH 2.970 06/26/2015      ASSESSMENT AND PLAN  81 y.o. year old male  has a past medical history of Stroke ([place]); Hyperlipidemia; Hypertension; OSA (obstructive sleep apnea); RLS (restless legs syndrome); Vitamin D deficiency; Pacemaker medtronic; DJD (degenerative joint disease); Atherosclerosis of aorta ([place]) (2010); COPD (chronic obstructive pulmonary disease) ([place]); Sinoatrial node dysfunction ([place]); Prediabetes; and Atrial fibrillation ([place]). here to follow-up.The patient is a current patient of Dr. Leonie Man  who is out of the office today . This note is sent to the work in doctor.     Memory score is stable Continue Namenda ER will  Refill Reviewed the importance of managing stroke risk factors, of hyperlipedemia,and atrial fib . Continue Coumadin for secondary stroke prevention and atrial fibrillation Keep systolic blood  pressure less than 130, today's reading 124/68 Reviewed recent lipid panel, strive to keep cholesterol less than 200 most recent 185, LDL less than 100 most recent 103 Moderate exercise for overall health and well-being at least 5 times a week Follow-up in 6 months next with Dr. Sloan Leiter, [facility], [facility], [facility] [address] [place], [place] [address] [phone]

## 2015-09-09 NOTE — Patient Instructions (Addendum)
Memory score is stable Continue Namenda ER will  refill Continue Coumadin for secondary stroke prevention and atrial fibrillation Keep systolic blood pressure less than 130, today's reading 124/68 Reviewed recent lipid panel, strive to keep cholesterol less than 200 most recent 185, LDL less than 100 most recent 103 Moderate exercise for overall health and well-being at least 5 times a week Follow-up in 6 months next with Dr. Leonie Man

## 2015-09-10 DIAGNOSIS — Z8673 Personal history of transient ischemic attack (TIA), and cerebral infarction without residual deficits: Secondary | ICD-10-CM | POA: Diagnosis not present

## 2015-09-10 DIAGNOSIS — G4733 Obstructive sleep apnea (adult) (pediatric): Secondary | ICD-10-CM | POA: Diagnosis not present

## 2015-09-10 DIAGNOSIS — E784 Other hyperlipidemia: Secondary | ICD-10-CM | POA: Diagnosis not present

## 2015-09-10 DIAGNOSIS — Z950 Presence of cardiac pacemaker: Secondary | ICD-10-CM | POA: Diagnosis not present

## 2015-09-10 DIAGNOSIS — E039 Hypothyroidism, unspecified: Secondary | ICD-10-CM | POA: Diagnosis not present

## 2015-09-10 DIAGNOSIS — Z7901 Long term (current) use of anticoagulants: Secondary | ICD-10-CM | POA: Diagnosis not present

## 2015-09-10 DIAGNOSIS — I951 Orthostatic hypotension: Secondary | ICD-10-CM | POA: Diagnosis not present

## 2015-09-10 DIAGNOSIS — I480 Paroxysmal atrial fibrillation: Secondary | ICD-10-CM | POA: Diagnosis not present

## 2015-09-12 DIAGNOSIS — L812 Freckles: Secondary | ICD-10-CM | POA: Diagnosis not present

## 2015-09-12 DIAGNOSIS — L821 Other seborrheic keratosis: Secondary | ICD-10-CM | POA: Diagnosis not present

## 2015-09-12 DIAGNOSIS — L814 Other melanin hyperpigmentation: Secondary | ICD-10-CM | POA: Diagnosis not present

## 2015-09-12 DIAGNOSIS — D225 Melanocytic nevi of trunk: Secondary | ICD-10-CM | POA: Diagnosis not present

## 2015-09-12 DIAGNOSIS — L570 Actinic keratosis: Secondary | ICD-10-CM | POA: Diagnosis not present

## 2015-09-26 ENCOUNTER — Encounter: Payer: Self-pay | Admitting: Physician Assistant

## 2015-09-26 ENCOUNTER — Ambulatory Visit (INDEPENDENT_AMBULATORY_CARE_PROVIDER_SITE_OTHER): Payer: Medicare Other | Admitting: Physician Assistant

## 2015-09-26 VITALS — BP 100/70 | HR 81 | Temp 97.5°F | Resp 14 | Ht 68.0 in | Wt 170.0 lb

## 2015-09-26 DIAGNOSIS — I10 Essential (primary) hypertension: Secondary | ICD-10-CM | POA: Diagnosis not present

## 2015-09-26 DIAGNOSIS — Z9989 Dependence on other enabling machines and devices: Secondary | ICD-10-CM

## 2015-09-26 DIAGNOSIS — G300 Alzheimer's disease with early onset: Secondary | ICD-10-CM

## 2015-09-26 DIAGNOSIS — I480 Paroxysmal atrial fibrillation: Secondary | ICD-10-CM

## 2015-09-26 DIAGNOSIS — F0280 Dementia in other diseases classified elsewhere without behavioral disturbance: Secondary | ICD-10-CM

## 2015-09-26 DIAGNOSIS — Z0001 Encounter for general adult medical examination with abnormal findings: Secondary | ICD-10-CM

## 2015-09-26 DIAGNOSIS — Z0000 Encounter for general adult medical examination without abnormal findings: Secondary | ICD-10-CM

## 2015-09-26 DIAGNOSIS — I2510 Atherosclerotic heart disease of native coronary artery without angina pectoris: Secondary | ICD-10-CM

## 2015-09-26 DIAGNOSIS — E785 Hyperlipidemia, unspecified: Secondary | ICD-10-CM | POA: Diagnosis not present

## 2015-09-26 DIAGNOSIS — E559 Vitamin D deficiency, unspecified: Secondary | ICD-10-CM

## 2015-09-26 DIAGNOSIS — G4733 Obstructive sleep apnea (adult) (pediatric): Secondary | ICD-10-CM | POA: Diagnosis not present

## 2015-09-26 DIAGNOSIS — I495 Sick sinus syndrome: Secondary | ICD-10-CM | POA: Diagnosis not present

## 2015-09-26 DIAGNOSIS — I6789 Other cerebrovascular disease: Secondary | ICD-10-CM | POA: Diagnosis not present

## 2015-09-26 DIAGNOSIS — R7309 Other abnormal glucose: Secondary | ICD-10-CM

## 2015-09-26 DIAGNOSIS — R350 Frequency of micturition: Secondary | ICD-10-CM

## 2015-09-26 DIAGNOSIS — J449 Chronic obstructive pulmonary disease, unspecified: Secondary | ICD-10-CM

## 2015-09-26 DIAGNOSIS — Z79899 Other long term (current) drug therapy: Secondary | ICD-10-CM

## 2015-09-26 DIAGNOSIS — R6889 Other general symptoms and signs: Secondary | ICD-10-CM | POA: Diagnosis not present

## 2015-09-26 LAB — BASIC METABOLIC PANEL WITH GFR
BUN: 13 mg/dL (ref 7–25)
CHLORIDE: 106 mmol/L (ref 98–110)
CO2: 25 mmol/L (ref 20–31)
Calcium: 8.9 mg/dL (ref 8.6–10.3)
Creat: 0.86 mg/dL (ref 0.70–1.11)
GFR, Est African American: >89 mL/min (ref >=60)
GFR, Est Non African American: 80 mL/min (ref >=60)
Glucose, Bld: 83 mg/dL (ref 65–99)
POTASSIUM: 4.3 mmol/L (ref 3.5–5.3)
SODIUM: 138 mmol/L (ref 135–146)

## 2015-09-26 LAB — HEPATIC FUNCTION PANEL
ALT: 10 U/L (ref 9–46)
AST: 15 U/L (ref 10–35)
Albumin: 3.9 g/dL (ref 3.6–5.1)
Alkaline Phosphatase: 51 U/L (ref 40–115)
BILIRUBIN DIRECT: 0.1 mg/dL (ref <=0.2)
Indirect Bilirubin: 0.4 mg/dL (ref 0.2–1.2)
Total Bilirubin: 0.5 mg/dL (ref 0.2–1.2)
Total Protein: 6.2 g/dL (ref 6.1–8.1)

## 2015-09-26 LAB — CBC WITH DIFFERENTIAL/PLATELET
BASOS PCT: 0 % (ref 0–1)
Basophils Absolute: 0 10*3/uL (ref 0.0–0.1)
Eosinophils Absolute: 0.2 10*3/uL (ref 0.0–0.7)
Eosinophils Relative: 4 % (ref 0–5)
HCT: 39.8 % (ref 39.0–52.0)
HEMOGLOBIN: 13.4 g/dL (ref 13.0–17.0)
LYMPHS ABS: 1.4 10*3/uL (ref 0.7–4.0)
Lymphocytes Relative: 27 % (ref 12–46)
MCH: 30.5 pg (ref 26.0–34.0)
MCHC: 33.7 g/dL (ref 30.0–36.0)
MCV: 90.5 fL (ref 78.0–100.0)
MPV: 10.1 fL (ref 8.6–12.4)
Monocytes Absolute: 0.5 10*3/uL (ref 0.1–1.0)
Monocytes Relative: 9 % (ref 3–12)
NEUTROS ABS: 3.1 10*3/uL (ref 1.7–7.7)
NEUTROS PCT: 60 % (ref 43–77)
Platelets: 170 10*3/uL (ref 150–400)
RBC: 4.4 MIL/uL (ref 4.22–5.81)
RDW: 13.8 % (ref 11.5–15.5)
WBC: 5.2 10*3/uL (ref 4.0–10.5)

## 2015-09-26 LAB — LIPID PANEL
CHOL/HDL RATIO: 3.2 ratio (ref <=5.0)
Cholesterol: 194 mg/dL (ref 125–200)
HDL: 61 mg/dL (ref >=40)
LDL Cholesterol: 97 mg/dL (ref <130)
Triglycerides: 181 mg/dL — ABNORMAL HIGH (ref <150)
VLDL: 36 mg/dL — AB (ref <30)

## 2015-09-26 LAB — TSH: TSH: 3.173 u[IU]/mL (ref 0.350–4.500)

## 2015-09-26 LAB — MAGNESIUM: Magnesium: 2 mg/dL (ref 1.5–2.5)

## 2015-09-26 LAB — HEMOGLOBIN A1C
HEMOGLOBIN A1C: 5.7 % — AB (ref <5.7)
MEAN PLASMA GLUCOSE: 117 mg/dL — AB (ref <117)

## 2015-09-26 NOTE — Patient Instructions (Signed)
Preventive Care for Adults A healthy lifestyle and preventive care can promote health and wellness. Preventive health guidelines for men include the following key practices:  A routine yearly physical is a good way to check with your health care provider about your health and preventative screening. It is a chance to share any concerns and updates on your health and to receive a thorough exam.  Visit your dentist for a routine exam and preventative care every 6 months. Brush your teeth twice a day and floss once a day. Good oral hygiene prevents tooth decay and gum disease.  The frequency of eye exams is based on your age, health, family medical history, use of contact lenses, and other factors. Follow your health care provider's recommendations for frequency of eye exams.  Eat a healthy diet. Foods such as vegetables, fruits, whole grains, low-fat dairy products, and lean protein foods contain the nutrients you need without too many calories. Decrease your intake of foods high in solid fats, added sugars, and salt. Eat the right amount of calories for you.Get information about a proper diet from your health care provider, if necessary.  Regular physical exercise is one of the most important things you can do for your health. Most adults should get at least 150 minutes of moderate-intensity exercise (any activity that increases your heart rate and causes you to sweat) each week. In addition, most adults need muscle-strengthening exercises on 2 or more days a week.  Maintain a healthy weight. The body mass index (BMI) is a screening tool to identify possible weight problems. It provides an estimate of body fat based on height and weight. Your health care provider can find your BMI and can help you achieve or maintain a healthy weight.For adults 20 years and older:  A BMI below 18.5 is considered underweight.  A BMI of 18.5 to 24.9 is normal.  A BMI of 25 to 29.9 is considered overweight.  A BMI  of 30 and above is considered obese.  Maintain normal blood lipids and cholesterol levels by exercising and minimizing your intake of saturated fat. Eat a balanced diet with plenty of fruit and vegetables. Blood tests for lipids and cholesterol should begin at age 20 and be repeated every 5 years. If your lipid or cholesterol levels are high, you are over 50, or you are at high risk for heart disease, you may need your cholesterol levels checked more frequently.Ongoing high lipid and cholesterol levels should be treated with medicines if diet and exercise are not working.  If you smoke, find out from your health care provider how to quit. If you do not use tobacco, do not start.  Lung cancer screening is recommended for adults aged 55-80 years who are at high risk for developing lung cancer because of a history of smoking. A yearly low-dose CT scan of the lungs is recommended for people who have at least a 30-pack-year history of smoking and are a current smoker or have quit within the past 15 years. A pack year of smoking is smoking an average of 1 pack of cigarettes a day for 1 year (for example: 1 pack a day for 30 years or 2 packs a day for 15 years). Yearly screening should continue until the smoker has stopped smoking for at least 15 years. Yearly screening should be stopped for people who develop a health problem that would prevent them from having lung cancer treatment.  If you choose to drink alcohol, do not have more than   2 drinks per day. One drink is considered to be 12 ounces (355 mL) of beer, 5 ounces (148 mL) of wine, or 1.5 ounces (44 mL) of liquor.  Avoid use of street drugs. Do not share needles with anyone. Ask for help if you need support or instructions about stopping the use of drugs.  High blood pressure causes heart disease and increases the risk of stroke. Your blood pressure should be checked at least every 1-2 years. Ongoing high blood pressure should be treated with  medicines, if weight loss and exercise are not effective.  If you are 45-79 years old, ask your health care provider if you should take aspirin to prevent heart disease.  Diabetes screening involves taking a blood sample to check your fasting blood sugar level. Testing should be considered at a younger age or be carried out more frequently if you are overweight and have at least 1 risk factor for diabetes.  Colorectal cancer can be detected and often prevented. Most routine colorectal cancer screening begins at the age of 50 and continues through age 75. However, your health care provider may recommend screening at an earlier age if you have risk factors for colon cancer. On a yearly basis, your health care provider may provide home test kits to check for hidden blood in the stool. Use of a small camera at the end of a tube to directly examine the colon (sigmoidoscopy or colonoscopy) can detect the earliest forms of colorectal cancer. Talk to your health care provider about this at age 50, when routine screening begins. Direct exam of the colon should be repeated every 5-10 years through age 75, unless early forms of precancerous polyps or small growths are found.  Hepatitis C blood testing is recommended for all people born from 1945 through 1965 and any individual with known risks for hepatitis C.  New guidelines recommend a once time screening for HIV.   Screening for abdominal aortic aneurysm (AAA)  by ultrasound is recommended for people who have history of high blood pressure or who are current or former smokers.  Healthy men should  receive prostate-specific antigen (PSA) blood tests as part of routine cancer screening. Talk with your health care provider about prostate cancer screening.  Testicular cancer screening is  recommended for adult males. Screening includes self-exam, a health care provider exam, and other screening tests. Consult with your health care provider about any symptoms you  have or any concerns you have about testicular cancer.  Use sunscreen. Apply sunscreen liberally and repeatedly throughout the day. You should seek shade when your shadow is shorter than you. Protect yourself by wearing long sleeves, pants, a wide-brimmed hat, and sunglasses year round, whenever you are outdoors.  Once a month, do a whole-body skin exam, using a mirror to look at the skin on your back. Tell your health care provider about new moles, moles that have irregular borders, moles that are larger than a pencil eraser, or moles that have changed in shape or color.  Stay current with required vaccines (immunizations).  Influenza vaccine. All adults should be immunized every year.  Tetanus, diphtheria, and acellular pertussis (Td, Tdap) vaccine. An adult who has not previously received Tdap or who does not know his vaccine status should receive 1 dose of Tdap. This initial dose should be followed by tetanus and diphtheria toxoids (Td) booster doses every 10 years. Adults with an unknown or incomplete history of completing a 3-dose immunization series with Td-containing vaccines should begin or   complete a primary immunization series including a Tdap dose. Adults should receive a Td booster every 10 years.  Zoster vaccine. One dose is recommended for adults aged 60 years or older unless certain conditions are present.    PREVNAR - Pneumococcal 13-valent conjugate (PCV13) vaccine. When indicated, a person who is uncertain of his immunization history and has no record of immunization should receive the PCV13 vaccine. An adult aged 19 years or older who has certain medical conditions and has not been previously immunized should receive 1 dose of PCV13 vaccine. This PCV13 should be followed with a dose of pneumococcal polysaccharide (PPSV23) vaccine. The PPSV23 vaccine dose should be obtained at least 8 weeks after the dose of PCV13 vaccine. An adult aged 19 years or older who has certain medical  conditions and previously received 1 or more doses of PPSV23 vaccine should receive 1 dose of PCV13. The PCV13 vaccine dose should be obtained 1 or more years after the last PPSV23 vaccine dose.    PNEUMOVAX - Pneumococcal polysaccharide (PPSV23) vaccine. When PCV13 is also indicated, PCV13 should be obtained first. All adults aged 65 years and older should be immunized. An adult younger than age 65 years who has certain medical conditions should be immunized. Any person who resides in a nursing home or long-term care facility should be immunized. An adult smoker should be immunized. People with an immunocompromised condition and certain other conditions should receive both PCV13 and PPSV23 vaccines. People with human immunodeficiency virus (HIV) infection should be immunized as soon as possible after diagnosis. Immunization during chemotherapy or radiation therapy should be avoided. Routine use of PPSV23 vaccine is not recommended for American Indians, [place] Natives, or people younger than 65 years unless there are medical conditions that require PPSV23 vaccine. When indicated, people who have unknown immunization and have no record of immunization should receive PPSV23 vaccine. One-time revaccination 5 years after the first dose of PPSV23 is recommended for people aged 19-64 years who have chronic kidney failure, nephrotic syndrome, asplenia, or immunocompromised conditions. People who received 1-2 doses of PPSV23 before age 65 years should receive another dose of PPSV23 vaccine at age 65 years or later if at least 5 years have passed since the previous dose. Doses of PPSV23 are not needed for people immunized with PPSV23 at or after age 65 years.    Hepatitis A vaccine. Adults who wish to be protected from this disease, have certain high-risk conditions, work with hepatitis A-infected animals, work in hepatitis A research labs, or travel to or work in countries with a high rate of hepatitis A should be  immunized. Adults who were previously unvaccinated and who anticipate close contact with an international adoptee during the first 60 days after arrival in the [place] from a country with a high rate of hepatitis A should be immunized.    Hepatitis B vaccine. Adults should be immunized if they wish to be protected from this disease, have certain high-risk conditions, may be exposed to blood or other infectious body fluids, are household contacts or sex partners of hepatitis B positive people, are clients or workers in certain care facilities, or travel to or work in countries with a high rate of hepatitis B.   Preventive Service / Frequency   Ages 65 and over  Blood pressure check.  Lipid and cholesterol check.  Lung cancer screening. / Every year if you are aged 55-80 years and have a 30-pack-year history of smoking and currently smoke or have quit   within the past 15 years. Yearly screening is stopped once you have quit smoking for at least 15 years or develop a health problem that would prevent you from having lung cancer treatment.  Fecal occult blood test (FOBT) of stool. You may not have to do this test if you get a colonoscopy every 10 years.  Flexible sigmoidoscopy** or colonoscopy.** / Every 5 years for a flexible sigmoidoscopy or every 10 years for a colonoscopy beginning at age 50 and continuing until age 75.  Hepatitis C blood test.** / For all people born from 1945 through 1965 and any individual with known risks for hepatitis C.  Abdominal aortic aneurysm (AAA) screening./ Screening current or former smokers or have Hypertension.  Skin self-exam. / Monthly.  Influenza vaccine. / Every year.  Tetanus, diphtheria, and acellular pertussis (Tdap/Td) vaccine.** / 1 dose of Td every 10 years.   Zoster vaccine.** / 1 dose for adults aged 60 years or older.         Pneumococcal 13-valent conjugate (PCV13) vaccine.    Pneumococcal polysaccharide (PPSV23) vaccine.      Hepatitis A vaccine.** / Consult your health care provider.  Hepatitis B vaccine.** / Consult your health care provider. Screening for abdominal aortic aneurysm (AAA)  by ultrasound is recommended for people who have history of high blood pressure or who are current or former smokers.   

## 2015-09-26 NOTE — Progress Notes (Signed)
MEDICARE ANNUAL WELLNESS VISIT AND FOLLOW UP Assessment:   1. Essential hypertension - CBC with Differential - BASIC METABOLIC PANEL WITH GFR - Hepatic function panel - TSH  2. OSA on CPAP Back on CPAP with improvement.   3. Prediabetes Discussed general issues about diabetes pathophysiology and management., Educational material distributed., Suggested low cholesterol diet., Encouraged aerobic exercise., Discussed foot care., Reminded to get yearly retinal exam. - Hemoglobin A1c - HM DIABETES FOOT EXAM  4. Alzheimer's disease Continue follow up neuro  5. BPH with lower urinary symptoms Continue follow up Dr. Gaynelle Arabian, get back on flomax, check urine  6. Cardiac pacemaker in situ Continue cardio follow up  7. Encounter for long-term (current) use of medications - Magnesium  8. Hyperlipidemia - Lipid panel  9. Vitamin D deficiency Continue supplement  10. C V A / STROKE Continue coumadin  11. Chronic atrial fibrillation Continue cardio follow up  12. At high risk for falls Declines PT, DEXA  13. Need for prophylactic vaccination and inoculation against influenza - has had  14. Decreased bone density - declines DEXA at this time  15. Encounter for general adult medical examination with abnormal findings     Plan:   During the course of the visit the patient was educated and counseled about appropriate screening and preventive services including:    Pneumococcal vaccine   Influenza vaccine  Td vaccine  Screening electrocardiogram  Colorectal cancer screening  Diabetes screening  Glaucoma screening  Nutrition counseling   Conditions/risks identified: BMI: Discussed weight loss, diet, and increase physical activity.  Increase physical activity: AHA recommends 150 minutes of physical activity a week.  Medications reviewed Diabetes is at goal, ACE/ARB therapy: No, Reason not on Ace Inhibitor/ARB therapy:  predm Urinary Incontinence is not an  issue: discussed non pharmacology and pharmacology options.  Fall risk: moderate- discussed PT, home fall assessment, medications.    Subjective:  Paul Davies is a 81 y.o. male who presents for Medicare Annual Wellness Visit and 3 month follow up for HTN, hyperlipidemia, prediabetes, and vitamin D Def.  Date of last medicare wellness visit was 09/18/2014  His blood pressure has been controlled at home, today their BP is BP: 100/70 mmHg He does not workout, but he is very active. He denies chest pain, shortness of breath, dizziness.  He is not on cholesterol medication and denies myalgias. His cholesterol is not at goal. The cholesterol last visit was:   Lab Results  Component Value Date   CHOL 185 06/26/2015   HDL 55 06/26/2015   LDLCALC 103 06/26/2015   TRIG 137 06/26/2015   CHOLHDL 3.4 06/26/2015   He has been working on diet and exercise for prediabetes, and denies paresthesia of the feet, polydipsia and polyuria. Last A1C in the office was:  Lab Results  Component Value Date   HGBA1C 6.3* 06/26/2015   Patient is on Vitamin D supplement.   Lab Results  Component Value Date   VD25OH 37 06/26/2015     He is on thyroid medication. His medication was not changed last visit. Patient denies heat / cold intolerance, nervousness and palpitations.  Lab Results  Component Value Date   TSH 2.970 06/26/2015  .  He is on klonopin and gabapentin for his RLS and on CPAP at night and states this helps.  He is on Coumadin for Afib and sees [name], his last Coumadin was normal, he also has a pacemaker.  He denies any abnormal bleeding. He sees Dr. Leonie Man  for early dementia, last MMSE 27/30, he is on Namenda twice daily.  Denies falls in the past year.  He has constipation.   Names of Other Physician/Practitioners you currently use: 1. [facility] here for primary care 2. Dr. Frederico Hamman , eye doctor, last visit Dec 2015 due this Dec 3. Dr. Carman Ching  dentist, last visit q 6 months Patient Care Team: Unk Pinto, MD as PCP - General (Internal Medicine) Jacolyn Reedy, MD as Consulting Physician (Cardiology) Rana Snare, MD as Consulting Physician (Urology) Garvin Fila, MD as Consulting Physician (Neurology)  Medication Review: Current Outpatient Prescriptions on File Prior to Visit  Medication Sig Dispense Refill  . cholecalciferol (VITAMIN D) 1000 UNITS tablet Take 2,000 Units by mouth daily.     . hydrocortisone 2.5 % cream Apply 1 application topically daily. To face    . ketoconazole (NIZORAL) 2 % cream Apply 1 application topically 2 (two) times daily as needed for irritation. 30 g 2  . levothyroxine (SYNTHROID, LEVOTHROID) 50 MCG tablet Take 1 tablet (50 mcg total) by mouth daily before breakfast. 90 tablet 1  . NAMENDA XR 28 MG CP24 24 hr capsule Take 1 capsule (28 mg total) by mouth daily. 90 capsule 1  . tamsulosin (FLOMAX) 0.4 MG CAPS capsule Take 1 capsule (0.4 mg total) by mouth daily. 90 capsule 3  . warfarin (COUMADIN) 6 MG tablet Take 3-9 mg by mouth daily. Take 1.5 tablets every Monday, Wednesday, and Friday. Take 0.5 tablet every Tuesday, Thursday, Saturday and Sunday.     No current facility-administered medications on file prior to visit.    Current Problems (verified) Patient Active Problem List   Diagnosis Date Noted  . Encounter for Medicare annual wellness exam 06/26/2015  . Sick sinus syndrome ([place]) 12/19/2014  . COPD (chronic obstructive pulmonary disease) ([place]) 12/19/2014  . RLS (restless legs syndrome) 12/19/2014  . Cardiac pacemaker in situ   . Encounter for long-term (current) use of medications 03/19/2014  . OSA on CPAP 03/19/2014  . Essential hypertension 03/19/2014  . ASCAD 03/19/2014  . Alzheimer's disease 03/06/2014  . Hyperlipidemia   . Vitamin D deficiency   . Prediabetes   . C V A / STROKE 09/05/2010  . Atrial fibrillation ([place]) 12/21/2008  . BPH 12/20/2008    Screening  Tests Health Maintenance  Topic Date Due  . ZOSTAVAX  04/15/1992  . INFLUENZA VACCINE  06/23/2016  . TETANUS/TDAP  12/08/2022  . PNA vac Low Risk Adult  Completed    Immunization History  Administered Date(s) Administered  . Influenza, High Dose Seasonal PF 09/18/2014, 09/09/2015  . Influenza-Unspecified 10/04/2013  . Pneumococcal Conjugate-13 12/19/2014  . Pneumococcal Polysaccharide-23 10/17/2013  . Td 12/08/2012   Tetanus: 2014  Pneumovax: 2014  Prevnar: 2016 Flu vaccine: 2016 Zostavax: due, declines due to cost DEXA: 08/2012 osteopenia DUE but declines Echo 2006 Stress test 01/2015 Colonoscopy: 01/15/2009 Dr. Ardis Hughs.  EGD: N/A  CXR 01/2013 COPD changes  Allergies Allergies  Allergen Reactions  . Bee Venom    Surgical history Past Surgical History  Procedure Laterality Date  . Pacemaker insertion    . Pacemaker generator change N/A 05/04/2014    Procedure: PACEMAKER GENERATOR CHANGE;  Surgeon: [name], MD;  Location: [facility] CATH LAB;  Service: Cardiovascular;  Laterality: N/A;   Family history Family History  Problem Relation Age of Onset  . Heart attack Father    Risk Factors: Tobacco Social History  Substance Use Topics  . Smoking  status: Never Smoker   . Smokeless tobacco: Never Used  . Alcohol Use: No   MEDICARE WELLNESS OBJECTIVES: Tobacco use: He does not smoke.  Patient is not a former smoker. If yes, counseling given Alcohol Current alcohol use: none Osteoporosis: dietary calcium and/or vitamin D deficiency, History of fracture in the past year: no Fall risk: Moderate Risk Hearing: impaired Visual acuity: impaired,  does perform annual eye exam Diet: in general, a "healthy" diet   Physical activity: Current Exercise Habits:: The patient does not participate in regular exercise at present Cardiac risk factors: Cardiac Risk Factors include: advanced age (>68men, >25 women);dyslipidemia;hypertension;male gender;obesity (BMI >30kg/m2);sedentary  lifestyle Depression/mood screen:   Depression screen [facility] 2/9 09/26/2015  Decreased Interest 0  Down, Depressed, Hopeless 0  PHQ - 2 Score 0    ADLs:  In your present state of health, do you have any difficulty performing the following activities: 09/26/2015  Hearing? Y  Vision? Y  Difficulty concentrating or making decisions? Y  Walking or climbing stairs? N  Dressing or bathing? N  Doing errands, shopping? Y  Preparing Food and eating ? N  Using the Toilet? N  In the past six months, have you accidently leaked urine? N  Do you have problems with loss of bowel control? N  Managing your Medications? Y  Managing your Finances? N  Housekeeping or managing your Housekeeping? Y     Cognitive Testing  Alert? Yes  Normal Appearance?Yes  Oriented to person? Yes  Place? Yes   Time? Yes  Recall of three objects?  No  Can perform simple calculations? Yes  Displays appropriate judgment?Yes  Can read the correct time from a watch face?Yes  EOL planning: Does patient have an advance directive?: Yes Type of Advance Directive: [place], Living will Does patient want to make changes to advanced directive?: No - Patient declined Copy of advanced directive(s) in chart?: No - copy requested   Objective:   Blood pressure 100/70, pulse 81, temperature 97.5 F (36.4 C), temperature source Temporal, resp. rate 14, height 5\' 8"  (1.727 m), weight 170 lb (77.111 kg), SpO2 94 %. Body mass index is 25.85 kg/(m^2).  General appearance: alert, no distress, WD/WN, male General Appearance: Well nourished, in no apparent distress. Eyes: PERRLA, EOMs, conjunctiva no swelling or erythema, normal fundi and vessels. Sinuses: No Frontal/maxillary tenderness ENT/Mouth: Ext aud canals clear, normal light reflex with TMs without erythema, bulging. Good dentition. No erythema, swelling, or exudate on post pharynx. Tonsils not swollen or erythematous. Hearing decreased, he has hearing  aids. Neck: Supple, thyroid normal. No bruits Respiratory: Respiratory effort normal, BS equal bilaterally without rales, rhonchi, wheezing or stridor. Cardio: Irreg, Irreg with 2/6 systolic murmur without rubs or gallops. Brisk peripheral pulses without edema.  Chest: symmetric, with normal excursions and percussion. Abdomen: Soft, +BS. Non tender, no guarding, rebound, hernias, masses, or organomegaly.  Lymphatics: Non tender without lymphadenopathy.  Genitourinary: defer Musculoskeletal: Full ROM all peripheral extremities,4/5 strength Skin: Warm, dry without rashes, ecchymosis. Several places on his face,ears, and left temple that need to be frozen/removed.  Neuro: Cranial nerves intact, reflexes equal bilaterally. Normal muscle tone, no cerebellar symptoms. Sensation intact.  Psych: Awake and oriented X 3, flat affect, Insight and Judgment appropriate.   Medicare Attestation I have personally reviewed: The patient's medical and social history Their use of alcohol, tobacco or illicit drugs Their current medications and supplements The patient's functional ability including ADLs,fall risks, home safety risks, cognitive, and hearing and visual  impairment Diet and physical activities Evidence for depression or mood disorders  The patient's weight, height, BMI, and visual acuity have been recorded in the chart.  I have made referrals, counseling, and provided education to the patient based on review of the above and I have provided the patient with a written personalized care plan for preventive services.     Vicie Mutters, PA-C   09/26/2015

## 2015-09-27 LAB — URINE CULTURE
Colony Count: NO GROWTH
Organism ID, Bacteria: NO GROWTH

## 2015-09-27 LAB — URINALYSIS, ROUTINE W REFLEX MICROSCOPIC
BILIRUBIN URINE: NEGATIVE
GLUCOSE, UA: NEGATIVE
HGB URINE DIPSTICK: NEGATIVE
Ketones, ur: NEGATIVE
LEUKOCYTES UA: NEGATIVE
Nitrite: NEGATIVE
PROTEIN: NEGATIVE
Specific Gravity, Urine: 1.019 (ref 1.001–1.035)
pH: 5.5 (ref 5.0–8.0)

## 2015-09-30 DIAGNOSIS — I480 Paroxysmal atrial fibrillation: Secondary | ICD-10-CM | POA: Diagnosis not present

## 2015-09-30 DIAGNOSIS — I951 Orthostatic hypotension: Secondary | ICD-10-CM | POA: Diagnosis not present

## 2015-09-30 DIAGNOSIS — Z8673 Personal history of transient ischemic attack (TIA), and cerebral infarction without residual deficits: Secondary | ICD-10-CM | POA: Diagnosis not present

## 2015-09-30 DIAGNOSIS — E039 Hypothyroidism, unspecified: Secondary | ICD-10-CM | POA: Diagnosis not present

## 2015-09-30 DIAGNOSIS — Z950 Presence of cardiac pacemaker: Secondary | ICD-10-CM | POA: Diagnosis not present

## 2015-09-30 DIAGNOSIS — Z7901 Long term (current) use of anticoagulants: Secondary | ICD-10-CM | POA: Diagnosis not present

## 2015-09-30 DIAGNOSIS — G4733 Obstructive sleep apnea (adult) (pediatric): Secondary | ICD-10-CM | POA: Diagnosis not present

## 2015-09-30 DIAGNOSIS — E784 Other hyperlipidemia: Secondary | ICD-10-CM | POA: Diagnosis not present

## 2015-10-30 DIAGNOSIS — Z950 Presence of cardiac pacemaker: Secondary | ICD-10-CM | POA: Diagnosis not present

## 2015-10-30 DIAGNOSIS — E039 Hypothyroidism, unspecified: Secondary | ICD-10-CM | POA: Diagnosis not present

## 2015-10-30 DIAGNOSIS — I480 Paroxysmal atrial fibrillation: Secondary | ICD-10-CM | POA: Diagnosis not present

## 2015-10-30 DIAGNOSIS — G4733 Obstructive sleep apnea (adult) (pediatric): Secondary | ICD-10-CM | POA: Diagnosis not present

## 2015-10-30 DIAGNOSIS — E784 Other hyperlipidemia: Secondary | ICD-10-CM | POA: Diagnosis not present

## 2015-10-30 DIAGNOSIS — Z8673 Personal history of transient ischemic attack (TIA), and cerebral infarction without residual deficits: Secondary | ICD-10-CM | POA: Diagnosis not present

## 2015-10-30 DIAGNOSIS — N401 Enlarged prostate with lower urinary tract symptoms: Secondary | ICD-10-CM | POA: Diagnosis not present

## 2015-10-30 DIAGNOSIS — I951 Orthostatic hypotension: Secondary | ICD-10-CM | POA: Diagnosis not present

## 2015-10-30 DIAGNOSIS — Z7901 Long term (current) use of anticoagulants: Secondary | ICD-10-CM | POA: Diagnosis not present

## 2015-10-30 DIAGNOSIS — N138 Other obstructive and reflux uropathy: Secondary | ICD-10-CM | POA: Diagnosis not present

## 2015-11-26 DIAGNOSIS — Z8673 Personal history of transient ischemic attack (TIA), and cerebral infarction without residual deficits: Secondary | ICD-10-CM | POA: Diagnosis not present

## 2015-11-26 DIAGNOSIS — I951 Orthostatic hypotension: Secondary | ICD-10-CM | POA: Diagnosis not present

## 2015-11-26 DIAGNOSIS — Z950 Presence of cardiac pacemaker: Secondary | ICD-10-CM | POA: Diagnosis not present

## 2015-11-26 DIAGNOSIS — E784 Other hyperlipidemia: Secondary | ICD-10-CM | POA: Diagnosis not present

## 2015-11-26 DIAGNOSIS — E039 Hypothyroidism, unspecified: Secondary | ICD-10-CM | POA: Diagnosis not present

## 2015-11-26 DIAGNOSIS — I480 Paroxysmal atrial fibrillation: Secondary | ICD-10-CM | POA: Diagnosis not present

## 2015-11-26 DIAGNOSIS — G4733 Obstructive sleep apnea (adult) (pediatric): Secondary | ICD-10-CM | POA: Diagnosis not present

## 2015-11-26 DIAGNOSIS — Z7901 Long term (current) use of anticoagulants: Secondary | ICD-10-CM | POA: Diagnosis not present

## 2015-12-10 DIAGNOSIS — E039 Hypothyroidism, unspecified: Secondary | ICD-10-CM | POA: Diagnosis not present

## 2015-12-10 DIAGNOSIS — Z8673 Personal history of transient ischemic attack (TIA), and cerebral infarction without residual deficits: Secondary | ICD-10-CM | POA: Diagnosis not present

## 2015-12-10 DIAGNOSIS — I480 Paroxysmal atrial fibrillation: Secondary | ICD-10-CM | POA: Diagnosis not present

## 2015-12-10 DIAGNOSIS — I951 Orthostatic hypotension: Secondary | ICD-10-CM | POA: Diagnosis not present

## 2015-12-10 DIAGNOSIS — Z950 Presence of cardiac pacemaker: Secondary | ICD-10-CM | POA: Diagnosis not present

## 2015-12-10 DIAGNOSIS — G4733 Obstructive sleep apnea (adult) (pediatric): Secondary | ICD-10-CM | POA: Diagnosis not present

## 2015-12-10 DIAGNOSIS — Z7901 Long term (current) use of anticoagulants: Secondary | ICD-10-CM | POA: Diagnosis not present

## 2015-12-10 DIAGNOSIS — E784 Other hyperlipidemia: Secondary | ICD-10-CM | POA: Diagnosis not present

## 2015-12-11 DIAGNOSIS — I951 Orthostatic hypotension: Secondary | ICD-10-CM | POA: Diagnosis not present

## 2015-12-11 DIAGNOSIS — G4733 Obstructive sleep apnea (adult) (pediatric): Secondary | ICD-10-CM | POA: Diagnosis not present

## 2015-12-11 DIAGNOSIS — Z950 Presence of cardiac pacemaker: Secondary | ICD-10-CM | POA: Diagnosis not present

## 2015-12-11 DIAGNOSIS — Z8673 Personal history of transient ischemic attack (TIA), and cerebral infarction without residual deficits: Secondary | ICD-10-CM | POA: Diagnosis not present

## 2015-12-11 DIAGNOSIS — I480 Paroxysmal atrial fibrillation: Secondary | ICD-10-CM | POA: Diagnosis not present

## 2015-12-11 DIAGNOSIS — Z7901 Long term (current) use of anticoagulants: Secondary | ICD-10-CM | POA: Diagnosis not present

## 2015-12-11 DIAGNOSIS — E039 Hypothyroidism, unspecified: Secondary | ICD-10-CM | POA: Diagnosis not present

## 2015-12-11 DIAGNOSIS — E784 Other hyperlipidemia: Secondary | ICD-10-CM | POA: Diagnosis not present

## 2015-12-23 ENCOUNTER — Ambulatory Visit (INDEPENDENT_AMBULATORY_CARE_PROVIDER_SITE_OTHER): Payer: Medicare Other | Admitting: Physician Assistant

## 2015-12-23 ENCOUNTER — Encounter: Payer: Self-pay | Admitting: Physician Assistant

## 2015-12-23 ENCOUNTER — Ambulatory Visit (HOSPITAL_COMMUNITY)
Admission: RE | Admit: 2015-12-23 | Discharge: 2015-12-23 | Disposition: A | Discharge status: Home / Self Care | Payer: Medicare Other | Source: Ambulatory Visit | Attending: Physician Assistant | Admitting: Physician Assistant

## 2015-12-23 ENCOUNTER — Other Ambulatory Visit: Payer: Self-pay

## 2015-12-23 VITALS — BP 110/80 | HR 92 | Temp 97.3°F | Resp 14 | Ht 68.0 in | Wt 176.4 lb

## 2015-12-23 DIAGNOSIS — Z950 Presence of cardiac pacemaker: Secondary | ICD-10-CM

## 2015-12-23 DIAGNOSIS — G4733 Obstructive sleep apnea (adult) (pediatric): Secondary | ICD-10-CM

## 2015-12-23 DIAGNOSIS — G2581 Restless legs syndrome: Secondary | ICD-10-CM

## 2015-12-23 DIAGNOSIS — E785 Hyperlipidemia, unspecified: Secondary | ICD-10-CM

## 2015-12-23 DIAGNOSIS — E559 Vitamin D deficiency, unspecified: Secondary | ICD-10-CM

## 2015-12-23 DIAGNOSIS — I480 Paroxysmal atrial fibrillation: Secondary | ICD-10-CM

## 2015-12-23 DIAGNOSIS — Z79899 Other long term (current) drug therapy: Secondary | ICD-10-CM | POA: Diagnosis not present

## 2015-12-23 DIAGNOSIS — Z1331 Encounter for screening for depression: Secondary | ICD-10-CM

## 2015-12-23 DIAGNOSIS — I6789 Other cerebrovascular disease: Secondary | ICD-10-CM | POA: Diagnosis not present

## 2015-12-23 DIAGNOSIS — N320 Bladder-neck obstruction: Secondary | ICD-10-CM

## 2015-12-23 DIAGNOSIS — Z1389 Encounter for screening for other disorder: Secondary | ICD-10-CM | POA: Diagnosis not present

## 2015-12-23 DIAGNOSIS — I10 Essential (primary) hypertension: Secondary | ICD-10-CM | POA: Diagnosis not present

## 2015-12-23 DIAGNOSIS — G300 Alzheimer's disease with early onset: Secondary | ICD-10-CM

## 2015-12-23 DIAGNOSIS — F0280 Dementia in other diseases classified elsewhere without behavioral disturbance: Secondary | ICD-10-CM

## 2015-12-23 DIAGNOSIS — J449 Chronic obstructive pulmonary disease, unspecified: Secondary | ICD-10-CM | POA: Insufficient documentation

## 2015-12-23 DIAGNOSIS — I2510 Atherosclerotic heart disease of native coronary artery without angina pectoris: Secondary | ICD-10-CM | POA: Diagnosis not present

## 2015-12-23 DIAGNOSIS — I495 Sick sinus syndrome: Secondary | ICD-10-CM | POA: Diagnosis not present

## 2015-12-23 DIAGNOSIS — R7309 Other abnormal glucose: Secondary | ICD-10-CM

## 2015-12-23 DIAGNOSIS — R05 Cough: Secondary | ICD-10-CM | POA: Insufficient documentation

## 2015-12-23 LAB — HEPATIC FUNCTION PANEL
ALK PHOS: 50 U/L (ref 40–115)
ALT: 10 U/L (ref 9–46)
AST: 12 U/L (ref 10–35)
Albumin: 3.9 g/dL (ref 3.6–5.1)
BILIRUBIN DIRECT: 0.1 mg/dL (ref <=0.2)
BILIRUBIN INDIRECT: 0.5 mg/dL (ref 0.2–1.2)
BILIRUBIN TOTAL: 0.6 mg/dL (ref 0.2–1.2)
TOTAL PROTEIN: 6.2 g/dL (ref 6.1–8.1)

## 2015-12-23 LAB — TSH: TSH: 1.869 u[IU]/mL (ref 0.350–4.500)

## 2015-12-23 LAB — URINALYSIS, MICROSCOPIC ONLY
Bacteria, UA: NONE SEEN [HPF]
CASTS: NONE SEEN [LPF]
Crystals: NONE SEEN [HPF]
SQUAMOUS EPITHELIAL / LPF: NONE SEEN [HPF] (ref <=5)
YEAST: NONE SEEN [HPF]

## 2015-12-23 LAB — URINALYSIS, ROUTINE W REFLEX MICROSCOPIC
Bilirubin Urine: NEGATIVE
Glucose, UA: NEGATIVE
KETONES UR: NEGATIVE
Leukocytes, UA: NEGATIVE
NITRITE: NEGATIVE
PROTEIN: NEGATIVE
Specific Gravity, Urine: 1.03 (ref 1.001–1.035)
pH: 5.5 (ref 5.0–8.0)

## 2015-12-23 LAB — BASIC METABOLIC PANEL WITH GFR
BUN: 22 mg/dL (ref 7–25)
CO2: 27 mmol/L (ref 20–31)
Calcium: 9.2 mg/dL (ref 8.6–10.3)
Chloride: 107 mmol/L (ref 98–110)
Creat: 0.9 mg/dL (ref 0.70–1.11)
GFR, EST NON AFRICAN AMERICAN: 79 mL/min (ref >=60)
GLUCOSE: 106 mg/dL — AB (ref 65–99)
POTASSIUM: 4.5 mmol/L (ref 3.5–5.3)
SODIUM: 139 mmol/L (ref 135–146)

## 2015-12-23 LAB — CBC WITH DIFFERENTIAL/PLATELET
BASOS ABS: 0 10*3/uL (ref 0.0–0.1)
Basophils Relative: 0 % (ref 0–1)
EOS PCT: 2 % (ref 0–5)
Eosinophils Absolute: 0.2 10*3/uL (ref 0.0–0.7)
HEMATOCRIT: 43.4 % (ref 39.0–52.0)
HEMOGLOBIN: 14.5 g/dL (ref 13.0–17.0)
LYMPHS PCT: 23 % (ref 12–46)
Lymphs Abs: 1.9 10*3/uL (ref 0.7–4.0)
MCH: 30.5 pg (ref 26.0–34.0)
MCHC: 33.4 g/dL (ref 30.0–36.0)
MCV: 91.4 fL (ref 78.0–100.0)
MPV: 10.7 fL (ref 8.6–12.4)
Monocytes Absolute: 0.6 10*3/uL (ref 0.1–1.0)
Monocytes Relative: 7 % (ref 3–12)
NEUTROS ABS: 5.5 10*3/uL (ref 1.7–7.7)
Neutrophils Relative %: 68 % (ref 43–77)
Platelets: 181 10*3/uL (ref 150–400)
RBC: 4.75 MIL/uL (ref 4.22–5.81)
RDW: 14.1 % (ref 11.5–15.5)
WBC: 8.1 10*3/uL (ref 4.0–10.5)

## 2015-12-23 LAB — LIPID PANEL
CHOL/HDL RATIO: 3.5 ratio (ref <=5.0)
Cholesterol: 200 mg/dL (ref 125–200)
HDL: 57 mg/dL (ref >=40)
LDL CALC: 109 mg/dL (ref <130)
TRIGLYCERIDES: 172 mg/dL — AB (ref <150)
VLDL: 34 mg/dL — AB (ref <30)

## 2015-12-23 LAB — MAGNESIUM: Magnesium: 1.9 mg/dL (ref 1.5–2.5)

## 2015-12-23 LAB — HEMOGLOBIN A1C
HEMOGLOBIN A1C: 6 % — AB (ref <5.7)
Mean Plasma Glucose: 126 mg/dL — ABNORMAL HIGH (ref <117)

## 2015-12-23 NOTE — Patient Instructions (Signed)
Get chest x ray at [facility] hospital  Preventive Care for Adults A healthy lifestyle and preventive care can promote health and wellness. Preventive health guidelines for men include the following key practices:  A routine yearly physical is a good way to check with your health care provider about your health and preventative screening. It is a chance to share any concerns and updates on your health and to receive a thorough exam.  Visit your dentist for a routine exam and preventative care every 6 months. Brush your teeth twice a day and floss once a day. Good oral hygiene prevents tooth decay and gum disease.  The frequency of eye exams is based on your age, health, family medical history, use of contact lenses, and other factors. Follow your health care provider's recommendations for frequency of eye exams.  Eat a healthy diet. Foods such as vegetables, fruits, whole grains, low-fat dairy products, and lean protein foods contain the nutrients you need without too many calories. Decrease your intake of foods high in solid fats, added sugars, and salt. Eat the right amount of calories for you.Get information about a proper diet from your health care provider, if necessary.  Regular physical exercise is one of the most important things you can do for your health. Most adults should get at least 150 minutes of moderate-intensity exercise (any activity that increases your heart rate and causes you to sweat) each week. In addition, most adults need muscle-strengthening exercises on 2 or more days a week.  Maintain a healthy weight. The body mass index (BMI) is a screening tool to identify possible weight problems. It provides an estimate of body fat based on height and weight. Your health care provider can find your BMI and can help you achieve or maintain a healthy weight.For adults 20 years and older:  A BMI below 18.5 is considered underweight.  A BMI of 18.5 to 24.9 is normal.  A BMI of 25 to  29.9 is considered overweight.  A BMI of 30 and above is considered obese.  Maintain normal blood lipids and cholesterol levels by exercising and minimizing your intake of saturated fat. Eat a balanced diet with plenty of fruit and vegetables. Blood tests for lipids and cholesterol should begin at age 42 and be repeated every 5 years. If your lipid or cholesterol levels are high, you are over 50, or you are at high risk for heart disease, you may need your cholesterol levels checked more frequently.Ongoing high lipid and cholesterol levels should be treated with medicines if diet and exercise are not working.  If you smoke, find out from your health care provider how to quit. If you do not use tobacco, do not start.  Lung cancer screening is recommended for adults aged 27-80 years who are at high risk for developing lung cancer because of a history of smoking. A yearly low-dose CT scan of the lungs is recommended for people who have at least a 30-pack-year history of smoking and are a current smoker or have quit within the past 15 years. A pack year of smoking is smoking an average of 1 pack of cigarettes a day for 1 year (for example: 1 pack a day for 30 years or 2 packs a day for 15 years). Yearly screening should continue until the smoker has stopped smoking for at least 15 years. Yearly screening should be stopped for people who develop a health problem that would prevent them from having lung cancer treatment.  If you choose  to drink alcohol, do not have more than 2 drinks per day. One drink is considered to be 12 ounces (355 mL) of beer, 5 ounces (148 mL) of wine, or 1.5 ounces (44 mL) of liquor.  Avoid use of street drugs. Do not share needles with anyone. Ask for help if you need support or instructions about stopping the use of drugs.  High blood pressure causes heart disease and increases the risk of stroke. Your blood pressure should be checked at least every 1-2 years. Ongoing high blood  pressure should be treated with medicines, if weight loss and exercise are not effective.  If you are 62-51 years old, ask your health care provider if you should take aspirin to prevent heart disease.  Diabetes screening involves taking a blood sample to check your fasting blood sugar level. Testing should be considered at a younger age or be carried out more frequently if you are overweight and have at least 1 risk factor for diabetes.  Colorectal cancer can be detected and often prevented. Most routine colorectal cancer screening begins at the age of 45 and continues through age 42. However, your health care provider may recommend screening at an earlier age if you have risk factors for colon cancer. On a yearly basis, your health care provider may provide home test kits to check for hidden blood in the stool. Use of a small camera at the end of a tube to directly examine the colon (sigmoidoscopy or colonoscopy) can detect the earliest forms of colorectal cancer. Talk to your health care provider about this at age 4, when routine screening begins. Direct exam of the colon should be repeated every 5-10 years through age 27, unless early forms of precancerous polyps or small growths are found.  Hepatitis C blood testing is recommended for all people born from [birth year] through 1965 and any individual with known risks for hepatitis C.  New guidelines recommend a once time screening for HIV.   Screening for abdominal aortic aneurysm (AAA)  by ultrasound is recommended for people who have history of high blood pressure or who are current or former smokers.  Healthy men should  receive prostate-specific antigen (PSA) blood tests as part of routine cancer screening. Talk with your health care provider about prostate cancer screening.  Testicular cancer screening is  recommended for adult males. Screening includes self-exam, a health care provider exam, and other screening tests. Consult with your health care  provider about any symptoms you have or any concerns you have about testicular cancer.  Use sunscreen. Apply sunscreen liberally and repeatedly throughout the day. You should seek shade when your shadow is shorter than you. Protect yourself by wearing long sleeves, pants, a wide-brimmed hat, and sunglasses year round, whenever you are outdoors.  Once a month, do a whole-body skin exam, using a mirror to look at the skin on your back. Tell your health care provider about new moles, moles that have irregular borders, moles that are larger than a pencil eraser, or moles that have changed in shape or color.  Stay current with required vaccines (immunizations).  Influenza vaccine. All adults should be immunized every year.  Tetanus, diphtheria, and acellular pertussis (Td, Tdap) vaccine. An adult who has not previously received Tdap or who does not know his vaccine status should receive 1 dose of Tdap. This initial dose should be followed by tetanus and diphtheria toxoids (Td) booster doses every 10 years. Adults with an unknown or incomplete history of completing a 3-dose  immunization series with Td-containing vaccines should begin or complete a primary immunization series including a Tdap dose. Adults should receive a Td booster every 10 years.  Zoster vaccine. One dose is recommended for adults aged 76 years or older unless certain conditions are present.    PREVNAR - Pneumococcal 13-valent conjugate (PCV13) vaccine. When indicated, a person who is uncertain of his immunization history and has no record of immunization should receive the PCV13 vaccine. An adult aged 53 years or older who has certain medical conditions and has not been previously immunized should receive 1 dose of PCV13 vaccine. This PCV13 should be followed with a dose of pneumococcal polysaccharide (PPSV23) vaccine. The PPSV23 vaccine dose should be obtained at least 8 weeks after the dose of PCV13 vaccine. An adult aged 63 years or  older who has certain medical conditions and previously received 1 or more doses of PPSV23 vaccine should receive 1 dose of PCV13. The PCV13 vaccine dose should be obtained 1 or more years after the last PPSV23 vaccine dose.    PNEUMOVAX - Pneumococcal polysaccharide (PPSV23) vaccine. When PCV13 is also indicated, PCV13 should be obtained first. All adults aged 5 years and older should be immunized. An adult younger than age 4 years who has certain medical conditions should be immunized. Any person who resides in a nursing home or long-term care facility should be immunized. An adult smoker should be immunized. People with an immunocompromised condition and certain other conditions should receive both PCV13 and PPSV23 vaccines. People with human immunodeficiency virus (HIV) infection should be immunized as soon as possible after diagnosis. Immunization during chemotherapy or radiation therapy should be avoided. Routine use of PPSV23 vaccine is not recommended for American Indians, [place] Natives, or people younger than 65 years unless there are medical conditions that require PPSV23 vaccine. When indicated, people who have unknown immunization and have no record of immunization should receive PPSV23 vaccine. One-time revaccination 5 years after the first dose of PPSV23 is recommended for people aged 19-64 years who have chronic kidney failure, nephrotic syndrome, asplenia, or immunocompromised conditions. People who received 1-2 doses of PPSV23 before age 75 years should receive another dose of PPSV23 vaccine at age 35 years or later if at least 5 years have passed since the previous dose. Doses of PPSV23 are not needed for people immunized with PPSV23 at or after age 53 years.    Hepatitis A vaccine. Adults who wish to be protected from this disease, have certain high-risk conditions, work with hepatitis A-infected animals, work in hepatitis A research labs, or travel to or work in countries with a high  rate of hepatitis A should be immunized. Adults who were previously unvaccinated and who anticipate close contact with an international adoptee during the first 60 days after arrival in the [place] from a country with a high rate of hepatitis A should be immunized.    Hepatitis B vaccine. Adults should be immunized if they wish to be protected from this disease, have certain high-risk conditions, may be exposed to blood or other infectious body fluids, are household contacts or sex partners of hepatitis B positive people, are clients or workers in certain care facilities, or travel to or work in countries with a high rate of hepatitis B.   Preventive Service / Frequency   Ages 2 and over  Blood pressure check.  Lipid and cholesterol check.  Lung cancer screening. / Every year if you are aged 40-80 years and have a 30-pack-year history  of smoking and currently smoke or have quit within the past 15 years. Yearly screening is stopped once you have quit smoking for at least 15 years or develop a health problem that would prevent you from having lung cancer treatment.  Fecal occult blood test (FOBT) of stool. You may not have to do this test if you get a colonoscopy every 10 years.  Flexible sigmoidoscopy** or colonoscopy.** / Every 5 years for a flexible sigmoidoscopy or every 10 years for a colonoscopy beginning at age 56 and continuing until age 46.  Hepatitis C blood test.** / For all people born from [birth year] through 1965 and any individual with known risks for hepatitis C.  Abdominal aortic aneurysm (AAA) screening./ Screening current or former smokers or have Hypertension.  Skin self-exam. / Monthly.  Influenza vaccine. / Every year.  Tetanus, diphtheria, and acellular pertussis (Tdap/Td) vaccine.** / 1 dose of Td every 10 years.   Zoster vaccine.** / 1 dose for adults aged 41 years or older.         Pneumococcal 13-valent conjugate (PCV13) vaccine.    Pneumococcal  polysaccharide (PPSV23) vaccine.     Hepatitis A vaccine.** / Consult your health care provider.  Hepatitis B vaccine.** / Consult your health care provider. Screening for abdominal aortic aneurysm (AAA)  by ultrasound is recommended for people who have history of high blood pressure or who are current or former smokers.

## 2015-12-23 NOTE — Progress Notes (Signed)
Complete Physical  Assessment and Plan: 1. Sick sinus syndrome Continue cardio follow up  2. Essential hypertension - DASH diet, exercise and monitor at home. Call if greater than 130/80.  - CBC with Differential/Platelet - BASIC METABOLIC PANEL WITH GFR - Hepatic function panel - TSH - Urinalysis, Routine w reflex microscopic - Microalbumin / creatinine urine ratio  3. Chronic atrial fibrillation Continue coumadin, Continue cardio follow up  4. C V A / STROKE On coumadin, + high fall risk but no bleeding symptoms at this time and very high risk CVA.   5. Atherosclerosis of native coronary artery of native heart without angina pectoris Control blood pressure, cholesterol, glucose, increase exercise.  Continue cardio follow up  6. Prediabetes Discussed general issues about diabetes pathophysiology and management., Educational material distributed., Suggested low cholesterol diet., Encouraged aerobic exercise., Discussed foot care., Reminded to get yearly retinal exam. - Hemoglobin A1c - Insulin, fasting - HM DIABETES FOOT EXAM  7. Hyperlipidemia -continue medications, check lipids, decrease fatty foods, increase activity.  - Lipid panel  8. OSA on CPAP Wrote RX for new machine, [facility]  9. Alzheimer's disease Continue Namenda, follow up Dr. Leonie Man  10. BPH Following up with Dr. Risa Grill, can try to restart tamsulosin AT NIGHT  11. Cardiac pacemaker in situ Continue cardio follow up  12. Vitamin D deficiency Continue supplement  13. Encounter for long-term (current) use of medications - Magnesium  14. Chronic obstructive pulmonary disease, unspecified COPD, unspecified chronic bronchitis type Cough but lungs CTAB, declines CXR/formal work up, will try nexium samples rule out GERD  15. At high risk for falls Declines PT  16. RLS (restless legs syndrome) Do just the the tylenol PM off klonopin due to fall risk.   17. Depression  screen negative   Discussed med's effects and SE's. Screening labs and tests as requested with regular follow-up as recommended. OVER 40 minutes of exam, counseling, chart review, referral performed Future Appointments Date Time Provider [place]  03/16/2016 9:30 AM Garvin Fila, MD [facility] None  12/24/2016 10:00 AM Vicie Mutters, PA-C [facility] None     HPI Patient presents for a complete physical.   His blood pressure has been controlled at home, today their BP is BP: 110/80 mmHg He does not workout but is active. He denies chest pain, shortness of breath, dizziness.  He has Afib/SSS and is on coumadin and has pacemaker, follows with [name] and coumadin clinic. Declines nose bleeds, blood in stool, blood in urine.  He has early dementia, he is on Namenda and follows with Dr. Leonie Man, he is high fall risk, denies any recent falls in the past year. He is here by himself, wife is in the waiting room. He does not drive, denies depression/anxiety, no trouble sleeping.  He is not on cholesterol medication and denies myalgias. His cholesterol is at goal less than 100. The cholesterol last visit was:   Lab Results  Component Value Date   CHOL 194 09/26/2015   HDL 61 09/26/2015   LDLCALC 97 09/26/2015   TRIG 181* 09/26/2015   CHOLHDL 3.2 09/26/2015  He has been working on diet and exercise for prediabetes, and denies paresthesia of the feet, polydipsia, polyuria and visual disturbances. Last A1C in the office was:  Lab Results  Component Value Date   HGBA1C 5.7* 09/26/2015  Patient is on Vitamin D supplement.   Lab Results  Component Value Date   VD25OH 37 06/26/2015  He is  on klonopin and gabapentin at night for RLS, he has been out of these meds for 2 weeks and wife has been giving him Tylenol PM which has helped and is on CPAP for OSA. He is only on this part of the time due to problems with the machine, he would like a new machine.  He is on thyroid medication but has  been out of for unknown period of time. His medication was not changed last visit.   Lab Results  Component Value Date   TSH 3.173 09/26/2015    Current Medications:  Current Outpatient Prescriptions on File Prior to Visit  Medication Sig Dispense Refill  . cholecalciferol (VITAMIN D) 1000 UNITS tablet Take 2,000 Units by mouth daily.     . hydrocortisone 2.5 % cream Apply 1 application topically daily. To face    . ketoconazole (NIZORAL) 2 % cream Apply 1 application topically 2 (two) times daily as needed for irritation. 30 g 2  . levothyroxine (SYNTHROID, LEVOTHROID) 50 MCG tablet Take 1 tablet (50 mcg total) by mouth daily before breakfast. 90 tablet 1  . NAMENDA XR 28 MG CP24 24 hr capsule Take 1 capsule (28 mg total) by mouth daily. 90 capsule 1  . tamsulosin (FLOMAX) 0.4 MG CAPS capsule Take 1 capsule (0.4 mg total) by mouth daily. 90 capsule 3  . warfarin (COUMADIN) 6 MG tablet Take 3-9 mg by mouth daily. Take 1.5 tablets every Monday, Wednesday, and Friday. Take 0.5 tablet every Tuesday, Thursday, Saturday and Sunday.     No current facility-administered medications on file prior to visit.   Health Maintenance:  Immunization History  Administered Date(s) Administered  . Influenza, High Dose Seasonal PF 09/18/2014, 09/09/2015  . Influenza-Unspecified 10/04/2013  . Pneumococcal Conjugate-13 12/19/2014  . Pneumococcal Polysaccharide-23 10/17/2013  . Td 12/08/2012   Tetanus: 2014  Pneumovax: 2014  Prevnar: 2016 Flu vaccine:08/2015.  Zostavax: declines due cost DEXA: 08/2012 osteopenia declines   Colonoscopy: 01/15/2009 Dr. Ardis Hughs.  EGD: N/A  CXR 01/2013 COPD changes, suggest repeat Echo 2006 Stress test 01/2015 MRI brain 2006 Eye Exam: Dr. Frederico Hamman Dec 2015, wears glasses, overdue Dentist: Dr. Carman Ching q [facility]   Patient Care Team: Unk Pinto, MD as PCP - General (Internal Medicine) Jacolyn Reedy, MD as Consulting Physician  (Cardiology) Rana Snare, MD as Consulting Physician (Urology) Garvin Fila, MD as Consulting Physician (Neurology)  Allergies:  Allergies  Allergen Reactions  . Bee Venom    Medical History:  Past Medical History  Diagnosis Date  . Stroke [facility])     twice  . Hyperlipidemia   . Hypertension   . OSA (obstructive sleep apnea)   . RLS (restless legs syndrome)   . Osteopenia   . Thyroid disease   . Vitamin D deficiency   . Pacemaker medtronic   . DJD (degenerative joint disease)   . Atherosclerosis of aorta ([place]) 2010    noted on lumbar CT in 2010  . COPD (chronic obstructive pulmonary disease) ([place])     On CXR  . Sinoatrial node dysfunction (HCC)   . Prediabetes   . Atrial fibrillation [facility])    Surgical History:  Past Surgical History  Procedure Laterality Date  . Pacemaker insertion    . Pacemaker generator change N/A 05/04/2014    Procedure: PACEMAKER GENERATOR CHANGE;  Surgeon: [name], MD;  Location: [facility] CATH LAB;  Service: Cardiovascular;  Laterality: N/A;   Family History:  Family History  Problem Relation Age  of Onset  . Heart attack Father    Social History:   Social History  Substance Use Topics  . Smoking status: Never Smoker   . Smokeless tobacco: Never Used  . Alcohol Use: No   Review of Systems:  Review of Systems  Constitutional: Positive for malaise/fatigue. Negative for fever, chills, weight loss and diaphoresis.  HENT: Positive for hearing loss. Negative for congestion, ear discharge, ear pain, nosebleeds, sore throat and tinnitus.   Eyes: Negative.   Respiratory: Negative for cough, hemoptysis, sputum production, shortness of breath, wheezing and stridor.   Cardiovascular: Negative.  Negative for chest pain, claudication and leg swelling.  Gastrointestinal: Negative.   Genitourinary: Positive for frequency. Negative for dysuria, urgency, hematuria and flank pain.       + incontinence  Musculoskeletal: Positive for myalgias (legs occ).  Negative for back pain, joint pain, falls and neck pain.  Skin: Positive for rash (on face from CPAP). Negative for itching.  Neurological: Negative.  Negative for weakness and headaches.  Psychiatric/Behavioral: Positive for memory loss. Negative for depression, suicidal ideas, hallucinations and substance abuse. The patient is not nervous/anxious and does not have insomnia.    Physical Exam: Estimated body mass index is 26.83 kg/(m^2) as calculated from the following:   Height as of this encounter: 5\' 8"  (1.727 m).   Weight as of this encounter: 176 lb 6.4 oz (80.015 kg). BP 110/80 mmHg  Pulse 92  Temp(Src) 97.3 F (36.3 C) (Temporal)  Resp 14  Ht 5\' 8"  (1.727 m)  Wt 176 lb 6.4 oz (80.015 kg)  BMI 26.83 kg/m2  SpO2 98% General Appearance: Well nourished, in no apparent distress. Eyes: PERRLA, EOMs, conjunctiva no swelling or erythema, normal fundi and vessels. Sinuses: No Frontal/maxillary tenderness ENT/Mouth: Ext aud canals clear, normal light reflex with TMs without erythema, bulging.Marland Kitchen No erythema, swelling, or exudate on post pharynx. Tonsils not swollen or erythematous. Hearing decreased, he has hearing aids Neck: Supple, thyroid normal. No bruits Respiratory: Respiratory effort normal, BS equal bilaterally without rales, rhonchi, wheezing or stridor. Cardio:Irreg, irreg with 2/6 systolic murmur without rubs or gallops. Brisk peripheral pulses without edema.  Chest: symmetric, with normal excursions and percussion. Abdomen: Soft, +BS. Non tender, no guarding, rebound, hernias, masses, or organomegaly. .  Lymphatics: Non tender without lymphadenopathy.  Genitourinary: defer Musculoskeletal: Full ROM all peripheral extremities,4/5 strength Skin: Warm, dry without ecchymosis.  Neuro: Cranial nerves intact, reflexes equal bilaterally. Normal muscle tone, no cerebellar symptoms. Antalgic/shuffling gait Psych: Awake and oriented X 3, normal affect.   EKG: declines, sees cardio  regularly and has pacemaker  Vicie Mutters 10:24 AM [facility]

## 2015-12-24 ENCOUNTER — Other Ambulatory Visit: Payer: Self-pay | Admitting: *Deleted

## 2015-12-24 LAB — MICROALBUMIN / CREATININE URINE RATIO
CREATININE, URINE: 285 mg/dL (ref 20–370)
MICROALB UR: 1.5 mg/dL
Microalb Creat Ratio: 5 mcg/mg creat (ref <30)

## 2015-12-24 MED ORDER — LEVOTHYROXINE SODIUM 50 MCG PO TABS: 50.0000 ug | ORAL_TABLET | Freq: Every day | ORAL | Status: DC

## 2016-01-08 DIAGNOSIS — Z950 Presence of cardiac pacemaker: Secondary | ICD-10-CM | POA: Diagnosis not present

## 2016-01-08 DIAGNOSIS — I480 Paroxysmal atrial fibrillation: Secondary | ICD-10-CM | POA: Diagnosis not present

## 2016-01-08 DIAGNOSIS — Z8673 Personal history of transient ischemic attack (TIA), and cerebral infarction without residual deficits: Secondary | ICD-10-CM | POA: Diagnosis not present

## 2016-01-08 DIAGNOSIS — I951 Orthostatic hypotension: Secondary | ICD-10-CM | POA: Diagnosis not present

## 2016-01-08 DIAGNOSIS — E784 Other hyperlipidemia: Secondary | ICD-10-CM | POA: Diagnosis not present

## 2016-01-08 DIAGNOSIS — Z7901 Long term (current) use of anticoagulants: Secondary | ICD-10-CM | POA: Diagnosis not present

## 2016-01-08 DIAGNOSIS — E039 Hypothyroidism, unspecified: Secondary | ICD-10-CM | POA: Diagnosis not present

## 2016-01-08 DIAGNOSIS — G4733 Obstructive sleep apnea (adult) (pediatric): Secondary | ICD-10-CM | POA: Diagnosis not present

## 2016-01-20 DIAGNOSIS — H524 Presbyopia: Secondary | ICD-10-CM | POA: Diagnosis not present

## 2016-01-20 DIAGNOSIS — H04129 Dry eye syndrome of unspecified lacrimal gland: Secondary | ICD-10-CM | POA: Diagnosis not present

## 2016-01-20 DIAGNOSIS — H538 Other visual disturbances: Secondary | ICD-10-CM | POA: Diagnosis not present

## 2016-01-20 DIAGNOSIS — I639 Cerebral infarction, unspecified: Secondary | ICD-10-CM | POA: Diagnosis not present

## 2016-01-27 ENCOUNTER — Inpatient Hospital Stay (HOSPITAL_COMMUNITY)
Admission: EM | Admit: 2016-01-27 | Discharge: 2016-01-31 | DRG: 291 | Disposition: A | Discharge status: Home Health | Payer: Medicare Other | Attending: Cardiology | Admitting: Cardiology

## 2016-01-27 ENCOUNTER — Encounter (HOSPITAL_COMMUNITY): Payer: Self-pay | Admitting: Emergency Medicine

## 2016-01-27 DIAGNOSIS — J440 Chronic obstructive pulmonary disease with acute lower respiratory infection: Secondary | ICD-10-CM | POA: Diagnosis present

## 2016-01-27 DIAGNOSIS — I480 Paroxysmal atrial fibrillation: Secondary | ICD-10-CM | POA: Diagnosis present

## 2016-01-27 DIAGNOSIS — Z79899 Other long term (current) drug therapy: Secondary | ICD-10-CM

## 2016-01-27 DIAGNOSIS — Z7901 Long term (current) use of anticoagulants: Secondary | ICD-10-CM

## 2016-01-27 DIAGNOSIS — R000 Tachycardia, unspecified: Secondary | ICD-10-CM | POA: Diagnosis not present

## 2016-01-27 DIAGNOSIS — G2581 Restless legs syndrome: Secondary | ICD-10-CM | POA: Diagnosis present

## 2016-01-27 DIAGNOSIS — E785 Hyperlipidemia, unspecified: Secondary | ICD-10-CM | POA: Diagnosis present

## 2016-01-27 DIAGNOSIS — I5041 Acute combined systolic (congestive) and diastolic (congestive) heart failure: Principal | ICD-10-CM | POA: Diagnosis present

## 2016-01-27 DIAGNOSIS — I5042 Chronic combined systolic (congestive) and diastolic (congestive) heart failure: Secondary | ICD-10-CM | POA: Diagnosis present

## 2016-01-27 DIAGNOSIS — I509 Heart failure, unspecified: Secondary | ICD-10-CM

## 2016-01-27 DIAGNOSIS — I700 Atherosclerosis of aorta: Secondary | ICD-10-CM | POA: Diagnosis present

## 2016-01-27 DIAGNOSIS — R7303 Prediabetes: Secondary | ICD-10-CM | POA: Diagnosis present

## 2016-01-27 DIAGNOSIS — Z8249 Family history of ischemic heart disease and other diseases of the circulatory system: Secondary | ICD-10-CM

## 2016-01-27 DIAGNOSIS — I495 Sick sinus syndrome: Secondary | ICD-10-CM | POA: Diagnosis not present

## 2016-01-27 DIAGNOSIS — K5900 Constipation, unspecified: Secondary | ICD-10-CM | POA: Diagnosis present

## 2016-01-27 DIAGNOSIS — G4733 Obstructive sleep apnea (adult) (pediatric): Secondary | ICD-10-CM | POA: Diagnosis present

## 2016-01-27 DIAGNOSIS — Z8673 Personal history of transient ischemic attack (TIA), and cerebral infarction without residual deficits: Secondary | ICD-10-CM

## 2016-01-27 DIAGNOSIS — Z950 Presence of cardiac pacemaker: Secondary | ICD-10-CM

## 2016-01-27 DIAGNOSIS — Z91030 Bee allergy status: Secondary | ICD-10-CM

## 2016-01-27 DIAGNOSIS — R197 Diarrhea, unspecified: Secondary | ICD-10-CM | POA: Diagnosis present

## 2016-01-27 DIAGNOSIS — J189 Pneumonia, unspecified organism: Secondary | ICD-10-CM | POA: Diagnosis present

## 2016-01-27 DIAGNOSIS — J9811 Atelectasis: Secondary | ICD-10-CM | POA: Diagnosis present

## 2016-01-27 DIAGNOSIS — F0390 Unspecified dementia without behavioral disturbance: Secondary | ICD-10-CM | POA: Diagnosis not present

## 2016-01-27 DIAGNOSIS — E559 Vitamin D deficiency, unspecified: Secondary | ICD-10-CM | POA: Diagnosis present

## 2016-01-27 DIAGNOSIS — I10 Essential (primary) hypertension: Secondary | ICD-10-CM | POA: Diagnosis present

## 2016-01-27 DIAGNOSIS — R05 Cough: Secondary | ICD-10-CM | POA: Diagnosis not present

## 2016-01-27 HISTORY — DX: Hypothyroidism, unspecified: E03.9

## 2016-01-27 HISTORY — DX: Basal cell carcinoma of skin of unspecified parts of face: C44.310

## 2016-01-27 HISTORY — DX: Pneumonia, unspecified organism: J18.9

## 2016-01-27 HISTORY — DX: Dependence on other enabling machines and devices: Z99.89

## 2016-01-27 HISTORY — DX: Chronic sinusitis, unspecified: J32.9

## 2016-01-27 HISTORY — DX: Personal history of transient ischemic attack (TIA), and cerebral infarction without residual deficits: Z86.73

## 2016-01-27 HISTORY — DX: Obstructive sleep apnea (adult) (pediatric): G47.33

## 2016-01-27 LAB — I-STAT TROPONIN, ED: Troponin i, poc: 0.01 ng/mL (ref 0.00–0.08)

## 2016-01-27 LAB — BASIC METABOLIC PANEL
ANION GAP: 12 (ref 5–15)
BUN: 21 mg/dL — ABNORMAL HIGH (ref 6–20)
CHLORIDE: 109 mmol/L (ref 101–111)
CO2: 19 mmol/L — ABNORMAL LOW (ref 22–32)
CREATININE: 1.01 mg/dL (ref 0.61–1.24)
Calcium: 9.2 mg/dL (ref 8.9–10.3)
GFR calc non Af Amer: >60 mL/min (ref >60)
Glucose, Bld: 109 mg/dL — ABNORMAL HIGH (ref 65–99)
POTASSIUM: 4.6 mmol/L (ref 3.5–5.1)
SODIUM: 140 mmol/L (ref 135–145)

## 2016-01-27 LAB — CBC
HEMATOCRIT: 39.4 % (ref 39.0–52.0)
HEMOGLOBIN: 12.9 g/dL — AB (ref 13.0–17.0)
MCH: 29.9 pg (ref 26.0–34.0)
MCHC: 32.7 g/dL (ref 30.0–36.0)
MCV: 91.4 fL (ref 78.0–100.0)
PLATELETS: 214 10*3/uL (ref 150–400)
RBC: 4.31 MIL/uL (ref 4.22–5.81)
RDW: 13.5 % (ref 11.5–15.5)
WBC: 7.4 10*3/uL (ref 4.0–10.5)

## 2016-01-27 LAB — PROTIME-INR
INR: 4.17 — AB (ref 0.00–1.49)
PROTHROMBIN TIME: 39.2 s — AB (ref 11.6–15.2)

## 2016-01-27 MED ORDER — DILTIAZEM HCL 25 MG/5ML IV SOLN
20.0000 mg | Freq: Once | INTRAVENOUS | Status: AC
  Administered 2016-01-27: 10 mg via INTRAVENOUS
  Filled 2016-01-27: qty 5

## 2016-01-27 MED ORDER — SODIUM CHLORIDE 0.9 % IV BOLUS (SEPSIS)
1000.0000 mL | Freq: Once | INTRAVENOUS | Status: AC
  Administered 2016-01-27: 1000 mL via INTRAVENOUS

## 2016-01-27 NOTE — ED Notes (Signed)
Dr. Oni at bedside at this time.  

## 2016-01-27 NOTE — ED Notes (Signed)
Pt. reports worsening SOB with productive cough onset 3 weeks ago , denies fever or chills , denies chest pain , history of Afib / Pacemaker, his cardiologist is [name].

## 2016-01-27 NOTE — ED Provider Notes (Signed)
CSN: [id number]     Arrival date & time 01/27/16  2243 History  By signing my name below, I, Stephania Fragmin, attest that this documentation has been prepared under the direction and in the presence of Everlene Balls, MD. Electronically Signed: Stephania Fragmin, ED Scribe. 01/27/2016. 2:33 AM.     Chief Complaint  Patient presents with  . Shortness of Breath   The history is provided by the patient. No language interpreter was used.   HPI Comments: Paul Davies is a 82 y.o. male with a history of COPD, pacemaker medtronic, CVA x 2, atrial fibrillation, hyperlipidemia, and hypertension, who presents to the Emergency Department complaining of gradual-onset, constant, worsening SOB that began 3 weeks ago. His wife states he has a chronic cough, but about 3 weeks ago, he has started to cough up fluid. His family notes he was told he had sick sinus syndrome but was advised not to have surgical intervention at this time. Associated symptoms include leg swelling, leg pain, and occasional generalized weakness. His daughter states he has a Coumadin check every 2 weeks. Patient denies any fever.  Cardiologist: [name]  Past Medical History  Diagnosis Date  . Stroke [facility])     twice  . Hyperlipidemia   . Hypertension   . OSA (obstructive sleep apnea)   . RLS (restless legs syndrome)   . Osteopenia   . Thyroid disease   . Vitamin D deficiency   . Pacemaker medtronic   . DJD (degenerative joint disease)   . Atherosclerosis of aorta ([place]) 2010    noted on lumbar CT in 2010  . COPD (chronic obstructive pulmonary disease) ([place])     On CXR  . Sinoatrial node dysfunction (HCC)   . Prediabetes   . Atrial fibrillation [facility])    Past Surgical History  Procedure Laterality Date  . Pacemaker insertion    . Pacemaker generator change N/A 05/04/2014    Procedure: PACEMAKER GENERATOR CHANGE;  Surgeon: [name], MD;  Location: [facility] CATH LAB;  Service: Cardiovascular;  Laterality: N/A;   Family History   Problem Relation Age of Onset  . Heart attack Father    Social History  Substance Use Topics  . Smoking status: Never Smoker   . Smokeless tobacco: Never Used  . Alcohol Use: No    Review of Systems A complete 10 system review of systems was obtained and all systems are negative except as noted in the HPI and PMH.    Allergies  Bee venom  Home Medications   Prior to Admission medications   Medication Sig Start Date End Date Taking? Authorizing Provider  cholecalciferol (VITAMIN D) 1000 UNITS tablet Take 2,000 Units by mouth daily.     Historical Provider, MD  CIALIS 20 MG tablet  09/26/15   Historical Provider, MD  hydrocortisone 2.5 % cream Apply 1 application topically daily. To face    Historical Provider, MD  ketoconazole (NIZORAL) 2 % cream Apply 1 application topically 2 (two) times daily as needed for irritation. 12/19/14   Vicie Mutters, PA-C  levothyroxine (SYNTHROID, LEVOTHROID) 50 MCG tablet Take 1 tablet (50 mcg total) by mouth daily before breakfast. 12/24/15   Unk Pinto, MD  NAMENDA XR 28 MG CP24 24 hr capsule Take 1 capsule (28 mg total) by mouth daily. 09/09/15   Dennie Bible, NP  tamsulosin (FLOMAX) 0.4 MG CAPS capsule Take 1 capsule (0.4 mg total) by mouth daily. 01/01/15   Unk Pinto, MD  warfarin (COUMADIN) 6  MG tablet Take 3-9 mg by mouth daily. Take 1.5 tablets every Monday, Wednesday, and Friday. Take 0.5 tablet every Tuesday, Thursday, Saturday and Sunday.    Historical Provider, MD   BP 145/113 mmHg  Pulse 163  Temp(Src) 97.8 F (36.6 C) (Oral)  Resp 24  SpO2 99% Physical Exam  Constitutional: He is oriented to person, place, and time. Vital signs are normal. He appears well-developed and well-nourished.  Non-toxic appearance. He does not appear ill. No distress.  HENT:  Head: Normocephalic and atraumatic.  Nose: Nose normal.  Mouth/Throat: Oropharynx is clear and moist. No oropharyngeal exudate.  Eyes: Conjunctivae and EOM are  normal. Pupils are equal, round, and reactive to light. No scleral icterus.  Neck: Normal range of motion. Neck supple. No tracheal deviation, no edema, no erythema and normal range of motion present. No thyroid mass and no thyromegaly present.  Cardiovascular: S1 normal, S2 normal, normal heart sounds, intact distal pulses and normal pulses.  An irregularly irregular rhythm present. Tachycardia present.  Exam reveals no gallop and no friction rub.   No murmur heard. Irregularly irregular. Tachycardia.   Pulmonary/Chest: Effort normal and breath sounds normal. No respiratory distress. He has no wheezes. He has no rhonchi. He has no rales.  Abdominal: Soft. Normal appearance and bowel sounds are normal. He exhibits no distension, no ascites and no mass. There is no hepatosplenomegaly. There is no tenderness. There is no rebound, no guarding and no CVA tenderness.  Musculoskeletal: Normal range of motion. He exhibits edema. He exhibits no tenderness.  1+ BLE edema to the knees.   Lymphadenopathy:    He has no cervical adenopathy.  Neurological: He is alert and oriented to person, place, and time. He has normal strength. No cranial nerve deficit or sensory deficit.  Skin: Skin is warm, dry and intact. No petechiae and no rash noted. He is not diaphoretic. No erythema. No pallor.  Psychiatric: He has a normal mood and affect. His behavior is normal. Judgment normal.  Nursing note and vitals reviewed.   ED Course  Procedures (including critical care time)  DIAGNOSTIC STUDIES: Oxygen Saturation is 99% on RA, normal by my interpretation.    COORDINATION OF CARE: 11:27 PM - Discussed treatment plan with pt and his family at bedside. Pt and his family verbalized understanding and agreed to plan.   Labs Review Labs Reviewed  BASIC METABOLIC PANEL - Abnormal; Notable for the following:    CO2 19 (*)    Glucose, Bld 109 (*)    BUN 21 (*)    All other components within normal limits  CBC -  Abnormal; Notable for the following:    Hemoglobin 12.9 (*)    All other components within normal limits  PROTIME-INR - Abnormal; Notable for the following:    Prothrombin Time 39.2 (*)    INR 4.17 (*)    All other components within normal limits  URINALYSIS, ROUTINE W REFLEX MICROSCOPIC (NOT AT [facility]) - Abnormal; Notable for the following:    APPearance CLOUDY (*)    Hgb urine dipstick MODERATE (*)    All other components within normal limits  BRAIN NATRIURETIC PEPTIDE - Abnormal; Notable for the following:    B Natriuretic Peptide 454.3 (*)    All other components within normal limits  URINE MICROSCOPIC-ADD ON - Abnormal; Notable for the following:    Squamous Epithelial / LPF 0-5 (*)    Bacteria, UA FEW (*)    All other components within normal limits  CULTURE, BLOOD (ROUTINE X 2)  CULTURE, BLOOD (ROUTINE X 2)  MAGNESIUM  TSH  BASIC METABOLIC PANEL  I-STAT TROPOININ, ED    Imaging Review Dg Chest 2 View  01/28/2016  CLINICAL DATA:  Cough and congestion for couple of weeks. Shortness of breath. EXAM: CHEST  2 VIEW COMPARISON:  12/23/2015 FINDINGS: Cardiac pacemaker. Normal heart size and pulmonary vascularity. Small bilateral pleural effusions with basilar atelectasis, greater on the right. Findings may indicate bilateral basilar pneumonia. No pneumothorax. Mediastinal contours appear intact. IMPRESSION: Bilateral pleural effusions with basilar atelectasis or consolidation. Findings may indicate bilateral pneumonia. Electronically Signed   By: Lucienne Capers M.D.   On: 01/28/2016 01:28   I have personally reviewed and evaluated these images and lab results as part of my medical decision-making.   EKG Interpretation   Date/Time:  Monday January 27 2016 23:58:58 EST Ventricular Rate:  90 PR Interval:  134 QRS Duration: 95 QT Interval:  382 QTC Calculation: 467 R Axis:   89 Text Interpretation:  Atrial fibrillation Borderline right axis deviation  RVR has resolved Confirmed by  Glynn Octave [id number]) on 01/28/2016  12:11:13 AM Also confirmed by Glynn Octave [phone]), editor  [place]  CCT, [place] ([id number])  on 01/28/2016 6:48:31 AM     CRITICAL CARE Performed by: Everlene Balls, MD Total critical care time: 45 minutes Critical care time was exclusive of separately billable procedures and treating other patients. Critical care was necessary to treat or prevent imminent or life-threatening deterioration. Critical care was time spent personally by me on the following activities: development of treatment plan with patient and/or surrogate as well as nursing, discussions with consultants, evaluation of patient's response to treatment, examination of patient, obtaining history from patient or surrogate, ordering and performing treatments and interventions, ordering and review of laboratory studies, ordering and review of radiographic studies, pulse oximetry and re-evaluation of patient's condition.   MDM   Final diagnoses:  None   Patient presents to the ED for SOB and worsening productive cough. HR is between 130-160.  Appears to be a fib on EKG however he has a h/o sick sinus syndrome as well.  He only takes coumadin and no antiarrhythmics.  He was given diltiazem 10mg  and IVF.  Will obtain infectious work up as well.  Plan to admit for further care.  CXR reveals possible pneumonia.  Cultures drawn and patient given ceftriaxone and azithromycin.  Dr. Percival Spanish is evaluated the patient for admission.     I personally performed the services described in this documentation, which was scribed in my presence. The recorded information has been reviewed and is accurate.      Everlene Balls, MD 01/28/16 [phone]

## 2016-01-28 ENCOUNTER — Encounter (HOSPITAL_COMMUNITY): Payer: Self-pay | Admitting: Cardiology

## 2016-01-28 ENCOUNTER — Emergency Department (HOSPITAL_COMMUNITY): Payer: Medicare Other

## 2016-01-28 DIAGNOSIS — E559 Vitamin D deficiency, unspecified: Secondary | ICD-10-CM | POA: Diagnosis present

## 2016-01-28 DIAGNOSIS — E785 Hyperlipidemia, unspecified: Secondary | ICD-10-CM | POA: Diagnosis present

## 2016-01-28 DIAGNOSIS — I5041 Acute combined systolic (congestive) and diastolic (congestive) heart failure: Secondary | ICD-10-CM | POA: Diagnosis present

## 2016-01-28 DIAGNOSIS — I10 Essential (primary) hypertension: Secondary | ICD-10-CM | POA: Diagnosis present

## 2016-01-28 DIAGNOSIS — F0390 Unspecified dementia without behavioral disturbance: Secondary | ICD-10-CM | POA: Diagnosis present

## 2016-01-28 DIAGNOSIS — Z8249 Family history of ischemic heart disease and other diseases of the circulatory system: Secondary | ICD-10-CM | POA: Diagnosis not present

## 2016-01-28 DIAGNOSIS — R7303 Prediabetes: Secondary | ICD-10-CM | POA: Diagnosis present

## 2016-01-28 DIAGNOSIS — Z91030 Bee allergy status: Secondary | ICD-10-CM | POA: Diagnosis not present

## 2016-01-28 DIAGNOSIS — K5900 Constipation, unspecified: Secondary | ICD-10-CM | POA: Diagnosis present

## 2016-01-28 DIAGNOSIS — Z7901 Long term (current) use of anticoagulants: Secondary | ICD-10-CM | POA: Diagnosis not present

## 2016-01-28 DIAGNOSIS — J189 Pneumonia, unspecified organism: Secondary | ICD-10-CM

## 2016-01-28 DIAGNOSIS — Z950 Presence of cardiac pacemaker: Secondary | ICD-10-CM | POA: Diagnosis not present

## 2016-01-28 DIAGNOSIS — I700 Atherosclerosis of aorta: Secondary | ICD-10-CM | POA: Diagnosis present

## 2016-01-28 DIAGNOSIS — I495 Sick sinus syndrome: Secondary | ICD-10-CM | POA: Diagnosis not present

## 2016-01-28 DIAGNOSIS — Z79899 Other long term (current) drug therapy: Secondary | ICD-10-CM | POA: Diagnosis not present

## 2016-01-28 DIAGNOSIS — I480 Paroxysmal atrial fibrillation: Secondary | ICD-10-CM | POA: Diagnosis present

## 2016-01-28 DIAGNOSIS — I509 Heart failure, unspecified: Secondary | ICD-10-CM | POA: Diagnosis not present

## 2016-01-28 DIAGNOSIS — R197 Diarrhea, unspecified: Secondary | ICD-10-CM | POA: Diagnosis present

## 2016-01-28 DIAGNOSIS — G2581 Restless legs syndrome: Secondary | ICD-10-CM | POA: Diagnosis present

## 2016-01-28 DIAGNOSIS — I4891 Unspecified atrial fibrillation: Secondary | ICD-10-CM

## 2016-01-28 DIAGNOSIS — Z8673 Personal history of transient ischemic attack (TIA), and cerebral infarction without residual deficits: Secondary | ICD-10-CM | POA: Diagnosis not present

## 2016-01-28 DIAGNOSIS — J9811 Atelectasis: Secondary | ICD-10-CM | POA: Diagnosis present

## 2016-01-28 DIAGNOSIS — G4733 Obstructive sleep apnea (adult) (pediatric): Secondary | ICD-10-CM | POA: Diagnosis present

## 2016-01-28 DIAGNOSIS — R05 Cough: Secondary | ICD-10-CM | POA: Diagnosis not present

## 2016-01-28 DIAGNOSIS — J440 Chronic obstructive pulmonary disease with acute lower respiratory infection: Secondary | ICD-10-CM | POA: Diagnosis present

## 2016-01-28 DIAGNOSIS — I5021 Acute systolic (congestive) heart failure: Secondary | ICD-10-CM | POA: Diagnosis not present

## 2016-01-28 HISTORY — DX: Pneumonia, unspecified organism: J18.9

## 2016-01-28 LAB — COMPREHENSIVE METABOLIC PANEL
ALT: 28 U/L (ref 17–63)
ANION GAP: 9 (ref 5–15)
AST: 26 U/L (ref 15–41)
Albumin: 3.3 g/dL — ABNORMAL LOW (ref 3.5–5.0)
Alkaline Phosphatase: 54 U/L (ref 38–126)
BUN: 17 mg/dL (ref 6–20)
CALCIUM: 8.8 mg/dL — AB (ref 8.9–10.3)
CO2: 20 mmol/L — ABNORMAL LOW (ref 22–32)
CREATININE: 1.1 mg/dL (ref 0.61–1.24)
Chloride: 110 mmol/L (ref 101–111)
Glucose, Bld: 142 mg/dL — ABNORMAL HIGH (ref 65–99)
Potassium: 4.4 mmol/L (ref 3.5–5.1)
Sodium: 139 mmol/L (ref 135–145)
Total Bilirubin: 0.6 mg/dL (ref 0.3–1.2)
Total Protein: 5.9 g/dL — ABNORMAL LOW (ref 6.5–8.1)

## 2016-01-28 LAB — BASIC METABOLIC PANEL
Anion gap: 9 (ref 5–15)
BUN: 17 mg/dL (ref 6–20)
CALCIUM: 8.8 mg/dL — AB (ref 8.9–10.3)
CO2: 18 mmol/L — ABNORMAL LOW (ref 22–32)
CREATININE: 0.9 mg/dL (ref 0.61–1.24)
Chloride: 111 mmol/L (ref 101–111)
Glucose, Bld: 115 mg/dL — ABNORMAL HIGH (ref 65–99)
Potassium: 4.2 mmol/L (ref 3.5–5.1)
SODIUM: 138 mmol/L (ref 135–145)

## 2016-01-28 LAB — URINALYSIS, ROUTINE W REFLEX MICROSCOPIC
BILIRUBIN URINE: NEGATIVE
Glucose, UA: NEGATIVE mg/dL
KETONES UR: NEGATIVE mg/dL
Leukocytes, UA: NEGATIVE
Nitrite: NEGATIVE
Protein, ur: NEGATIVE mg/dL
SPECIFIC GRAVITY, URINE: 1.029 (ref 1.005–1.030)
pH: 5 (ref 5.0–8.0)

## 2016-01-28 LAB — BRAIN NATRIURETIC PEPTIDE: B Natriuretic Peptide: 454.3 pg/mL — ABNORMAL HIGH (ref 0.0–100.0)

## 2016-01-28 LAB — TSH
TSH: 4.943 u[IU]/mL — AB (ref 0.350–4.500)
TSH: 4.212 u[IU]/mL (ref 0.350–4.500)

## 2016-01-28 LAB — URINE MICROSCOPIC-ADD ON

## 2016-01-28 LAB — MAGNESIUM: MAGNESIUM: 2 mg/dL (ref 1.7–2.4)

## 2016-01-28 MED ORDER — FUROSEMIDE 10 MG/ML IJ SOLN
20.0000 mg | Freq: Two times a day (BID) | INTRAMUSCULAR | Status: DC
  Administered 2016-01-28 – 2016-01-29 (×4): 20 mg via INTRAVENOUS
  Filled 2016-01-28 (×5): qty 2

## 2016-01-28 MED ORDER — LEVOTHYROXINE SODIUM 50 MCG PO TABS
50.0000 ug | ORAL_TABLET | Freq: Every day | ORAL | Status: DC
Start: 2016-01-28 — End: 2016-01-31
  Administered 2016-01-28 – 2016-01-31 (×4): 50 ug via ORAL
  Filled 2016-01-28 (×4): qty 1

## 2016-01-28 MED ORDER — DEXTROSE 5 % IV SOLN
2.0000 g | INTRAVENOUS | Status: DC
  Administered 2016-01-29 – 2016-01-31 (×3): 2 g via INTRAVENOUS
  Filled 2016-01-28 (×4): qty 2

## 2016-01-28 MED ORDER — TAMSULOSIN HCL 0.4 MG PO CAPS
0.4000 mg | ORAL_CAPSULE | Freq: Every day | ORAL | Status: DC
  Administered 2016-01-28 – 2016-01-31 (×4): 0.4 mg via ORAL
  Filled 2016-01-28 (×4): qty 1

## 2016-01-28 MED ORDER — AMIODARONE HCL IN DEXTROSE 360-4.14 MG/200ML-% IV SOLN
30.0000 mg/h | INTRAVENOUS | Status: DC
  Administered 2016-01-28 (×2): 30 mg/h via INTRAVENOUS
  Filled 2016-01-28 (×2): qty 200

## 2016-01-28 MED ORDER — AZITHROMYCIN 250 MG PO TABS
500.0000 mg | ORAL_TABLET | Freq: Once | ORAL | Status: AC
  Administered 2016-01-28: 500 mg via ORAL
  Filled 2016-01-28: qty 2

## 2016-01-28 MED ORDER — ACETAMINOPHEN 325 MG PO TABS
650.0000 mg | ORAL_TABLET | ORAL | Status: DC | PRN
  Administered 2016-01-29: 650 mg via ORAL
  Filled 2016-01-28: qty 2

## 2016-01-28 MED ORDER — AMIODARONE IV BOLUS ONLY 150 MG/100ML
150.0000 mg | Freq: Once | INTRAVENOUS | Status: AC
  Administered 2016-01-28: 150 mg via INTRAVENOUS
  Filled 2016-01-28: qty 100

## 2016-01-28 MED ORDER — AMIODARONE HCL IN DEXTROSE 360-4.14 MG/200ML-% IV SOLN
60.0000 mg/h | INTRAVENOUS | Status: AC
  Administered 2016-01-28: 60 mg/h via INTRAVENOUS
  Filled 2016-01-28: qty 200

## 2016-01-28 MED ORDER — MEMANTINE HCL ER 28 MG PO CP24
28.0000 mg | ORAL_CAPSULE | Freq: Every day | ORAL | Status: DC
  Administered 2016-01-28 – 2016-01-30 (×3): 28 mg via ORAL
  Filled 2016-01-28 (×7): qty 1

## 2016-01-28 MED ORDER — ONDANSETRON HCL 4 MG/2ML IJ SOLN: 4.0000 mg | Freq: Four times a day (QID) | INTRAMUSCULAR | Status: DC | PRN

## 2016-01-28 MED ORDER — WARFARIN - PHARMACIST DOSING INPATIENT: Freq: Every day | Status: DC

## 2016-01-28 MED ORDER — DEXTROSE 5 % IV SOLN
2.0000 g | Freq: Once | INTRAVENOUS | Status: AC
  Administered 2016-01-28: 2 g via INTRAVENOUS
  Filled 2016-01-28 (×2): qty 2

## 2016-01-28 MED ORDER — METOPROLOL TARTRATE 25 MG PO TABS
25.0000 mg | ORAL_TABLET | Freq: Two times a day (BID) | ORAL | Status: DC
  Administered 2016-01-28 (×2): 25 mg via ORAL
  Filled 2016-01-28 (×2): qty 1

## 2016-01-28 NOTE — Progress Notes (Signed)
ANTICOAGULATION CONSULT NOTE - Initial Consult  Pharmacy Consult for Coumadin Indication: atrial fibrillation  Allergies  Allergen Reactions  . Bee Venom Anaphylaxis    Patient Measurements:    Vital Signs: Temp: 97.8 F (36.6 C) (03/06 2307) Temp Source: Oral (03/06 2307) BP: 128/107 mmHg (03/07 0300) Pulse Rate: 145 (03/07 0206)  Labs:  Recent Labs  01/27/16 2306 01/27/16 2336  HGB 12.9*  --   HCT 39.4  --   PLT 214  --   LABPROT  --  39.2*  INR  --  4.17*  CREATININE 1.01  --     CrCl cannot be calculated (Unknown ideal weight.).   Medical History: Past Medical History  Diagnosis Date  . Stroke [facility])     twice  . Hyperlipidemia   . Hypertension   . OSA (obstructive sleep apnea)   . RLS (restless legs syndrome)   . Osteopenia   . Thyroid disease   . Vitamin D deficiency   . Pacemaker medtronic   . DJD (degenerative joint disease)   . Atherosclerosis of aorta ([place]) 2010    noted on lumbar CT in 2010  . COPD (chronic obstructive pulmonary disease) ([place])     On CXR  . Sinoatrial node dysfunction (HCC)   . Prediabetes   . Atrial fibrillation (HCC)     Medications:  Vit D  Synthroid  Namenda  FLomax Coumadin 6 mg daily  Assessment: 82 y.o. male with Afib to continue Coumadin.  INR supratherapeutic tonight  Goal of Therapy:  INR 2-3 Monitor platelets by anticoagulation protocol: Yes   Plan:  No Coumadin today  Daily INR   Adleigh Mcmasters, Bronson Curb 01/28/2016,4:19 AM

## 2016-01-28 NOTE — H&P (Signed)
CARDIOLOGY ADMISSION NOTE  Patient ID: Paul Davies MRN: [id number] DOB/AGE: [birth year] 82 y.o.  Admit date: 01/27/2016 Primary Physician   Alesia Richards, MD Primary Cardiologist   Dr. Andreas Blower Chief Complaint    Cough, SOB  HPI:  The patient presents with shortness of breath.    He has had cough over the past two weeks.  He says he is only here because his wife made him come.  His family reports that he has had increasing dyspnea.  He has had a cough and they hear his "throat rattling".  He does report that when he eats he has to cough.  He will cough up a white sputum.  He has never had this problem before.  The patient denies any new symptoms such as neck or arm discomfort. There has been no new PND or orthopnea. There have been no reported palpitations, presyncope or syncope.  He has had mild ankle edema   EKG demonstrates atrial fib with a rapid rate.  BNP was increased at 454.3.  POC troponin was negative.   CXR shows bilateral pleural effusions and is being read as possible pneumonia.  He denies any fevers or chills.    Past Medical History  Diagnosis Date  . Stroke [facility])     twice  . Hyperlipidemia   . Hypertension   . OSA (obstructive sleep apnea)   . RLS (restless legs syndrome)   . Osteopenia   . Thyroid disease   . Vitamin D deficiency   . Pacemaker medtronic   . DJD (degenerative joint disease)   . Atherosclerosis of aorta ([place]) 2010    noted on lumbar CT in 2010  . COPD (chronic obstructive pulmonary disease) ([place])     On CXR  . Sinoatrial node dysfunction (HCC)   . Prediabetes   . Atrial fibrillation [facility])     Past Surgical History  Procedure Laterality Date  . Pacemaker insertion    . Pacemaker generator change N/A 05/04/2014    Procedure: PACEMAKER GENERATOR CHANGE;  Surgeon: [name], MD;  Location: [facility] CATH LAB;  Service: Cardiovascular;  Laterality: N/A;    Allergies  Allergen Reactions  . Bee Venom Anaphylaxis   No current  facility-administered medications on file prior to encounter.   Current Outpatient Prescriptions on File Prior to Encounter  Medication Sig Dispense Refill  . cholecalciferol (VITAMIN D) 1000 UNITS tablet Take 2,000 Units by mouth daily.     . hydrocortisone 2.5 % cream Apply 1 application topically daily as needed (inflammation on face). To face    . ketoconazole (NIZORAL) 2 % cream Apply 1 application topically 2 (two) times daily as needed for irritation. 30 g 2  . levothyroxine (SYNTHROID, LEVOTHROID) 50 MCG tablet Take 1 tablet (50 mcg total) by mouth daily before breakfast. 90 tablet 1  . NAMENDA XR 28 MG CP24 24 hr capsule Take 1 capsule (28 mg total) by mouth daily. 90 capsule 1  . tamsulosin (FLOMAX) 0.4 MG CAPS capsule Take 1 capsule (0.4 mg total) by mouth daily. 90 capsule 3  . warfarin (COUMADIN) 6 MG tablet Take 6 mg by mouth daily.      Social History   Social History  . Marital Status: Married    Spouse Name: N/A  . Number of Children: 5  . Years of Education: 11   Occupational History  . Retired    Social History Main Topics  . Smoking status: Never Smoker   . Smokeless tobacco: Never  Used  . Alcohol Use: No  . Drug Use: No  . Sexual Activity: Not on file   Other Topics Concern  . Not on file   Social History Narrative   Patient lives at home with his wife    Patient drinks coffee daily    Family History  Problem Relation Age of Onset  . Heart attack Father   . Heart attack Brother 60     ROS:  Positive for constipation.  Otherwise as stated in the HPI and negative for all other systems.  Physical Exam: Blood pressure 119/101, pulse 145, temperature 97.8 F (36.6 C), temperature source Oral, resp. rate 20, SpO2 91 %.  GENERAL:  Well appearing HEENT:  Pupils equal round and reactive, fundi not visualized, oral mucosa unremarkable NECK:  No jugular venous distention, waveform within normal limits, carotid upstroke brisk and symmetric, no bruits, no  thyromegaly LYMPHATICS:  No cervical, inguinal adenopathy LUNGS:  Clear to auscultation bilaterally BACK:  No CVA tenderness CHEST:  Unremarkable HEART:  PMI not displaced or sustained,S1 and S2 within normal limits, no S3,  no clicks, no rubs, no murmurs, irregular ABD:  Flat, positive bowel sounds normal in frequency in pitch, no bruits, no rebound, no guarding, no midline pulsatile mass, no hepatomegaly, no splenomegaly EXT:  2 plus pulses throughout, trace ankle edema, no cyanosis no clubbing SKIN:  No rashes no nodules NEURO:  Cranial nerves II through XII grossly intact, motor grossly intact throughout PSYCH:  Cognitively intact, oriented to person place and time  Labs: Lab Results  Component Value Date   BUN 21* 01/27/2016   Lab Results  Component Value Date   CREATININE 1.01 01/27/2016   Lab Results  Component Value Date   NA 140 01/27/2016   K 4.6 01/27/2016   CL 109 01/27/2016   CO2 19* 01/27/2016   No results found for: TROPONINI Lab Results  Component Value Date   WBC 7.4 01/27/2016   HGB 12.9* 01/27/2016   HCT 39.4 01/27/2016   MCV 91.4 01/27/2016   PLT 214 01/27/2016    Radiology:  CXR:  Cardiac pacemaker. Normal heart size and pulmonary vascularity. Small bilateral pleural effusions with basilar atelectasis, greater on the right. Findings may indicate bilateral basilar pneumonia. No pneumothorax. Mediastinal contours appear intact.  EKG:  Atrial fib, rate 90.  No acute ST T wave changes.  01/28/2016  ASSESSMENT AND PLAN:    ATRIAL FIB:   I reviewed his pacemaker interrogation from last year.  He apparently is not chronically in atrial fib but does have paroxysms with rapid rate.  I suspect that atrial fib with RVR could be the primary event with subsequent CHF.  I will admit and give him some IV diuresis.  I will also start amiodarone IV for rate control and rhythm management.  He is on warfarin.  This will continue.  Check an echocardiogram.  DYSPNEA:   Acute heart failure (EF pending).  There might be a component of pneumonia although the presentation is not compelling for this.  He was given antibiotics in the emergency room and I will continue these.    SignedMinus Breeding 01/28/2016, 3:38 AM

## 2016-01-28 NOTE — ED Notes (Signed)
Pt reports feeling "tightness in my throat"; pt also reports that sometimes "I have to cough mucous up be I cant breathe"; O2 saturations WNL

## 2016-01-28 NOTE — Progress Notes (Signed)
Subjective:  Complained of several week history of some mild dyspnea although he is a somewhat poor historian.  Came in last night with coughing.  Found to be in rapid atrial fibrillation and was started on amiodarone.  He has been on long-term anticoagulation and his warfarin has been supratherapeutic on admission.  Wife states that he has been coughing chronically.  He says it is worse sometimes when he eats.  Objective:  Vital Signs in the last 24 hours: BP 124/101 mmHg  Pulse 150  Temp(Src) 97.8 F (36.6 C) (Oral)  Resp 19  SpO2 91%  Physical Exam: Elderly male in no acute distress Lungs:  Reduced breath sounds bases  Cardiac:  Irregular rhythm, normal S1 and S2, no S3, no murmur Extremities:  No edema present  Intake/Output from previous day:   Weight   Lab Results: Basic Metabolic Panel:  Recent Labs  01/27/16 2306 01/28/16 0727  NA 140 138  K 4.6 4.2  CL 109 111  CO2 19* 18*  GLUCOSE 109* 115*  BUN 21* 17  CREATININE 1.01 0.90    CBC:  Recent Labs  01/27/16 2306  WBC 7.4  HGB 12.9*  HCT 39.4  MCV 91.4  PLT 214    BNP    Component Value Date/Time   BNP 454.3* 01/27/2016 2306   Telemetry: Atrial fibrillation with somewhat rapid ventricular response  Assessment/Plan:  1.  Clinical picture is that of either bilateral pneumonia or bronchitis 2.  Possible mild diastolic heart failure with elevated BNP 3.  Paroxysmal atrial fibrillation 4.  Long-term use of anticoagulants  Recommendations:  Continue antibiotics.  Diuresis.  Check echocardiogram.  Add beta blocker to regimen.  Interrogate pacemaker     W. Tollie Eth, Brooke Bonito  MD [facility] Cardiology  01/28/2016, 8:29 AM

## 2016-01-29 ENCOUNTER — Inpatient Hospital Stay (HOSPITAL_COMMUNITY): Payer: Medicare Other

## 2016-01-29 LAB — ECHOCARDIOGRAM COMPLETE
HEIGHTINCHES: 69 in
WEIGHTICAEL: 2915.2 [oz_av]

## 2016-01-29 LAB — PROTIME-INR
INR: 3.77 — AB (ref 0.00–1.49)
PROTHROMBIN TIME: 36.4 s — AB (ref 11.6–15.2)

## 2016-01-29 LAB — MRSA PCR SCREENING: MRSA BY PCR: NEGATIVE

## 2016-01-29 MED ORDER — DIPHENHYDRAMINE HCL 25 MG PO CAPS
25.0000 mg | ORAL_CAPSULE | Freq: Once | ORAL | Status: AC
  Administered 2016-01-29: 25 mg via ORAL
  Filled 2016-01-29: qty 1

## 2016-01-29 MED ORDER — AMIODARONE HCL 200 MG PO TABS
400.0000 mg | ORAL_TABLET | Freq: Two times a day (BID) | ORAL | Status: DC
  Administered 2016-01-29 – 2016-01-30 (×4): 400 mg via ORAL
  Filled 2016-01-29 (×4): qty 2

## 2016-01-29 MED ORDER — METOPROLOL TARTRATE 50 MG PO TABS
50.0000 mg | ORAL_TABLET | Freq: Two times a day (BID) | ORAL | Status: DC
  Administered 2016-01-29 – 2016-01-30 (×4): 50 mg via ORAL
  Filled 2016-01-29 (×4): qty 1

## 2016-01-29 NOTE — Progress Notes (Addendum)
Pt experienced a fall, not witnessed by staff, but wife was in the room at the time. Pt was uninjured and did not hit his head during the fall. Pts bed alarm was on at the time of the fall, however fire alarm was activated at the same time, so patients room door was closed and staff's ability to hear bed alarm was limited by fire alarm. Pt and family educated on importance of notifying staff for a need to leave the bed. Post fall huddle and proper documentation was completed. Pt remains stable and uninjured and staff will continue to monitor pt closely.   [name] notified of patient's fall.  No new orders received.  Sanda Linger

## 2016-01-29 NOTE — Progress Notes (Signed)
Patient complained of multiple loose stools today.  Turner with Cardiology on call this evening and was updated, orders received.

## 2016-01-29 NOTE — Progress Notes (Signed)
  Echocardiogram 2D Echocardiogram has been performed.  Paul Davies 01/29/2016, 11:28 AM

## 2016-01-29 NOTE — Progress Notes (Signed)
Patient's wife called because patient was attempting to get out of bed.  RN entered room and patient's wife stated patient was looking for his pocket knife.  Patient then stated he thought his pocket knife had fallen out of his pocket and was underneath him.  Patient then [facility] self to end of bed and pulled up bed pad to look underneath it.  Patient then attempted to pull up fitted sheet and RN asked patient to stop, explaining that his pocket knife was not in the bed.  RN asked patient what he needed his pocket knife for and patient replied to cut all these things off (grabbing at his oxygen tubing, IV line and heart monitor leads).  Patient then attempting again to get out bed to look under bed for his pocket knife.  RN asked patient to stop, explaining again that his pocket knife was not here.  Patient was in the hospital at [facility].  RN explained to patient that the IV line was giving him medicine for his heart because his heart was out of rhythm.  Patient listened to RN and allowed RN to assist him back to the head of the bed and lay down.  Patient then became tearful stating he did not know where he was or what was going on and if he could just figure that out then maybe he could calm him self down.  RN explained to patient again where he was, why he was here and what the tubing/lines (IV, oxygen and heart monitor) attached to him was for.  Patient stated understanding.  RN asked patient if he felt nervous or anxious and patient stated yes and became tearful and again said if he could figure out what was going on he would be able to calm himself down and feel better.  RN once again explained to patient why he was here; that he was getting medicine through the tube/IV in his arm to help with his heart.  RN again explained to patient that his heart was being monitored at all times, that someone was watching what his heart was doing all the time.  Patient stated understanding, was no longer tearful.   During this interaction between RN and patient, patient asked if the oxygen could be taken off.  RN did so and explained to patient that if his oxygen dropped he would have to put it back on.  Patient stated understanding.  While patient's oxygen was off RN checked oxygen saturation and was 88-91% on room air.  RN explained to patient that his oxygen was a little bit low and he needed to wear the oxygen tubing so he could get extra oxygen.  Patient stated understanding and allowed RN to replace nasal cannula on patient.  Oxygen rechecked and was 92% on 2L.  Call light in reach, bed alarm on, wife and patient's son at bedside.  Cardiology paged to update.

## 2016-01-29 NOTE — Progress Notes (Signed)
Patient requesting something to help him sleep.  Per patient's wife patient has taken tylenol PM before.  Cardiology paged with this information, order received.

## 2016-01-29 NOTE — Progress Notes (Signed)
ANTICOAGULATION CONSULT NOTE - Follow Up Consult  Pharmacy Consult for Coumadin Indication: atrial fibrillation  Allergies  Allergen Reactions  . Bee Venom Anaphylaxis    Patient Measurements: Height: 5\' 9"  (175.3 cm) Weight: 182 lb 3.2 oz (82.645 kg) IBW/kg (Calculated) : 70.7  Vital Signs: Temp: 97.7 F (36.5 C) (03/08 0740) Temp Source: Oral (03/08 0740) BP: 114/87 mmHg (03/08 0740) Pulse Rate: 58 (03/08 0740)  Labs:  Recent Labs  01/27/16 2306 01/27/16 2336 01/28/16 0727 01/28/16 1126 01/29/16 0400  HGB 12.9*  --   --   --   --   HCT 39.4  --   --   --   --   PLT 214  --   --   --   --   LABPROT  --  39.2*  --   --  36.4*  INR  --  4.17*  --   --  3.77*  CREATININE 1.01  --  0.90 1.10  --     Estimated Creatinine Clearance: 50.9 mL/min (by C-G formula based on Cr of 1.1).   Assessment: Admitted with coughing/dyspnea found to be in rapid Afib, Coumadin to continue (INR 4.17 on admit)  IV Amiodarone started in ED (not on at home)  Anticoagulation  Coumadin for Afib, INR 4.17 on admit. New amiodarone IV; F/U if Amiodarone to continue po. INR 3.77. -warfarin 6mg /d PTA  Goal of Therapy:  INR 2-3 Monitor platelets by anticoagulation protocol: Yes   Plan:  Continue to hold Coumadin again today. Daily INR   Paul Davies S. Paul Davies, PharmD, BCPS Clinical Staff Pharmacist Pager [phone]  Paul Davies Stillinger 01/29/2016,8:49 AM

## 2016-01-29 NOTE — Progress Notes (Signed)
Subjective:  Patient became confused last night and had sundowning.  Less shortness of breath overnight.  Some diuresis overnight.  Weight is down 2 pounds from yesterday.  Renal function is stable.  Atrial fibrillation rate still increased.  Objective:  Vital Signs in the last 24 hours: BP 114/87 mmHg  Pulse 58  Temp(Src) 97.7 F (36.5 C) (Oral)  Resp 18  Ht 5\' 9"  (1.753 m)  Wt 82.645 kg (182 lb 3.2 oz)  BMI 26.89 kg/m2  SpO2 93%  Physical Exam: Elderly male in no acute distress Lungs:  Reduced breath sounds bases  Cardiac:  Irregular rhythm, normal S1 and S2, no S3, no murmur Extremities:  No edema present  Intake/Output from previous day: 03/07 0701 - 03/08 0700 In: 360 [P.O.:210; I.V.:100; IV Piggyback:50] Out: [id number] [Urine:1650] Weight Filed Weights   01/28/16 1650 01/29/16 0354  Weight: 83.507 kg (184 lb 1.6 oz) 82.645 kg (182 lb 3.2 oz)    Lab Results: Basic Metabolic Panel:  Recent Labs  01/28/16 0727 01/28/16 1126  NA 138 139  K 4.2 4.4  CL 111 110  CO2 18* 20*  GLUCOSE 115* 142*  BUN 17 17  CREATININE 0.90 1.10    CBC:  Recent Labs  01/27/16 2306  WBC 7.4  HGB 12.9*  HCT 39.4  MCV 91.4  PLT 214   This SmartLink has not been configured with any valid records.   Lab Results  Component Value Date   INR 3.77* 01/29/2016   INR 4.17* 01/27/2016   INR 2.69* 05/04/2014   BNP    Component Value Date/Time   BNP 454.3* 01/27/2016 2306   Telemetry: Atrial fibrillation with somewhat rapid ventricular response  Assessment/Plan:  1.  Clinical picture is that of either bilateral pneumonia or bronchitis 2.  Possible mild diastolic heart failure with elevated BNP 3.  Paroxysmal atrial fibrillation 4.  Long-term use of anticoagulants  Recommendations:  Continue diuresis and antibiotics.  Change amiodarone to oral.  Increase beta blocker because ventricular response is still increased.  Interrogate pacemaker about duration of atrial  fibrillation.  Await echocardiogram.      W. Doristine Church  MD [facility] Cardiology  01/29/2016, 9:00 AM

## 2016-01-29 NOTE — Plan of Care (Signed)
Problem: Education: Goal: Knowledge of [facility] Education information/materials will improve Outcome: Progressing RN has provided education to patient, patient's wife and patient's family at bedside pertaining to plan of care for patient.  Patient, patient's wife and present family state understanding.  RN provided patient and patient's wife with fall risk education.  Patient is a high fall risk per RN assessment.  Fall risk safety measures in place including bed alarm on.  RN has provided medication education to patient and patient's wife prior to administering all medications thus far this shift.  Patient and patient's wife stated understanding.  RN unsure how much information patient actually retains from education.  RN has noticed patient can be forgetfu, for example multiple times patient has asked RN her name thus far this shift.  Also earlier this shift, patient attempting to get out of bed multiple times without calling for staff due to needing to urinate, even though staff had asked patient to call and wait for assistance multiple times.

## 2016-01-29 NOTE — Progress Notes (Signed)
UR Completed Qunisha Bryk Graves-Bigelow, RN,BSN [phone]  

## 2016-01-30 ENCOUNTER — Encounter (HOSPITAL_COMMUNITY): Payer: Self-pay | Admitting: Cardiology

## 2016-01-30 ENCOUNTER — Inpatient Hospital Stay (HOSPITAL_COMMUNITY): Payer: Medicare Other

## 2016-01-30 ENCOUNTER — Other Ambulatory Visit (HOSPITAL_COMMUNITY): Payer: Medicare Other

## 2016-01-30 DIAGNOSIS — I5042 Chronic combined systolic (congestive) and diastolic (congestive) heart failure: Secondary | ICD-10-CM | POA: Diagnosis present

## 2016-01-30 DIAGNOSIS — I700 Atherosclerosis of aorta: Secondary | ICD-10-CM | POA: Diagnosis present

## 2016-01-30 LAB — BASIC METABOLIC PANEL
Anion gap: 11 (ref 5–15)
BUN: 18 mg/dL (ref 6–20)
CALCIUM: 8.9 mg/dL (ref 8.9–10.3)
CHLORIDE: 107 mmol/L (ref 101–111)
CO2: 22 mmol/L (ref 22–32)
CREATININE: 1.18 mg/dL (ref 0.61–1.24)
GFR calc non Af Amer: 55 mL/min — ABNORMAL LOW (ref >60)
Glucose, Bld: 105 mg/dL — ABNORMAL HIGH (ref 65–99)
Potassium: 3.7 mmol/L (ref 3.5–5.1)
SODIUM: 140 mmol/L (ref 135–145)

## 2016-01-30 LAB — C DIFFICILE QUICK SCREEN W PCR REFLEX
C DIFFICILE (CDIFF) INTERP: NEGATIVE
C Diff antigen: NEGATIVE
C Diff toxin: NEGATIVE

## 2016-01-30 LAB — PROTIME-INR
INR: 2.81 — ABNORMAL HIGH (ref 0.00–1.49)
PROTHROMBIN TIME: 29.2 s — AB (ref 11.6–15.2)

## 2016-01-30 MED ORDER — LISINOPRIL 10 MG PO TABS
10.0000 mg | ORAL_TABLET | Freq: Every day | ORAL | Status: DC
  Administered 2016-01-30 – 2016-01-31 (×2): 10 mg via ORAL
  Filled 2016-01-30 (×2): qty 1

## 2016-01-30 MED ORDER — FUROSEMIDE 20 MG PO TABS
20.0000 mg | ORAL_TABLET | Freq: Every day | ORAL | Status: DC
  Administered 2016-01-30: 20 mg via ORAL
  Filled 2016-01-30: qty 1

## 2016-01-30 MED ORDER — WARFARIN SODIUM 5 MG PO TABS
5.0000 mg | ORAL_TABLET | Freq: Every day | ORAL | Status: DC
  Administered 2016-01-30: 5 mg via ORAL
  Filled 2016-01-30: qty 1

## 2016-01-30 NOTE — Progress Notes (Signed)
ANTICOAGULATION CONSULT NOTE - Follow Up Consult  Pharmacy Consult for Coumadin Indication: atrial fibrillation  Allergies  Allergen Reactions  . Bee Venom Anaphylaxis    Patient Measurements: Height: 5\' 9"  (175.3 cm) Weight: 178 lb 1.6 oz (80.786 kg) IBW/kg (Calculated) : 70.7  Vital Signs: Temp: 97.6 F (36.4 C) (03/09 1130) Temp Source: Oral (03/09 1130) BP: 100/61 mmHg (03/09 1130) Pulse Rate: 64 (03/09 1130)  Labs:  Recent Labs  01/27/16 2306 01/27/16 2336 01/28/16 0727 01/28/16 1126 01/29/16 0400 01/30/16 0329  HGB 12.9*  --   --   --   --   --   HCT 39.4  --   --   --   --   --   PLT 214  --   --   --   --   --   LABPROT  --  39.2*  --   --  36.4* 29.2*  INR  --  4.17*  --   --  3.77* 2.81*  CREATININE 1.01  --  0.90 1.10  --  1.18    Estimated Creatinine Clearance: 47.4 mL/min (by C-G formula based on Cr of 1.18).   Assessment: Admitted with coughing/dyspnea found to be in rapid Afib, Coumadin to continue (INR 4.17 on admit)  Anticoagulation  Coumadin for Afib, INR 4.17 on admit. New amiodarone IV; F/U if Amiodarone to continue po. INR 2.81 down (watch for interaction with new amiodarone) -warfarin 6mg /d PTA  Goal of Therapy:  INR 2-3 Monitor platelets by anticoagulation protocol: Yes   Plan:  Resume Coumadin 5mg  po daily Daily INR   Paul Davies, PharmD, BCPS Clinical Staff Pharmacist Pager [phone]  Paul Davies 01/30/2016,12:07 PM

## 2016-01-30 NOTE — Progress Notes (Addendum)
Subjective:  Feeling better with less shortness of breath.  He converted to sinus rhythm yesterday around noon.  Has remained in sinus rhythm with atrial paced beats overnight.  Less shortness of breath and less cough the present time.  Oxygen saturations have been good and he has no fever.  Had mild diarrhea noted by the nurses.  He had one fall yesterday when he got out of the bed without calling the nurse but slipped on the floor with no apparent injury noted.  Objective:  Vital Signs in the last 24 hours: BP 119/77 mmHg  Pulse 69  Temp(Src) 97.7 F (36.5 C) (Oral)  Resp 14  Ht 5\' 9"  (1.753 m)  Wt 80.786 kg (178 lb 1.6 oz)  BMI 26.29 kg/m2  SpO2 93%  Physical Exam: Elderly male in no acute distress Lungs:  Reduced breath sounds bases  Cardiac: Regular rhythm, normal S1 and S2, no S3, no murmur Extremities:  No edema present  Intake/Output from previous day: 03/08 0701 - 03/09 0700 In: 490 [P.O.:440; IV Piggyback:50] Out: 550 [Urine:550] Weight Filed Weights   01/28/16 1650 01/29/16 0354 01/30/16 0209  Weight: 83.507 kg (184 lb 1.6 oz) 82.645 kg (182 lb 3.2 oz) 80.786 kg (178 lb 1.6 oz)    Lab Results: Basic Metabolic Panel:  Recent Labs  01/28/16 1126 01/30/16 0329  NA 139 140  K 4.4 3.7  CL 110 107  CO2 20* 22  GLUCOSE 142* 105*  BUN 17 18  CREATININE 1.10 1.18    CBC:  Recent Labs  01/27/16 2306  WBC 7.4  HGB 12.9*  HCT 39.4  MCV 91.4  PLT 214   This SmartLink has not been configured with any valid records.   Lab Results  Component Value Date   INR 2.81* 01/30/2016   INR 3.77* 01/29/2016   INR 4.17* 01/27/2016   BNP    Component Value Date/Time   BNP 454.3* 01/27/2016 2306   Telemetry: Atrial fibrillation with somewhat rapid ventricular response  Assessment/Plan:  1.  Clinical picture is that of either bilateral pneumonia or bronchitis 2.  Mild acute systolic and diastolic heart failure with elevated BNP 3.  Paroxysmal atrial  fibrillation-converted to sinus rhythm  4.  Long-term use of anticoagulants 5.  Diarrhea with negative C. difficile  Recommendations:  Echocardiogram showed a mildly dilated RV with questionable paradoxical septal motion.    his ejection fraction is mildly depressed.  Will add ACE inhibitor and continue beta blocker.  Continue amiodarone as well as anticoagulation.  Continue intravenous antibiotics one more day and then changeto oral.  Repeat chest x-ray today.  Possible home tomorrow or Saturday.   Kerry Hough  MD [facility] Cardiology  01/30/2016, 9:03 AM

## 2016-01-30 NOTE — Discharge Instructions (Signed)
Information on my medicine - Coumadin®   (Warfarin) ° °This medication education was reviewed with me or my healthcare representative as part of my discharge preparation.  The pharmacist that spoke with me during my hospital stay was:  Foxx Klarich Stillinger, RPH ° °Why was Coumadin prescribed for you? °Coumadin was prescribed for you because you have a blood clot or a medical condition that can cause an increased risk of forming blood clots. Blood clots can cause serious health problems by blocking the flow of blood to the heart, lung, or brain. Coumadin can prevent harmful blood clots from forming. °As a reminder your indication for Coumadin is:   Stroke Prevention Because Of Atrial Fibrillation ° °What test will check on my response to Coumadin? °While on Coumadin (warfarin) you will need to have an INR test regularly to ensure that your dose is keeping you in the desired range. The INR (international normalized ratio) number is calculated from the result of the laboratory test called prothrombin time (PT). ° °If an INR APPOINTMENT HAS NOT ALREADY BEEN MADE FOR YOU please schedule an appointment to have this lab work done by your health care provider within 7 days. °Your INR goal is usually a number between:  2 to 3 or your provider may give you a more narrow range like 2-2.5.  Ask your health care provider during an office visit what your goal INR is. ° °What  do you need to  know  About  COUMADIN? °Take Coumadin (warfarin) exactly as prescribed by your healthcare provider about the same time each day.  DO NOT stop taking without talking to the doctor who prescribed the medication.  Stopping without other blood clot prevention medication to take the place of Coumadin may increase your risk of developing a new clot or stroke.  Get refills before you run out. ° °What do you do if you miss a dose? °If you miss a dose, take it as soon as you remember on the same day then continue your regularly scheduled  regimen the next day.  Do not take two doses of Coumadin at the same time. ° °Important Safety Information °A possible side effect of Coumadin (Warfarin) is an increased risk of bleeding. You should call your healthcare provider right away if you experience any of the following: °  Bleeding from an injury or your nose that does not stop. °  Unusual colored urine (red or dark brown) or unusual colored stools (red or black). °  Unusual bruising for unknown reasons. °  A serious fall or if you hit your head (even if there is no bleeding). ° °Some foods or medicines interact with Coumadin® (warfarin) and might alter your response to warfarin. To help avoid this: °  Eat a balanced diet, maintaining a consistent amount of Vitamin K. °  Notify your provider about major diet changes you plan to make. °  Avoid alcohol or limit your intake to 1 drink for women and 2 drinks for men per day. °(1 drink is 5 oz. wine, 12 oz. beer, or 1.5 oz. liquor.) ° °Make sure that ANY health care provider who prescribes medication for you knows that you are taking Coumadin (warfarin).  Also make sure the healthcare provider who is monitoring your Coumadin knows when you have started a new medication including herbals and non-prescription products. ° °Coumadin® (Warfarin)  Major Drug Interactions  °Increased Warfarin Effect Decreased Warfarin Effect  °Alcohol (large quantities) °Antibiotics (esp. Septra/Bactrim, Flagyl, Cipro) °Amiodarone (Cordarone) °Aspirin (  ASA) °Cimetidine (Tagamet) °Megestrol (Megace) °NSAIDs (ibuprofen, naproxen, etc.) °Piroxicam (Feldene) °Propafenone (Rythmol SR) °Propranolol (Inderal) °Isoniazid (INH) °Posaconazole (Noxafil) Barbiturates (Phenobarbital) °Carbamazepine (Tegretol) °Chlordiazepoxide (Librium) °Cholestyramine (Questran) °Griseofulvin °Oral Contraceptives °Rifampin °Sucralfate (Carafate) °Vitamin K  ° °Coumadin® (Warfarin) Major Herbal Interactions  °Increased Warfarin Effect Decreased Warfarin Effect    °Garlic °Ginseng °Ginkgo biloba Coenzyme Q10 °Green tea °St. John’s wort   ° °Coumadin® (Warfarin) FOOD Interactions  °Eat a consistent number of servings per week of foods HIGH in Vitamin K °(1 serving = ½ cup)  °Collards (cooked, or boiled & drained) °Kale (cooked, or boiled & drained) °Mustard greens (cooked, or boiled & drained) °Parsley *serving size only = ¼ cup °Spinach (cooked, or boiled & drained) °Swiss chard (cooked, or boiled & drained) °Turnip greens (cooked, or boiled & drained)  °Eat a consistent number of servings per week of foods MEDIUM-HIGH in Vitamin K °(1 serving = 1 cup)  °Asparagus (cooked, or boiled & drained) °Broccoli (cooked, boiled & drained, or raw & chopped) °Brussel sprouts (cooked, or boiled & drained) *serving size only = ½ cup °Lettuce, raw (green leaf, endive, romaine) °Spinach, raw °Turnip greens, raw & chopped  ° °These websites have more information on Coumadin (warfarin):  www.coumadin.com; °www.ahrq.gov/consumer/coumadin.htm; ° ° ° °

## 2016-01-31 LAB — BASIC METABOLIC PANEL
Anion gap: 9 (ref 5–15)
BUN: 21 mg/dL — AB (ref 6–20)
CALCIUM: 8.6 mg/dL — AB (ref 8.9–10.3)
CO2: 22 mmol/L (ref 22–32)
CREATININE: 1.29 mg/dL — AB (ref 0.61–1.24)
Chloride: 108 mmol/L (ref 101–111)
GFR calc Af Amer: 57 mL/min — ABNORMAL LOW (ref >60)
GFR, EST NON AFRICAN AMERICAN: 50 mL/min — AB (ref >60)
GLUCOSE: 106 mg/dL — AB (ref 65–99)
Potassium: 4 mmol/L (ref 3.5–5.1)
SODIUM: 139 mmol/L (ref 135–145)

## 2016-01-31 LAB — PROTIME-INR
INR: 2.54 — AB (ref 0.00–1.49)
PROTHROMBIN TIME: 27 s — AB (ref 11.6–15.2)

## 2016-01-31 MED ORDER — AMIODARONE HCL 200 MG PO TABS: 200.0000 mg | ORAL_TABLET | Freq: Two times a day (BID) | ORAL | Status: DC

## 2016-01-31 MED ORDER — AZITHROMYCIN 250 MG PO TABS: ORAL_TABLET | ORAL | Status: DC

## 2016-01-31 MED ORDER — AZITHROMYCIN 250 MG PO TABS
250.0000 mg | ORAL_TABLET | Freq: Every day | ORAL | Status: DC
  Administered 2016-01-31: 250 mg via ORAL
  Filled 2016-01-31: qty 1

## 2016-01-31 MED ORDER — METOPROLOL SUCCINATE ER 50 MG PO TB24
50.0000 mg | ORAL_TABLET | Freq: Every day | ORAL | Status: DC
  Administered 2016-01-31: 50 mg via ORAL
  Filled 2016-01-31: qty 1

## 2016-01-31 MED ORDER — METOPROLOL SUCCINATE ER 50 MG PO TB24: 50.0000 mg | ORAL_TABLET | Freq: Every day | ORAL | Status: DC

## 2016-01-31 MED ORDER — AMIODARONE HCL 200 MG PO TABS
200.0000 mg | ORAL_TABLET | Freq: Two times a day (BID) | ORAL | Status: DC
  Administered 2016-01-31: 200 mg via ORAL
  Filled 2016-01-31: qty 1

## 2016-01-31 MED ORDER — FUROSEMIDE 20 MG PO TABS: 20.0000 mg | ORAL_TABLET | Freq: Every day | ORAL | Status: DC

## 2016-01-31 MED ORDER — LISINOPRIL 10 MG PO TABS: 10.0000 mg | ORAL_TABLET | Freq: Every day | ORAL | Status: DC

## 2016-01-31 NOTE — Progress Notes (Signed)
SATURATION QUALIFICATIONS: (This note is used to comply with regulatory documentation for home oxygen)  Patient Saturations on Room Air at Rest = 95%  Patient Saturations on Room Air while Ambulating = 94-96%  Patient Saturations on (not applicable) Liters of oxygen while Ambulating = (not applicable)  Please briefly explain why patient needs home oxygen: not applicable

## 2016-01-31 NOTE — Care Management Note (Signed)
Case Management Note  Patient Details  Name: Paul Davies MRN: [id number] Date of Birth: 1930/08/22  Subjective/Objective:  Late Entry: Pt admitted for PAF. Pt is from home with wife. Plan for d/c today.                    Action/Plan: Pt had used [facility] in the past and wanted to use again. [place] Referral was provided to agency and they no longer go to [place]. Family did have second choice for [place]. CM did make referral for [facility] and able to take the patient for services. SOC to begin within 24-48 hrs post d/c. No further needs from CM at this time.    Expected Discharge Date:                  Expected Discharge Plan:  [place]  In-House Referral:  NA  Discharge planning Services  CM Consult  Post Acute Care Choice:  NA Choice offered to:  NA  DME Arranged:  N/A DME Agency:  NA  HH Arranged:  RN HH Agency:  [place]  Status of Service:  Completed, signed off  Medicare Important Message Given:    Date Medicare IM Given:    Medicare IM give by:    Date Additional Medicare IM Given:    Additional Medicare Important Message give by:     If discussed at [place] of Stay Meetings, dates discussed:    Additional Comments:  Bethena Roys, RN 01/31/2016, 9:41 AM

## 2016-01-31 NOTE — Discharge Summary (Signed)
Physician Discharge Summary  Patient ID: Paul Davies MRN: [id number] DOB/AGE: 03-15-1930 82 y.o.  Admit date: 01/27/2016 Discharge date: 01/31/2016  Primary Physician: Dr. Melford Aase  Primary Discharge Diagnosis:  1.  Left lower lobe pneumonia with acute respiratory failure   Secondary Discharge Diagnosis: 2.  Atrial fibrillation with rapid ventricular response 3.  Acute on chronic systolic congestive heart failure 4.  Long-term anticoagulation with warfarin 5.  Dementia 6.  Functioning permanent pacemaker 7.  Atherosclerosis of aorta 8.  Sleep apnea  Procedures:  Echocardiogram  Hospital Course: This 82 year old male has dementia and has a prior history of paroxysmal atrial fibrillation, permanent pacemaker and stroke.  He presented to the emergency room with a two-week history of progressive cough and some dyspnea.  He became acutely short of breath this evening and was brought to the emergency room where he was found to be in atrial fibrillation with rapid ventricular response.  He had possible pneumonia noted on chest x-ray and was admitted to the hospital for further evaluation.  BNP was mildly elevated.  The patient was diuresed intravenously and started on treatment with ceftriaxone and Zithromax.  After diuresis and after having been given an intravenous amiodarone drip for atrial fibrillation he was started on metoprolol.  He converted to sinus rhythm on the eighth.  He diuresed approximately 10 pounds and his breathing improved significantly and cough improved.  After he went back to sinus rhythm he was placed back on amiodarone.  Ceftriaxone was continued for a total of 4 doses and a follow-up chest x-ray showed persistent effusion/atelectasis/pneumonia in the left lower lung.  He was clinically much improved and was able to be discharged home.  An echocardiogram showed an EF of around 40-45%.  There is also a mildly enlarged right ventricle.  He will be discharged on  metoprolol, lisinopril and low-dose aspirin as well as amiodarone to prevent recurrences of atrial fibrillation.  He does have paroxysmal atrial fibrillation thought to exacerbate his symptoms.  Arrangements were made for him to have home health care and a face-to-face encounter was done this admission.  This will need to be done because of his dementia and the complexity of his polypharmacy.  He will follow-up with me in one week.  Discharge Exam: Blood pressure 124/83, pulse 63, temperature 97.7 F (36.5 C), temperature source Oral, resp. rate 16, height 5\' 9"  (1.753 m), weight 81.285 kg (179 lb 3.2 oz), SpO2 93 %. Weight: 81.285 kg (179 lb 3.2 oz) Reduced breath sounds at the left base, regular rhythm, normal S1-S2 no S3 Labs: CBC:   Lab Results  Component Value Date   WBC 7.4 01/27/2016   HGB 12.9* 01/27/2016   HCT 39.4 01/27/2016   MCV 91.4 01/27/2016   PLT 214 01/27/2016    CMP:  Recent Labs Lab 01/28/16 1126  01/31/16 0317  NA 139  < > 139  K 4.4  < > 4.0  CL 110  < > 108  CO2 20*  < > 22  BUN 17  < > 21*  CREATININE 1.10  < > 1.29*  CALCIUM 8.8*  < > 8.6*  PROT 5.9*  --   --   BILITOT 0.6  --   --   ALKPHOS 54  --   --   ALT 28  --   --   AST 26  --   --   GLUCOSE 142*  < > 106*  < > = values in this interval not displayed.  Lipid  Panel     Component Value Date/Time   CHOL 200 12/23/2015 1041   TRIG 172* 12/23/2015 1041   HDL 57 12/23/2015 1041   CHOLHDL 3.5 12/23/2015 1041   VLDL 34* 12/23/2015 1041   LDLCALC 109 12/23/2015 1041   BNP (last 3 results)  Recent Labs  01/27/16 2306  BNP 454.3*   Thyroid: Lab Results  Component Value Date   TSH 4.212 01/28/2016   Hemoglobin A1C: Lab Results  Component Value Date   HGBA1C 6.0* 12/23/2015     Radiology: Left lower lobe density consistent with pleural effusion, atelectasis or pneumonia  EKG: The atrial fibrillation with rapid response with nonspecific ST and T wave changes.  Discharge  Medications:   Medication List    STOP taking these medications        ALEVE PM 220-25 MG Tabs  Generic drug:  Naproxen Sod-Diphenhydramine     ketoconazole 2 % cream  Commonly known as:  NIZORAL      TAKE these medications        amiodarone 200 MG tablet  Commonly known as:  PACERONE  Take 1 tablet (200 mg total) by mouth 2 (two) times daily.     azithromycin 250 MG tablet  Commonly known as:  ZITHROMAX  One tablet by mouth until gone     cholecalciferol 1000 units tablet  Commonly known as:  VITAMIN D  Take 2,000 Units by mouth daily.     furosemide 20 MG tablet  Commonly known as:  LASIX  Take 1 tablet (20 mg total) by mouth daily.     guaiFENesin 600 MG 12 hr tablet  Commonly known as:  MUCINEX  Take 1,200 mg by mouth 2 (two) times daily as needed for cough or to loosen phlegm.     hydrocortisone 2.5 % cream  Apply 1 application topically daily as needed (inflammation on face). To face     levothyroxine 50 MCG tablet  Commonly known as:  SYNTHROID, LEVOTHROID  Take 1 tablet (50 mcg total) by mouth daily before breakfast.     lisinopril 10 MG tablet  Commonly known as:  PRINIVIL,ZESTRIL  Take 1 tablet (10 mg total) by mouth daily.     metoprolol succinate 50 MG 24 hr tablet  Commonly known as:  TOPROL-XL  Take 1 tablet (50 mg total) by mouth daily. Take with or immediately following a meal.     NAMENDA XR 28 MG Cp24 24 hr capsule  Generic drug:  memantine  Take 1 capsule (28 mg total) by mouth daily.     tamsulosin 0.4 MG Caps capsule  Commonly known as:  FLOMAX  Take 1 capsule (0.4 mg total) by mouth daily.     warfarin 6 MG tablet  Commonly known as:  COUMADIN  Take 6 mg by mouth daily.        Followup plans and appointments: [name] in one week  Time spent with patient to include physician time:  25  Signed: W. Doristine Church. MD [facility] 01/31/2016, 8:50 AM

## 2016-01-31 NOTE — Care Management Important Message (Signed)
Important Message  Patient Details  Name: Paul Davies MRN: [id number] Date of Birth: Mar 10, 1930   Medicare Important Message Given:  Yes    Nathen May 01/31/2016, 11:33 AM

## 2016-02-01 ENCOUNTER — Other Ambulatory Visit: Payer: Self-pay | Admitting: Internal Medicine

## 2016-02-01 DIAGNOSIS — I480 Paroxysmal atrial fibrillation: Secondary | ICD-10-CM | POA: Diagnosis not present

## 2016-02-01 DIAGNOSIS — G2581 Restless legs syndrome: Secondary | ICD-10-CM | POA: Diagnosis not present

## 2016-02-01 DIAGNOSIS — J449 Chronic obstructive pulmonary disease, unspecified: Secondary | ICD-10-CM | POA: Diagnosis not present

## 2016-02-01 DIAGNOSIS — M1990 Unspecified osteoarthritis, unspecified site: Secondary | ICD-10-CM | POA: Diagnosis not present

## 2016-02-01 DIAGNOSIS — I110 Hypertensive heart disease with heart failure: Secondary | ICD-10-CM | POA: Diagnosis not present

## 2016-02-01 DIAGNOSIS — I5023 Acute on chronic systolic (congestive) heart failure: Secondary | ICD-10-CM | POA: Diagnosis not present

## 2016-02-02 LAB — CULTURE, BLOOD (ROUTINE X 2)
CULTURE: NO GROWTH
Culture: NO GROWTH

## 2016-02-05 DIAGNOSIS — I5023 Acute on chronic systolic (congestive) heart failure: Secondary | ICD-10-CM | POA: Diagnosis not present

## 2016-02-05 DIAGNOSIS — M1990 Unspecified osteoarthritis, unspecified site: Secondary | ICD-10-CM | POA: Diagnosis not present

## 2016-02-05 DIAGNOSIS — J449 Chronic obstructive pulmonary disease, unspecified: Secondary | ICD-10-CM | POA: Diagnosis not present

## 2016-02-05 DIAGNOSIS — I110 Hypertensive heart disease with heart failure: Secondary | ICD-10-CM | POA: Diagnosis not present

## 2016-02-05 DIAGNOSIS — I480 Paroxysmal atrial fibrillation: Secondary | ICD-10-CM | POA: Diagnosis not present

## 2016-02-05 DIAGNOSIS — G2581 Restless legs syndrome: Secondary | ICD-10-CM | POA: Diagnosis not present

## 2016-02-07 DIAGNOSIS — E784 Other hyperlipidemia: Secondary | ICD-10-CM | POA: Diagnosis not present

## 2016-02-07 DIAGNOSIS — G2581 Restless legs syndrome: Secondary | ICD-10-CM | POA: Diagnosis not present

## 2016-02-07 DIAGNOSIS — M1990 Unspecified osteoarthritis, unspecified site: Secondary | ICD-10-CM | POA: Diagnosis not present

## 2016-02-07 DIAGNOSIS — E039 Hypothyroidism, unspecified: Secondary | ICD-10-CM | POA: Diagnosis not present

## 2016-02-07 DIAGNOSIS — G4733 Obstructive sleep apnea (adult) (pediatric): Secondary | ICD-10-CM | POA: Diagnosis not present

## 2016-02-07 DIAGNOSIS — Z8673 Personal history of transient ischemic attack (TIA), and cerebral infarction without residual deficits: Secondary | ICD-10-CM | POA: Diagnosis not present

## 2016-02-07 DIAGNOSIS — I5023 Acute on chronic systolic (congestive) heart failure: Secondary | ICD-10-CM | POA: Diagnosis not present

## 2016-02-07 DIAGNOSIS — J449 Chronic obstructive pulmonary disease, unspecified: Secondary | ICD-10-CM | POA: Diagnosis not present

## 2016-02-07 DIAGNOSIS — Z7901 Long term (current) use of anticoagulants: Secondary | ICD-10-CM | POA: Diagnosis not present

## 2016-02-07 DIAGNOSIS — I951 Orthostatic hypotension: Secondary | ICD-10-CM | POA: Diagnosis not present

## 2016-02-07 DIAGNOSIS — Z950 Presence of cardiac pacemaker: Secondary | ICD-10-CM | POA: Diagnosis not present

## 2016-02-07 DIAGNOSIS — I480 Paroxysmal atrial fibrillation: Secondary | ICD-10-CM | POA: Diagnosis not present

## 2016-02-07 DIAGNOSIS — I110 Hypertensive heart disease with heart failure: Secondary | ICD-10-CM | POA: Diagnosis not present

## 2016-02-11 DIAGNOSIS — I480 Paroxysmal atrial fibrillation: Secondary | ICD-10-CM | POA: Diagnosis not present

## 2016-02-11 DIAGNOSIS — Z7901 Long term (current) use of anticoagulants: Secondary | ICD-10-CM | POA: Diagnosis not present

## 2016-02-14 DIAGNOSIS — I5023 Acute on chronic systolic (congestive) heart failure: Secondary | ICD-10-CM | POA: Diagnosis not present

## 2016-02-14 DIAGNOSIS — I110 Hypertensive heart disease with heart failure: Secondary | ICD-10-CM | POA: Diagnosis not present

## 2016-02-14 DIAGNOSIS — M1990 Unspecified osteoarthritis, unspecified site: Secondary | ICD-10-CM | POA: Diagnosis not present

## 2016-02-14 DIAGNOSIS — J449 Chronic obstructive pulmonary disease, unspecified: Secondary | ICD-10-CM | POA: Diagnosis not present

## 2016-02-14 DIAGNOSIS — I480 Paroxysmal atrial fibrillation: Secondary | ICD-10-CM | POA: Diagnosis not present

## 2016-02-14 DIAGNOSIS — G2581 Restless legs syndrome: Secondary | ICD-10-CM | POA: Diagnosis not present

## 2016-02-15 ENCOUNTER — Encounter (HOSPITAL_COMMUNITY): Payer: Self-pay | Admitting: *Deleted

## 2016-02-15 ENCOUNTER — Emergency Department (HOSPITAL_COMMUNITY)
Admission: EM | Admit: 2016-02-15 | Discharge: 2016-02-15 | Disposition: A | Discharge status: Home / Self Care | Payer: Medicare Other | Attending: Emergency Medicine | Admitting: Emergency Medicine

## 2016-02-15 ENCOUNTER — Other Ambulatory Visit: Payer: Self-pay

## 2016-02-15 ENCOUNTER — Emergency Department (HOSPITAL_COMMUNITY): Payer: Medicare Other

## 2016-02-15 DIAGNOSIS — I4891 Unspecified atrial fibrillation: Secondary | ICD-10-CM | POA: Insufficient documentation

## 2016-02-15 DIAGNOSIS — Z9981 Dependence on supplemental oxygen: Secondary | ICD-10-CM | POA: Insufficient documentation

## 2016-02-15 DIAGNOSIS — N179 Acute kidney failure, unspecified: Secondary | ICD-10-CM

## 2016-02-15 DIAGNOSIS — I959 Hypotension, unspecified: Secondary | ICD-10-CM | POA: Diagnosis present

## 2016-02-15 DIAGNOSIS — I951 Orthostatic hypotension: Secondary | ICD-10-CM | POA: Diagnosis not present

## 2016-02-15 DIAGNOSIS — Z9889 Other specified postprocedural states: Secondary | ICD-10-CM | POA: Insufficient documentation

## 2016-02-15 DIAGNOSIS — Z8739 Personal history of other diseases of the musculoskeletal system and connective tissue: Secondary | ICD-10-CM | POA: Diagnosis not present

## 2016-02-15 DIAGNOSIS — E039 Hypothyroidism, unspecified: Secondary | ICD-10-CM | POA: Insufficient documentation

## 2016-02-15 DIAGNOSIS — G4733 Obstructive sleep apnea (adult) (pediatric): Secondary | ICD-10-CM | POA: Diagnosis not present

## 2016-02-15 DIAGNOSIS — E559 Vitamin D deficiency, unspecified: Secondary | ICD-10-CM | POA: Insufficient documentation

## 2016-02-15 DIAGNOSIS — Z8673 Personal history of transient ischemic attack (TIA), and cerebral infarction without residual deficits: Secondary | ICD-10-CM | POA: Diagnosis not present

## 2016-02-15 DIAGNOSIS — Z85828 Personal history of other malignant neoplasm of skin: Secondary | ICD-10-CM | POA: Insufficient documentation

## 2016-02-15 DIAGNOSIS — Z79899 Other long term (current) drug therapy: Secondary | ICD-10-CM | POA: Insufficient documentation

## 2016-02-15 DIAGNOSIS — Z8701 Personal history of pneumonia (recurrent): Secondary | ICD-10-CM | POA: Diagnosis not present

## 2016-02-15 DIAGNOSIS — J449 Chronic obstructive pulmonary disease, unspecified: Secondary | ICD-10-CM | POA: Diagnosis not present

## 2016-02-15 DIAGNOSIS — E860 Dehydration: Secondary | ICD-10-CM | POA: Diagnosis not present

## 2016-02-15 DIAGNOSIS — I10 Essential (primary) hypertension: Secondary | ICD-10-CM | POA: Diagnosis not present

## 2016-02-15 DIAGNOSIS — J9811 Atelectasis: Secondary | ICD-10-CM | POA: Diagnosis not present

## 2016-02-15 DIAGNOSIS — Z7901 Long term (current) use of anticoagulants: Secondary | ICD-10-CM | POA: Insufficient documentation

## 2016-02-15 LAB — CBC WITH DIFFERENTIAL/PLATELET
BASOS ABS: 0 10*3/uL (ref 0.0–0.1)
BASOS PCT: 0 %
EOS ABS: 0.3 10*3/uL (ref 0.0–0.7)
EOS PCT: 3 %
HCT: 45 % (ref 39.0–52.0)
Hemoglobin: 14.5 g/dL (ref 13.0–17.0)
Lymphocytes Relative: 20 %
Lymphs Abs: 2 10*3/uL (ref 0.7–4.0)
MCH: 29.6 pg (ref 26.0–34.0)
MCHC: 32.2 g/dL (ref 30.0–36.0)
MCV: 91.8 fL (ref 78.0–100.0)
MONO ABS: 0.5 10*3/uL (ref 0.1–1.0)
Monocytes Relative: 5 %
Neutro Abs: 7.2 10*3/uL (ref 1.7–7.7)
Neutrophils Relative %: 72 %
PLATELETS: 179 10*3/uL (ref 150–400)
RBC: 4.9 MIL/uL (ref 4.22–5.81)
RDW: 13.1 % (ref 11.5–15.5)
WBC: 10 10*3/uL (ref 4.0–10.5)

## 2016-02-15 LAB — BASIC METABOLIC PANEL
Anion gap: 9 (ref 5–15)
BUN: 32 mg/dL — AB (ref 6–20)
CO2: 23 mmol/L (ref 22–32)
CREATININE: 1.69 mg/dL — AB (ref 0.61–1.24)
Calcium: 9.5 mg/dL (ref 8.9–10.3)
Chloride: 108 mmol/L (ref 101–111)
GFR calc Af Amer: 41 mL/min — ABNORMAL LOW (ref >60)
GFR, EST NON AFRICAN AMERICAN: 36 mL/min — AB (ref >60)
Glucose, Bld: 142 mg/dL — ABNORMAL HIGH (ref 65–99)
POTASSIUM: 4.5 mmol/L (ref 3.5–5.1)
SODIUM: 140 mmol/L (ref 135–145)

## 2016-02-15 LAB — BRAIN NATRIURETIC PEPTIDE: B NATRIURETIC PEPTIDE 5: 52.5 pg/mL (ref 0.0–100.0)

## 2016-02-15 LAB — PROTIME-INR
INR: 4.13 — AB (ref 0.00–1.49)
Prothrombin Time: 38.9 seconds — ABNORMAL HIGH (ref 11.6–15.2)

## 2016-02-15 MED ORDER — SODIUM CHLORIDE 0.9 % IV BOLUS (SEPSIS)
500.0000 mL | Freq: Once | INTRAVENOUS | Status: AC
  Administered 2016-02-15: 500 mL via INTRAVENOUS

## 2016-02-15 NOTE — ED Notes (Signed)
Patient's bp was 100/71 while standing up. Stated that he did not feel any dizziness the entire time while standing.

## 2016-02-15 NOTE — ED Notes (Signed)
MD at bedside. 

## 2016-02-15 NOTE — Discharge Instructions (Signed)
1. [name] recommended STOPPING lisinopril, lasix, and flomax until you follow up in clinic. 2. Take HALF your normal dose of metoprolol until follow up. 3. HOLD coumadin dose for today and tomorrow and have INR rechecked this week.

## 2016-02-15 NOTE — ED Provider Notes (Signed)
CSN: [id number]     Arrival date & time 02/15/16  1037 History   First MD Initiated Contact with Patient 02/15/16 1057     Chief Complaint  Patient presents with  . Hypotension     (Consider location/radiation/quality/duration/timing/severity/associated sxs/prior Treatment) HPI Comments: 82 year old male with extensive past medical history including CVA, COPD, pacemaker, atrial fibrillation on Coumadin who presents with hypotension. Wife states that they recently acquired medical equipment to check the patient's blood pressure at home. Yesterday his blood pressure was normal with systolics at [id number]. This morning, and she checked his blood pressure and it was 70/50. He was recently hospitalized for CHF and started on several new cardiac medications which he has been taking. Family reports that he has had a 10 pound weight loss since discharge. He reports normal appetite and normal urination. He has a chronic cough but denies any new cough. No chest pain, shortness of breath, fevers, vomiting, diarrhea, or any pain. Besides feeling slightly generally weak, he denies any other complaints. He does not have any lightheadedness.  The history is provided by the patient.    Past Medical History  Diagnosis Date  . Hyperlipidemia   . Hypertension   . RLS (restless legs syndrome)   . Osteopenia   . Hypothyroidism   . Vitamin D deficiency   . Pacemaker medtronic   . Atherosclerosis of aorta ([place]) 2010    noted on lumbar CT in 2010  . COPD (chronic obstructive pulmonary disease) ([place])     On CXR  . Sinoatrial node dysfunction (H[facility])   . Prediabetes   . Atrial fibrillation ([place])   . Basal cell carcinoma, face   . History of right MCA stroke ~ 2002; 2006  . Hypothyroidism   . Pneumonia 01/28/2016  . OSA on CPAP   . DJD (degenerative joint disease)   . Chronic sinusitis    Past Surgical History  Procedure Laterality Date  . Pacemaker generator change N/A 05/04/2014    Procedure: PACEMAKER  GENERATOR CHANGE;  Surgeon: [name], MD;  Location: [facility] CATH LAB;  Service: Cardiovascular;  Laterality: N/A;  . Shoulder open rotator cuff repair Right 02/2008  . Basal cell carcinoma excision      "face"  . Insert / replace / remove pacemaker  04/2005  . Cardiac catheterization  02/2004    normal coronary arteries  . Transurethral resection of prostate     Family History  Problem Relation Age of Onset  . Heart attack Father   . Heart attack Brother 45   Social History  Substance Use Topics  . Smoking status: Passive Smoke Exposure - Never Smoker  . Smokeless tobacco: Never Used  . Alcohol Use: No    Review of Systems 10 Systems reviewed and are negative for acute change except as noted in the HPI.    Allergies  Bee venom  Home Medications   Prior to Admission medications   Medication Sig Start Date End Date Taking? Authorizing Provider  amiodarone (PACERONE) 200 MG tablet Take 1 tablet (200 mg total) by mouth 2 (two) times daily. 01/31/16   Jacolyn Reedy, MD  azithromycin [facility]) 250 MG tablet One tablet by mouth until gone 01/31/16   Jacolyn Reedy, MD  cholecalciferol (VITAMIN D) 1000 UNITS tablet Take 2,000 Units by mouth daily.     Historical Provider, MD  furosemide (LASIX) 20 MG tablet Take 1 tablet (20 mg total) by mouth daily. 01/31/16   Jacolyn Reedy, MD  guaiFENesin (  MUCINEX) 600 MG 12 hr tablet Take 1,200 mg by mouth 2 (two) times daily as needed for cough or to loosen phlegm.    Historical Provider, MD  hydrocortisone 2.5 % cream Apply 1 application topically daily as needed (inflammation on face). To face    Historical Provider, MD  levothyroxine (SYNTHROID, LEVOTHROID) 50 MCG tablet Take 1 tablet (50 mcg total) by mouth daily before breakfast. 12/24/15   Unk Pinto, MD  lisinopril (PRINIVIL,ZESTRIL) 10 MG tablet Take 1 tablet (10 mg total) by mouth daily. 01/31/16   Jacolyn Reedy, MD  metoprolol succinate (TOPROL-XL) 50 MG 24 hr tablet Take 1  tablet (50 mg total) by mouth daily. Take with or immediately following a meal. 01/31/16   Jacolyn Reedy, MD  NAMENDA XR 28 MG CP24 24 hr capsule Take 1 capsule (28 mg total) by mouth daily. 09/09/15   Dennie Bible, NP  tamsulosin (FLOMAX) 0.4 MG CAPS capsule Take 1 capsule (0.4 mg total) by mouth daily. 01/01/15   Unk Pinto, MD  warfarin (COUMADIN) 6 MG tablet Take 6 mg by mouth daily.     Historical Provider, MD   BP 100/71 mmHg  Pulse 68  Temp(Src) 97.8 F (36.6 C) (Oral)  Resp 12  SpO2 96% Physical Exam  Constitutional: He is oriented to person, place, and time. He appears well-developed and well-nourished. No distress.  HENT:  Head: Normocephalic and atraumatic.  Moist mucous membranes  Eyes: Conjunctivae are normal. Pupils are equal, round, and reactive to light.  Neck: Neck supple.  Cardiovascular: Normal rate, regular rhythm and normal heart sounds.   No murmur heard. Pulmonary/Chest: Effort normal.  Coarse BS b/l  Abdominal: Soft. Bowel sounds are normal. He exhibits no distension. There is no tenderness.  Musculoskeletal: He exhibits no edema.  Neurological: He is alert and oriented to person, place, and time.  Fluent speech  Skin: Skin is warm and dry.  Psychiatric: He has a normal mood and affect. Judgment normal.  Nursing note and vitals reviewed.   ED Course  Procedures (including critical care time) Labs Review Labs Reviewed  BASIC METABOLIC PANEL - Abnormal; Notable for the following:    Glucose, Bld 142 (*)    BUN 32 (*)    Creatinine, Ser 1.69 (*)    GFR calc non Af Amer 36 (*)    GFR calc Af Amer 41 (*)    All other components within normal limits  PROTIME-INR - Abnormal; Notable for the following:    Prothrombin Time 38.9 (*)    INR 4.13 (*)    All other components within normal limits  BRAIN NATRIURETIC PEPTIDE  CBC WITH DIFFERENTIAL/PLATELET  URINALYSIS, ROUTINE W REFLEX MICROSCOPIC (NOT AT [facility])    Imaging Review Dg Chest 2  View  02/15/2016  CLINICAL DATA:  82 year old male with a history of hypotension EXAM: CHEST - 2 VIEW COMPARISON:  01/30/2016 FINDINGS: Cardiomediastinal silhouette unchanged in size and contour. Cardiac pacing device on left chest wall unchanged. Lung volumes remain low, although there has been improved aeration at the right and left base, with no pleural effusion. No pneumothorax. No confluent airspace disease. Rib deformity of posterior right rib, unchanged.  No new fracture. Atelectasis/scarring at the left base. IMPRESSION: Improved aeration since the prior, with resolving airspace disease. Atelectasis/scarring at the left base. Unchanged cardiac pacing device. Signed, Dulcy Fanny. Earleen Newport, DO [facility] Radiology Electronically Signed   By: Corrie Mckusick D.O.   On: 02/15/2016 12:24  I have personally reviewed and evaluated these lab results as part of my medical decision-making.   EKG Interpretation None     Medications  sodium chloride 0.9 % bolus 500 mL (0 mLs Intravenous Stopped 02/15/16 1313)  sodium chloride 0.9 % bolus 500 mL (0 mLs Intravenous Stopped 02/15/16 1423)   Filed Vitals:   02/15/16 1345 02/15/16 1400 02/15/16 1415 02/15/16 1423  BP: 100/62 100/58 100/62 100/71  Pulse: 58 65 60 68  Temp:      TempSrc:      Resp: 15 14 13 12   SpO2: 95% 95% 97% 96%    MDM   Final diagnoses:  Dehydration  Orthostatic hypotension  AKI (acute kidney injury) ([place])    Pt p/w hypotension noted today at home, Only complaint is mild generalized weakness. On exam, he was awake and alert, comfortable without complaints. Initial vital signs notable for hypotension at 91/61. The patient was orthostatic. Obtained chest x-ray which showed improved airspace disease. Labs show mildly worse creatinine at 1.69, BNP normal, normal CBC, INR slightly supratherapeutic at 4.13. Gave the patient 2 small fluid boluses after which his blood pressure improved.  Given his normal BNP and elevated creatinine, I suspect mild dehydration from overdiuresis. I discussed with the patient's primary cardiologist, [name], who recommended holding the patient's lisinopril, Lasix, and Flomax as well as 1/2 the dose of his metoprolol. He also recommended holding Coumadin for 2 days. I relayed this information to the patient and his family. Instructed to follow-up with [name] in 2-3 days. Patient felt well and was able to ambulate without problem at time of discharge. Patient discharged in satisfactory condition.   Sharlett Iles, MD 02/15/16 [phone]

## 2016-02-15 NOTE — ED Notes (Signed)
Pt presents from home c/o hypotension.  Pt reports taking BP at home and was 70/50s.  Pt reports recently being d/c with multiple new medications.  Pt reports minor generalized weakness, denies pain, fever.   Pt a x 4, NAD.

## 2016-02-16 ENCOUNTER — Telehealth: Payer: Self-pay | Admitting: Physician Assistant

## 2016-02-16 NOTE — Telephone Encounter (Signed)
    Home health RN called about Paul Davies. He had gained 5 pounds since yesterday when he was discharged from the emergency room for dehydration and orthostatic hypotension 2/2 to overdiuresis. His lisinopril, Flomax and Lasix were stopped. He is feeling well and asymptomatic with no shortness of breath. He was advised to call his primary cardiologist, [name] on Monday. Home health RN wants to know if he should restart his Lasix. With his recent dehydration and orthostatic hypotension I advised them to continue to hold his Lasix until he talked to [name]. If he develops symptoms of shortness of breath, orthopnea or PND she will call us back.  Angelena Form PA-C  [facility]   .

## 2016-02-17 ENCOUNTER — Other Ambulatory Visit: Payer: Self-pay | Admitting: *Deleted

## 2016-02-17 MED ORDER — NAMENDA XR 28 MG PO CP24: 28.0000 mg | ORAL_CAPSULE | Freq: Every day | ORAL | Status: DC

## 2016-02-19 DIAGNOSIS — I480 Paroxysmal atrial fibrillation: Secondary | ICD-10-CM | POA: Diagnosis not present

## 2016-02-19 DIAGNOSIS — G2581 Restless legs syndrome: Secondary | ICD-10-CM | POA: Diagnosis not present

## 2016-02-19 DIAGNOSIS — M1990 Unspecified osteoarthritis, unspecified site: Secondary | ICD-10-CM | POA: Diagnosis not present

## 2016-02-19 DIAGNOSIS — I5023 Acute on chronic systolic (congestive) heart failure: Secondary | ICD-10-CM | POA: Diagnosis not present

## 2016-02-19 DIAGNOSIS — J449 Chronic obstructive pulmonary disease, unspecified: Secondary | ICD-10-CM | POA: Diagnosis not present

## 2016-02-19 DIAGNOSIS — I110 Hypertensive heart disease with heart failure: Secondary | ICD-10-CM | POA: Diagnosis not present

## 2016-02-21 ENCOUNTER — Encounter: Payer: Self-pay | Admitting: Cardiology

## 2016-02-21 DIAGNOSIS — I480 Paroxysmal atrial fibrillation: Secondary | ICD-10-CM | POA: Diagnosis not present

## 2016-02-21 DIAGNOSIS — G4733 Obstructive sleep apnea (adult) (pediatric): Secondary | ICD-10-CM | POA: Diagnosis not present

## 2016-02-21 DIAGNOSIS — Z7901 Long term (current) use of anticoagulants: Secondary | ICD-10-CM | POA: Diagnosis not present

## 2016-02-21 DIAGNOSIS — E039 Hypothyroidism, unspecified: Secondary | ICD-10-CM | POA: Diagnosis not present

## 2016-02-21 DIAGNOSIS — Z8673 Personal history of transient ischemic attack (TIA), and cerebral infarction without residual deficits: Secondary | ICD-10-CM | POA: Diagnosis not present

## 2016-02-21 DIAGNOSIS — I951 Orthostatic hypotension: Secondary | ICD-10-CM | POA: Diagnosis not present

## 2016-02-21 DIAGNOSIS — E784 Other hyperlipidemia: Secondary | ICD-10-CM | POA: Diagnosis not present

## 2016-02-21 DIAGNOSIS — Z950 Presence of cardiac pacemaker: Secondary | ICD-10-CM | POA: Diagnosis not present

## 2016-02-21 NOTE — Progress Notes (Signed)
Patient ID: Paul Davies, male   DOB: April 24, 1930, 82 y.o.   MRN: [id number]   Paul Davies, Paul Davies  Date of visit:  02/21/2016 DOB:  1930/06/07    Age:  82 yrs. Medical record number:  [id number]     Account number:  [id number] Primary Care Provider: Unk Pinto D ____________________________ CURRENT DIAGNOSES  1. Paroxysmal atrial fibrillation  2. Long term (current) use of anticoagulants  3. Presence of cardiac pacemaker  4. Orthostatic hypotension  5. Personal history of transient ischemic attack (TIA), and cerebral infarction without residual deficits  6. Hypothyroidism  7. Hyperlipidemia  8. Sleep apnea  9. Sick sinus syndrome ____________________________ ALLERGIES  Atorvastatin, Muscle aches ____________________________ MEDICATIONS  1. Synthroid 50 mcg tablet, 1 p.o. daily  2. warfarin 6 mg tablet, Take as directed  3. Namenda XR 28 mg capsule sprinkle,extended release, 1 p.o. daily  4. amiodarone 200 mg tablet, 1 table by mouth daily  5. metoprolol succinate ER 25 mg tablet,extended release 24 hr, 1 p.o. daily ____________________________ CHIEF COMPLAINTS  followup er ____________________________ HISTORY OF PRESENT ILLNESS Patient seen for cardiac followup. Since he was seen he went to the emergency room because his wife he required a blood pressure cuff and noted his blood pressure to be low. He was not really having any symptoms at the time and in the emergency room blood pressure was low but came up with some fluids. I asked them to stop his furosemide as well as his lisinopril as well as reduce his metoprolol to one half tablet daily. He has really felt well since then. He has made a good recovery from his pneumonia. 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 He has no PND, orthopnea, syncope, or claudication. No bleeding complications from Coumadin. ____________________________ PAST HISTORY  Past Medical Illnesses:  hyperlipidemia, history of CVA without  deficits, CVA 6/06 treated with thrombolytic experimental protocol, mild dementia, orthostatic hypotension;  Cardiovascular Illnesses:  atrial fibrillation, sick sinus syndrome, orthostatic hypotension;  Surgical Procedures:  shoulder repair-rt 2009;  NYHA Classification:  I;  Canadian Angina Classification:  Class 0: Asymptomatic;  Cardiology Procedures-Invasive:  cardiac cath (left) April 2005, Medtronic pacemaker implant June 2006, Medtronic generator change June 2015;  Cardiology Procedures-Noninvasive:  adenosine cardiolite March 2005, echocardiogram June 2006, echocardiogram March 2017;  Cardiac Cath Results:  normal coronary arteries, normal LVSF;  LVEF of 45% documented via echocardiogram on 01/28/2016,   CHA2DS2-VASC Score:  4 ____________________________ CARDIO-PULMONARY TEST DATES EKG Date:  02/07/2016;   Cardiac Cath Date:  03/06/2004;  Holter/Event Monitor Date: 08/29/2004;  Nuclear Study Date:  02/15/2004;  Echocardiography Date: 01/28/2016;  Chest Xray Date: 09/13/2007;   ____________________________ FAMILY HISTORY Brother -- Brother dead, Congestive heart failure Brother -- Brother alive and well Father -- Father dead, Congestive heart failure Mother -- Mother dead, Alzheimers disease Sister -- Sister dead, Alzheimers disease Sister -- Sister alive and well Sister -- Sister dead, Alzheimers disease ____________________________ SOCIAL HISTORY Alcohol Use:  no alcohol use;  Smoking:  never smoked;  Diet:  regular diet;  Lifestyle:  married;  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;  Occupation:  retired and farming;  Residence:  lives with wife;   ____________________________ REVIEW OF SYSTEMS General:  feels well, no change in exercise tolerance., no change in weight Eyes: wears eye glasses/contact lenses Respiratory: sleep apnea, wears CPAP Cardiovascular:  please review HPI Abdominal: denies dyspepsia, GI bleeding, constipation, or diarrhea Genitourinary-Male: frequency, nocturia   Musculoskeletal:  denies arthritis,  venous insufficiency, or muscle weakness Neurological:  mild memory loss  ____________________________ PHYSICAL EXAMINATION VITAL SIGNS  Blood Pressure:  110/64 Sitting, Right arm, regular cuff  , 106/66 Standing, Right arm and regular cuff   Pulse:  78/min. Weight:  171.00 lbs. Height:  69"BMI: 25  Constitutional:  pleasant white male in no acute distress Skin:  large lipoma riight shoulder Head:  normocephalic, balding male hair pattern ENT:  hearing aide present left ear Neck:  supple, no masses, thyromegaly, JVD. Carotid pulses are full and equal bilaterally without bruits. Chest:  normal symmetry, clear to auscultation, healed pacemaker incision in the left pectoral area Cardiac:  regular rhythm, normal S1 and S2, No S3 or S4, no murmurs, gallops or rubs detected. Peripheral Pulses:  femoral pulses 2+, dorsalis pedis pulse 2+ on the left, dorsalis pedis pulse absent on the right, posterior tibial pulses 2+ Extremities & Back:  no deformities, clubbing, cyanosis, erythema or edema observed. Normal muscle strength and tone. Neurological:  no gross motor or sensory deficits noted, affect appropriate, oriented x3. ____________________________ MOST RECENT LIPID PANEL 12/11/13  CHOL TOTL 253 mg/dl, LDL 151 NM, HDL 66 mg/dl, TRIGLYCER 179 mg/dl, ALT 9 u/l, ALK PHOS 58 u/l, CHOL/HDL 3.8 (Calc) and AST 17 u/l ____________________________ IMPRESSIONS/PLAN 1. Recent hypotension likely due to medications that have been stopped 2. Paroxysmal atrial fibrillation currently in sinus rhythm on amiodarone 3. History of stroke and TIA 4. Hypothyroidism under treatment  Recommendations:  Stay off of lisinopril and furosemide at the present time. May need to restart some furosemide. His Flomax was also stopped recently and this could contribute to hypotension. Followup in 3 months and call if problems.  ____________________________ TODAYS ORDERS  1. Return  Visit: 3 months                       ____________________________ Cardiology Physician:  Kerry Hough MD [facility]

## 2016-02-25 DIAGNOSIS — I5023 Acute on chronic systolic (congestive) heart failure: Secondary | ICD-10-CM | POA: Diagnosis not present

## 2016-02-25 DIAGNOSIS — G2581 Restless legs syndrome: Secondary | ICD-10-CM | POA: Diagnosis not present

## 2016-02-25 DIAGNOSIS — I480 Paroxysmal atrial fibrillation: Secondary | ICD-10-CM | POA: Diagnosis not present

## 2016-02-25 DIAGNOSIS — M1990 Unspecified osteoarthritis, unspecified site: Secondary | ICD-10-CM | POA: Diagnosis not present

## 2016-02-25 DIAGNOSIS — I110 Hypertensive heart disease with heart failure: Secondary | ICD-10-CM | POA: Diagnosis not present

## 2016-02-25 DIAGNOSIS — J449 Chronic obstructive pulmonary disease, unspecified: Secondary | ICD-10-CM | POA: Diagnosis not present

## 2016-02-27 DIAGNOSIS — I5023 Acute on chronic systolic (congestive) heart failure: Secondary | ICD-10-CM | POA: Diagnosis not present

## 2016-02-27 DIAGNOSIS — I480 Paroxysmal atrial fibrillation: Secondary | ICD-10-CM | POA: Diagnosis not present

## 2016-02-27 DIAGNOSIS — G2581 Restless legs syndrome: Secondary | ICD-10-CM | POA: Diagnosis not present

## 2016-02-27 DIAGNOSIS — J449 Chronic obstructive pulmonary disease, unspecified: Secondary | ICD-10-CM | POA: Diagnosis not present

## 2016-02-27 DIAGNOSIS — I110 Hypertensive heart disease with heart failure: Secondary | ICD-10-CM | POA: Diagnosis not present

## 2016-02-27 DIAGNOSIS — M1990 Unspecified osteoarthritis, unspecified site: Secondary | ICD-10-CM | POA: Diagnosis not present

## 2016-03-03 DIAGNOSIS — J449 Chronic obstructive pulmonary disease, unspecified: Secondary | ICD-10-CM | POA: Diagnosis not present

## 2016-03-03 DIAGNOSIS — M1990 Unspecified osteoarthritis, unspecified site: Secondary | ICD-10-CM | POA: Diagnosis not present

## 2016-03-03 DIAGNOSIS — G2581 Restless legs syndrome: Secondary | ICD-10-CM | POA: Diagnosis not present

## 2016-03-03 DIAGNOSIS — I5023 Acute on chronic systolic (congestive) heart failure: Secondary | ICD-10-CM | POA: Diagnosis not present

## 2016-03-03 DIAGNOSIS — I480 Paroxysmal atrial fibrillation: Secondary | ICD-10-CM | POA: Diagnosis not present

## 2016-03-03 DIAGNOSIS — I110 Hypertensive heart disease with heart failure: Secondary | ICD-10-CM | POA: Diagnosis not present

## 2016-03-05 DIAGNOSIS — G2581 Restless legs syndrome: Secondary | ICD-10-CM | POA: Diagnosis not present

## 2016-03-05 DIAGNOSIS — D1801 Hemangioma of skin and subcutaneous tissue: Secondary | ICD-10-CM | POA: Diagnosis not present

## 2016-03-05 DIAGNOSIS — L814 Other melanin hyperpigmentation: Secondary | ICD-10-CM | POA: Diagnosis not present

## 2016-03-05 DIAGNOSIS — I5023 Acute on chronic systolic (congestive) heart failure: Secondary | ICD-10-CM | POA: Diagnosis not present

## 2016-03-05 DIAGNOSIS — I110 Hypertensive heart disease with heart failure: Secondary | ICD-10-CM | POA: Diagnosis not present

## 2016-03-05 DIAGNOSIS — L821 Other seborrheic keratosis: Secondary | ICD-10-CM | POA: Diagnosis not present

## 2016-03-05 DIAGNOSIS — Z85828 Personal history of other malignant neoplasm of skin: Secondary | ICD-10-CM | POA: Diagnosis not present

## 2016-03-05 DIAGNOSIS — I480 Paroxysmal atrial fibrillation: Secondary | ICD-10-CM | POA: Diagnosis not present

## 2016-03-05 DIAGNOSIS — L218 Other seborrheic dermatitis: Secondary | ICD-10-CM | POA: Diagnosis not present

## 2016-03-05 DIAGNOSIS — J449 Chronic obstructive pulmonary disease, unspecified: Secondary | ICD-10-CM | POA: Diagnosis not present

## 2016-03-05 DIAGNOSIS — M1990 Unspecified osteoarthritis, unspecified site: Secondary | ICD-10-CM | POA: Diagnosis not present

## 2016-03-09 DIAGNOSIS — M1990 Unspecified osteoarthritis, unspecified site: Secondary | ICD-10-CM | POA: Diagnosis not present

## 2016-03-09 DIAGNOSIS — G2581 Restless legs syndrome: Secondary | ICD-10-CM | POA: Diagnosis not present

## 2016-03-09 DIAGNOSIS — I110 Hypertensive heart disease with heart failure: Secondary | ICD-10-CM | POA: Diagnosis not present

## 2016-03-09 DIAGNOSIS — J449 Chronic obstructive pulmonary disease, unspecified: Secondary | ICD-10-CM | POA: Diagnosis not present

## 2016-03-09 DIAGNOSIS — I480 Paroxysmal atrial fibrillation: Secondary | ICD-10-CM | POA: Diagnosis not present

## 2016-03-09 DIAGNOSIS — I5023 Acute on chronic systolic (congestive) heart failure: Secondary | ICD-10-CM | POA: Diagnosis not present

## 2016-03-12 ENCOUNTER — Other Ambulatory Visit: Payer: Self-pay | Admitting: Neurology

## 2016-03-16 ENCOUNTER — Ambulatory Visit (INDEPENDENT_AMBULATORY_CARE_PROVIDER_SITE_OTHER): Payer: Medicare Other | Admitting: Neurology

## 2016-03-16 ENCOUNTER — Encounter: Payer: Self-pay | Admitting: Neurology

## 2016-03-16 VITALS — BP 138/84 | HR 68 | Ht 68.0 in | Wt 176.4 lb

## 2016-03-16 DIAGNOSIS — I2510 Atherosclerotic heart disease of native coronary artery without angina pectoris: Secondary | ICD-10-CM

## 2016-03-16 DIAGNOSIS — I480 Paroxysmal atrial fibrillation: Secondary | ICD-10-CM | POA: Diagnosis not present

## 2016-03-16 DIAGNOSIS — F0390 Unspecified dementia without behavioral disturbance: Secondary | ICD-10-CM | POA: Diagnosis not present

## 2016-03-16 DIAGNOSIS — I110 Hypertensive heart disease with heart failure: Secondary | ICD-10-CM | POA: Diagnosis not present

## 2016-03-16 DIAGNOSIS — M1990 Unspecified osteoarthritis, unspecified site: Secondary | ICD-10-CM | POA: Diagnosis not present

## 2016-03-16 DIAGNOSIS — J449 Chronic obstructive pulmonary disease, unspecified: Secondary | ICD-10-CM | POA: Diagnosis not present

## 2016-03-16 DIAGNOSIS — G2581 Restless legs syndrome: Secondary | ICD-10-CM | POA: Diagnosis not present

## 2016-03-16 DIAGNOSIS — I5023 Acute on chronic systolic (congestive) heart failure: Secondary | ICD-10-CM | POA: Diagnosis not present

## 2016-03-16 NOTE — Patient Instructions (Signed)
I had a long discussion with the patient and his wife regarding his mild dementia as well as stroke risk and answered questions. Continue warfarin for stroke prevention with strict control of hypertension with blood pressure goal below 130/90. Continue on Namenda etc. 28 mg for his mild dementia which appear stable. Patient was again advised about fall and safety precautions.He will return for follow-up in a year with Cecille Rubin, nurse practitioner or call earlier if necessary. Fall Prevention in Hospitals, Adult As a hospital patient, your condition and the treatments you receive can increase your risk for falls. Some additional risk factors for falls in a hospital include:  Being in an unfamiliar environment.  Being on bed rest.  Your surgery.  Taking certain medicines.  Your tubing requirements, such as intravenous (IV) therapy or catheters. It is important that you learn how to decrease fall risks while at the hospital. Below are important tips that can help prevent falls. SAFETY TIPS FOR PREVENTING FALLS Talk about your risk of falling.  Ask your health care provider why you are at risk for falling. Is it your medicine, illness, tubing placement, or something else?  Make a plan with your health care provider to keep you safe from falls.  Ask your health care provider or pharmacist about side effects of your medicines. Some medicines can make you dizzy or affect your coordination. Ask for help.  Ask for help before getting out of bed. You may need to press your call button.  Ask for assistance in getting safely to the toilet.  Ask for a walker or cane to be put at your bedside. Ask that most of the side rails on your bed be placed up before your health care provider leaves the room.  Ask family or friends to sit with you.  Ask for things that are out of your reach, such as your glasses, hearing aids, telephone, bedside table, or call button. Follow these tips to avoid  falling:  Stay lying or seated, rather than standing, while waiting for help.  Wear rubber-soled slippers or shoes whenever you walk in the hospital.  Avoid quick, sudden movements.  Change positions slowly.  Sit on the side of your bed before standing.  Stand up slowly and wait before you start to walk.  Let your health care provider know if there is a spill on the floor.  Pay careful attention to the medical equipment, electrical cords, and tubes around you.  When you need help, use your call button by your bed or in the bathroom. Wait for one of your health care providers to help you.  If you feel dizzy or unsure of your footing, return to bed and wait for assistance.  Avoid being distracted by the TV, telephone, or another person in your room.  Do not lean or support yourself on rolling objects, such as IV poles or bedside tables.   This information is not intended to replace advice given to you by your health care provider. Make sure you discuss any questions you have with your health care provider.   Document Released: 11/06/2000 Document Revised: 11/30/2014 Document Reviewed: 07/17/2012 Elsevier Interactive Patient Education [employer].

## 2016-03-16 NOTE — Progress Notes (Signed)
[facility]  PATIENT: Paul Davies DOB: [birth year]   REASON FOR VISIT: Follow-up for history of stroke and memory loss  HISTORY FROM: Patient and wife    HISTORY OF PRESENT ILLNESS:Paul Davies, 82 year Caucasian male with long-standing history of mild dementia as well as remote right MCA branch infarct secondary to cardiac embolism from atrial fibrillation in June 2006 who is seen today for followup after last visit on 09/06/14. The patient is accompanied today by his wife they state his memory is stable . He however still remains quite independent in activities of daily living. He is quite active planted his tomato garden and mowes his yard with his tractor. He has no issues with agitation, delusions, hallucinations. He does go out independently and has never gotten lost. He has no issues with gait and has good balance and no falls He has been on Namenda 10 mg twice daily for several years. He has no new complaints today. His memory score is stable Update 03/16/2016 : He returns for follow-up after last visit to 1 year ago. Is a complaint by his wife. Patient was admitted to the hospital in March for 5 days for congestive heart failure and pneumonia flareup. He was confused and disoriented during this hospitalization but has gradually recovered back to his baseline since discharge. He has finished course of Zithromax. He was taken back to the emergency room a few weeks later for dehydration and improved after IV fluids. The patient's wife states that cognitively he is about the same. Continues to have mild short-term memory difficulties. He however remains quite independent in most activities of daily living though he needs some supervision. His blood pressure is well controlled and today it is 138/68. He remains on warfarin which is tolerating well without bleeding or bruising but INR has been fluctuating up and down. 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 He has not had any further episodes of confusion, delusions, hallucination, agitation or behavioral disturbances. He remains on Namenda which is tolerating well without side effects.  REVIEW OF SYSTEMS: Full 14 system review of systems performed and notable only for those listed, all others are neg:  Cough, hearing loss, trouble swallowing, restless leg, heat intolerance, diarrhea, apnea, daytime sleepiness, snoring, difficulty urinating, memory loss, dizziness, weakness, confusion, nervousness, rash  ALLERGIES: Allergies  Allergen Reactions  . Bee Venom Anaphylaxis    HOME MEDICATIONS: Outpatient Prescriptions Prior to Visit  Medication Sig Dispense Refill  . amiodarone (PACERONE) 200 MG tablet Take 1 tablet (200 mg total) by mouth 2 (two) times daily. 30 tablet 0  . cholecalciferol (VITAMIN D) 1000 UNITS tablet Take 2,000 Units by mouth daily.     . furosemide (LASIX) 20 MG tablet Take 1 tablet (20 mg total) by mouth daily. 30 tablet 0  . guaiFENesin (MUCINEX) 600 MG 12 hr tablet Take 1,200 mg by mouth 2 (two) times daily as needed for cough or to loosen phlegm.    . hydrocortisone 2.5 % cream Apply 1 application topically daily as needed (inflammation on face). To face    . levothyroxine (SYNTHROID, LEVOTHROID) 50 MCG tablet Take 1 tablet (50 mcg total) by mouth daily before breakfast. 90 tablet 1  . NAMENDA XR 28 MG CP24 24 hr capsule TAKE 1 CAPSULE DAILY 90 capsule 1  . warfarin (COUMADIN) 6 MG tablet Take 6 mg by mouth daily.     Marland Kitchen  tamsulosin (FLOMAX) 0.4 MG CAPS capsule Take 1 capsule (0.4 mg total) by mouth daily. (Patient not taking: Reported on 03/16/2016) 90 capsule 3  . azithromycin (ZITHROMAX) 250 MG tablet One tablet by mouth until gone (Patient not taking: Reported on 03/16/2016) 4 each 0  . lisinopril (PRINIVIL,ZESTRIL) 10 MG tablet Take 1 tablet (10 mg total) by mouth daily. (Patient not taking: Reported on 03/16/2016) 30 tablet 0    . metoprolol succinate (TOPROL-XL) 50 MG 24 hr tablet Take 1 tablet (50 mg total) by mouth daily. Take with or immediately following a meal. (Patient not taking: Reported on 03/16/2016) 30 tablet 0   No facility-administered medications prior to visit.    PAST MEDICAL HISTORY: Past Medical History  Diagnosis Date  . Hyperlipidemia   . Hypertension   . RLS (restless legs syndrome)   . Osteopenia   . Hypothyroidism   . Vitamin D deficiency   . Pacemaker medtronic   . Atherosclerosis of aorta ([place]) 2010    noted on lumbar CT in 2010  . COPD (chronic obstructive pulmonary disease) ([place])     On CXR  . Sinoatrial node dysfunction ([facility])   . Prediabetes   . Atrial fibrillation ([place])   . Basal cell carcinoma, face   . History of right MCA stroke ~ 2002; 2006  . Hypothyroidism   . Pneumonia 01/28/2016  . OSA on CPAP   . DJD (degenerative joint disease)   . Chronic sinusitis     PAST SURGICAL HISTORY: Past Surgical History  Procedure Laterality Date  . Pacemaker generator change N/A 05/04/2014    Procedure: PACEMAKER GENERATOR CHANGE;  Surgeon: [name], MD;  Location: [facility] CATH LAB;  Service: Cardiovascular;  Laterality: N/A;  . Shoulder open rotator cuff repair Right 02/2008  . Basal cell carcinoma excision      "face"  . Insert / replace / remove pacemaker  04/2005  . Cardiac catheterization  02/2004    normal coronary arteries  . Transurethral resection of prostate      FAMILY HISTORY: Family History  Problem Relation Age of Onset  . Heart attack Father   . Heart attack Brother 58  . Stroke Sister   . Dementia Sister     SOCIAL HISTORY: Social History   Social History  . Marital Status: Married    Spouse Name: N/A  . Number of Children: 5  . Years of Education: 11   Occupational History  . Retired    Social History Main Topics  . Smoking status: Passive Smoke Exposure - Never Smoker  . Smokeless tobacco: Never Used  . Alcohol Use: No  . Drug Use: No  .  Sexual Activity: Not on file   Other Topics Concern  . Not on file   Social History Narrative   Patient lives at home with his wife    Patient drinks coffee daily     PHYSICAL EXAM  Filed Vitals:   03/16/16 0945  BP: 138/84  Pulse: 68  Height: 5\' 8"  (1.727 m)  Weight: 176 lb 6.4 oz (80.015 kg)   Body mass index is 26.83 kg/(m^2). General: well developed, well nourished elderly Caucasian male, seated, in no evident distress  Head: head normocephalic and atraumatic.   Neck: supple with no carotid or supraclavicular bruits  Musculoskeletal: no deformity  Skin: no rash/petichiae  Vascular: Normal pulses all extremities  Neurologic Exam  Mental Status: Awake and fully alert. Oriented to place and time. Recent and remote memory diminished. Mini-Mental  status exam score 27/30  ( last visit 29/30 )missing 1 of 3 recall and 2 in orientation only.  Animal naming test 10. Clock drawing 4/4.  Attention span, concentration and fund of knowledge appropriate. Mood and affect appropriate.  Cranial Nerves: Fundoscopic exam not done. Pupils equal, briskly reactive to light. Extraocular movements full without nystagmus. Visual fields full to confrontation. Hard of hearing. Facial sensation intact. Face, tongue, palate moves normally and symmetrically.  Motor: Normal bulk and tone. Normal strength in all tested extremity muscles.  Sensory.: intact to touch and pinprick and vibratory sensation.  Coordination: Rapid alternating movements normal in all extremities. Finger-to-nose and heel-to-shin performed accurately bilaterally.  Gait and Station: Arises from chair without difficulty. Stance is stooped. Gait demonstrates short stride length unable to heel, toe and tandem walk without difficulty.  Reflexes: 1+ and symmetric. Toes downgoing.    DIAGNOSTIC DATA (LABS, IMAGING, TESTING) - I reviewed patient records, labs, notes, testing and imaging myself where available.  Lab Results    Component Value Date   WBC 10.0 02/15/2016   HGB 14.5 02/15/2016   HCT 45.0 02/15/2016   MCV 91.8 02/15/2016   PLT 179 02/15/2016      Component Value Date/Time   NA 140 02/15/2016 1052   K 4.5 02/15/2016 1052   CL 108 02/15/2016 1052   CO2 23 02/15/2016 1052   GLUCOSE 142* 02/15/2016 1052   BUN 32* 02/15/2016 1052   CREATININE 1.69* 02/15/2016 1052   CREATININE 0.90 12/23/2015 1041   CALCIUM 9.5 02/15/2016 1052   PROT 5.9* 01/28/2016 1126   ALBUMIN 3.3* 01/28/2016 1126   AST 26 01/28/2016 1126   ALT 28 01/28/2016 1126   ALKPHOS 54 01/28/2016 1126   BILITOT 0.6 01/28/2016 1126   GFRNONAA 36* 02/15/2016 1052   GFRNONAA 79 12/23/2015 1041   GFRAA 41* 02/15/2016 1052   GFRAA >89 12/23/2015 1041   Lab Results  Component Value Date   CHOL 200 12/23/2015   HDL 57 12/23/2015   LDLCALC 109 12/23/2015   TRIG 172* 12/23/2015   CHOLHDL 3.5 12/23/2015   Lab Results  Component Value Date   HGBA1C 6.0* 12/23/2015   No results found for: [id number] Lab Results  Component Value Date   TSH 4.212 01/28/2016      ASSESSMENT AND PLAN  82 y.o. year old male  has a past medical history of Stroke; Hyperlipidemia; Hypertension; OSA (obstructive sleep apnea); RLS (restless legs syndrome);Pacemaker medtronic; DJD (degenerative joint disease); Atherosclerosis of aorta (2010); COPD (chronic obstructive pulmonary disease); Sinoatrial node dysfunction; Prediabetes; and Atrial fibrillation. hto follow-up  I had a long discussion with the patient and his wife regarding his mild dementia as well as stroke risk and answered questions. Continue warfarin for stroke prevention with strict control of hypertension with blood pressure goal below 130/90. Continue on Namenda etc. 28 mg for his mild dementia which appear stable. Patient was again advised about fall and safety precautions greater than 50% time during this 25 minute visit was spent on counseling and coordination of care about his dementia  , stroke risk. and fall precautions.He will return for follow-up in a year with Cecille Rubin, nurse practitioner or call earlier if necessary. Antony Contras, MD [facility] [address] [place], [place] [address] [phone]

## 2016-03-18 DIAGNOSIS — I5023 Acute on chronic systolic (congestive) heart failure: Secondary | ICD-10-CM | POA: Diagnosis not present

## 2016-03-18 DIAGNOSIS — I480 Paroxysmal atrial fibrillation: Secondary | ICD-10-CM | POA: Diagnosis not present

## 2016-03-18 DIAGNOSIS — M1990 Unspecified osteoarthritis, unspecified site: Secondary | ICD-10-CM | POA: Diagnosis not present

## 2016-03-18 DIAGNOSIS — I110 Hypertensive heart disease with heart failure: Secondary | ICD-10-CM | POA: Diagnosis not present

## 2016-03-18 DIAGNOSIS — G2581 Restless legs syndrome: Secondary | ICD-10-CM | POA: Diagnosis not present

## 2016-03-18 DIAGNOSIS — J449 Chronic obstructive pulmonary disease, unspecified: Secondary | ICD-10-CM | POA: Diagnosis not present

## 2016-03-24 DIAGNOSIS — Z950 Presence of cardiac pacemaker: Secondary | ICD-10-CM | POA: Diagnosis not present

## 2016-03-24 DIAGNOSIS — I951 Orthostatic hypotension: Secondary | ICD-10-CM | POA: Diagnosis not present

## 2016-03-24 DIAGNOSIS — E039 Hypothyroidism, unspecified: Secondary | ICD-10-CM | POA: Diagnosis not present

## 2016-03-24 DIAGNOSIS — G4733 Obstructive sleep apnea (adult) (pediatric): Secondary | ICD-10-CM | POA: Diagnosis not present

## 2016-03-24 DIAGNOSIS — E785 Hyperlipidemia, unspecified: Secondary | ICD-10-CM | POA: Diagnosis not present

## 2016-03-24 DIAGNOSIS — I480 Paroxysmal atrial fibrillation: Secondary | ICD-10-CM | POA: Diagnosis not present

## 2016-03-24 DIAGNOSIS — Z8673 Personal history of transient ischemic attack (TIA), and cerebral infarction without residual deficits: Secondary | ICD-10-CM | POA: Diagnosis not present

## 2016-03-24 DIAGNOSIS — Z7901 Long term (current) use of anticoagulants: Secondary | ICD-10-CM | POA: Diagnosis not present

## 2016-03-25 DIAGNOSIS — I5023 Acute on chronic systolic (congestive) heart failure: Secondary | ICD-10-CM | POA: Diagnosis not present

## 2016-03-25 DIAGNOSIS — G2581 Restless legs syndrome: Secondary | ICD-10-CM | POA: Diagnosis not present

## 2016-03-25 DIAGNOSIS — M1990 Unspecified osteoarthritis, unspecified site: Secondary | ICD-10-CM | POA: Diagnosis not present

## 2016-03-25 DIAGNOSIS — J449 Chronic obstructive pulmonary disease, unspecified: Secondary | ICD-10-CM | POA: Diagnosis not present

## 2016-03-25 DIAGNOSIS — I480 Paroxysmal atrial fibrillation: Secondary | ICD-10-CM | POA: Diagnosis not present

## 2016-03-25 DIAGNOSIS — I110 Hypertensive heart disease with heart failure: Secondary | ICD-10-CM | POA: Diagnosis not present

## 2016-03-31 DIAGNOSIS — I480 Paroxysmal atrial fibrillation: Secondary | ICD-10-CM | POA: Diagnosis not present

## 2016-03-31 DIAGNOSIS — Z7901 Long term (current) use of anticoagulants: Secondary | ICD-10-CM | POA: Diagnosis not present

## 2016-03-31 DIAGNOSIS — Z8673 Personal history of transient ischemic attack (TIA), and cerebral infarction without residual deficits: Secondary | ICD-10-CM | POA: Diagnosis not present

## 2016-03-31 DIAGNOSIS — Z950 Presence of cardiac pacemaker: Secondary | ICD-10-CM | POA: Diagnosis not present

## 2016-03-31 DIAGNOSIS — E785 Hyperlipidemia, unspecified: Secondary | ICD-10-CM | POA: Diagnosis not present

## 2016-03-31 DIAGNOSIS — I951 Orthostatic hypotension: Secondary | ICD-10-CM | POA: Diagnosis not present

## 2016-03-31 DIAGNOSIS — G4733 Obstructive sleep apnea (adult) (pediatric): Secondary | ICD-10-CM | POA: Diagnosis not present

## 2016-03-31 DIAGNOSIS — E039 Hypothyroidism, unspecified: Secondary | ICD-10-CM | POA: Diagnosis not present

## 2016-04-07 DIAGNOSIS — Z7901 Long term (current) use of anticoagulants: Secondary | ICD-10-CM | POA: Diagnosis not present

## 2016-04-07 DIAGNOSIS — Z950 Presence of cardiac pacemaker: Secondary | ICD-10-CM | POA: Diagnosis not present

## 2016-04-07 DIAGNOSIS — I951 Orthostatic hypotension: Secondary | ICD-10-CM | POA: Diagnosis not present

## 2016-04-07 DIAGNOSIS — E039 Hypothyroidism, unspecified: Secondary | ICD-10-CM | POA: Diagnosis not present

## 2016-04-07 DIAGNOSIS — I480 Paroxysmal atrial fibrillation: Secondary | ICD-10-CM | POA: Diagnosis not present

## 2016-04-07 DIAGNOSIS — Z8673 Personal history of transient ischemic attack (TIA), and cerebral infarction without residual deficits: Secondary | ICD-10-CM | POA: Diagnosis not present

## 2016-04-07 DIAGNOSIS — G4733 Obstructive sleep apnea (adult) (pediatric): Secondary | ICD-10-CM | POA: Diagnosis not present

## 2016-04-07 DIAGNOSIS — E785 Hyperlipidemia, unspecified: Secondary | ICD-10-CM | POA: Diagnosis not present

## 2016-04-09 DIAGNOSIS — E785 Hyperlipidemia, unspecified: Secondary | ICD-10-CM | POA: Diagnosis not present

## 2016-04-09 DIAGNOSIS — E039 Hypothyroidism, unspecified: Secondary | ICD-10-CM | POA: Diagnosis not present

## 2016-04-09 DIAGNOSIS — G4733 Obstructive sleep apnea (adult) (pediatric): Secondary | ICD-10-CM | POA: Diagnosis not present

## 2016-04-09 DIAGNOSIS — Z7901 Long term (current) use of anticoagulants: Secondary | ICD-10-CM | POA: Diagnosis not present

## 2016-04-09 DIAGNOSIS — I951 Orthostatic hypotension: Secondary | ICD-10-CM | POA: Diagnosis not present

## 2016-04-09 DIAGNOSIS — I480 Paroxysmal atrial fibrillation: Secondary | ICD-10-CM | POA: Diagnosis not present

## 2016-04-09 DIAGNOSIS — Z8673 Personal history of transient ischemic attack (TIA), and cerebral infarction without residual deficits: Secondary | ICD-10-CM | POA: Diagnosis not present

## 2016-04-09 DIAGNOSIS — Z950 Presence of cardiac pacemaker: Secondary | ICD-10-CM | POA: Diagnosis not present

## 2016-04-13 ENCOUNTER — Emergency Department (HOSPITAL_COMMUNITY): Payer: Medicare Other

## 2016-04-13 ENCOUNTER — Other Ambulatory Visit: Payer: Self-pay | Admitting: Internal Medicine

## 2016-04-13 ENCOUNTER — Encounter (HOSPITAL_COMMUNITY): Payer: Self-pay | Admitting: *Deleted

## 2016-04-13 ENCOUNTER — Ambulatory Visit (INDEPENDENT_AMBULATORY_CARE_PROVIDER_SITE_OTHER): Payer: Medicare Other | Admitting: Internal Medicine

## 2016-04-13 ENCOUNTER — Ambulatory Visit (HOSPITAL_COMMUNITY)
Admission: RE | Admit: 2016-04-13 | Discharge: 2016-04-13 | Disposition: A | Discharge status: Home / Self Care | Payer: Medicare Other | Source: Ambulatory Visit | Attending: Internal Medicine | Admitting: Internal Medicine

## 2016-04-13 ENCOUNTER — Encounter: Payer: Self-pay | Admitting: Internal Medicine

## 2016-04-13 VITALS — BP 106/54 | HR 62 | Temp 98.8°F | Resp 16 | Ht 68.0 in | Wt 176.0 lb

## 2016-04-13 DIAGNOSIS — Z8673 Personal history of transient ischemic attack (TIA), and cerebral infarction without residual deficits: Secondary | ICD-10-CM

## 2016-04-13 DIAGNOSIS — J189 Pneumonia, unspecified organism: Principal | ICD-10-CM | POA: Diagnosis present

## 2016-04-13 DIAGNOSIS — G9349 Other encephalopathy: Secondary | ICD-10-CM | POA: Diagnosis present

## 2016-04-13 DIAGNOSIS — R6520 Severe sepsis without septic shock: Secondary | ICD-10-CM | POA: Diagnosis present

## 2016-04-13 DIAGNOSIS — I700 Atherosclerosis of aorta: Secondary | ICD-10-CM | POA: Diagnosis present

## 2016-04-13 DIAGNOSIS — I480 Paroxysmal atrial fibrillation: Secondary | ICD-10-CM | POA: Diagnosis present

## 2016-04-13 DIAGNOSIS — R350 Frequency of micturition: Secondary | ICD-10-CM | POA: Diagnosis not present

## 2016-04-13 DIAGNOSIS — A419 Sepsis, unspecified organism: Secondary | ICD-10-CM | POA: Diagnosis not present

## 2016-04-13 DIAGNOSIS — G319 Degenerative disease of nervous system, unspecified: Secondary | ICD-10-CM | POA: Insufficient documentation

## 2016-04-13 DIAGNOSIS — G2581 Restless legs syndrome: Secondary | ICD-10-CM | POA: Diagnosis present

## 2016-04-13 DIAGNOSIS — Z79899 Other long term (current) drug therapy: Secondary | ICD-10-CM | POA: Diagnosis not present

## 2016-04-13 DIAGNOSIS — I110 Hypertensive heart disease with heart failure: Secondary | ICD-10-CM | POA: Diagnosis not present

## 2016-04-13 DIAGNOSIS — G4733 Obstructive sleep apnea (adult) (pediatric): Secondary | ICD-10-CM | POA: Diagnosis present

## 2016-04-13 DIAGNOSIS — Z7722 Contact with and (suspected) exposure to environmental tobacco smoke (acute) (chronic): Secondary | ICD-10-CM | POA: Diagnosis not present

## 2016-04-13 DIAGNOSIS — M8580 Other specified disorders of bone density and structure, unspecified site: Secondary | ICD-10-CM | POA: Diagnosis present

## 2016-04-13 DIAGNOSIS — R4182 Altered mental status, unspecified: Secondary | ICD-10-CM

## 2016-04-13 DIAGNOSIS — Z950 Presence of cardiac pacemaker: Secondary | ICD-10-CM

## 2016-04-13 DIAGNOSIS — W57XXXA Bitten or stung by nonvenomous insect and other nonvenomous arthropods, initial encounter: Secondary | ICD-10-CM | POA: Diagnosis present

## 2016-04-13 DIAGNOSIS — Z7901 Long term (current) use of anticoagulants: Secondary | ICD-10-CM

## 2016-04-13 DIAGNOSIS — E785 Hyperlipidemia, unspecified: Secondary | ICD-10-CM | POA: Diagnosis present

## 2016-04-13 DIAGNOSIS — I5042 Chronic combined systolic (congestive) and diastolic (congestive) heart failure: Secondary | ICD-10-CM | POA: Diagnosis not present

## 2016-04-13 DIAGNOSIS — E559 Vitamin D deficiency, unspecified: Secondary | ICD-10-CM | POA: Diagnosis present

## 2016-04-13 DIAGNOSIS — J440 Chronic obstructive pulmonary disease with acute lower respiratory infection: Secondary | ICD-10-CM | POA: Diagnosis present

## 2016-04-13 DIAGNOSIS — R509 Fever, unspecified: Secondary | ICD-10-CM | POA: Diagnosis not present

## 2016-04-13 DIAGNOSIS — R410 Disorientation, unspecified: Secondary | ICD-10-CM | POA: Diagnosis not present

## 2016-04-13 DIAGNOSIS — Z85828 Personal history of other malignant neoplasm of skin: Secondary | ICD-10-CM

## 2016-04-13 DIAGNOSIS — Y95 Nosocomial condition: Secondary | ICD-10-CM | POA: Diagnosis present

## 2016-04-13 DIAGNOSIS — E039 Hypothyroidism, unspecified: Secondary | ICD-10-CM | POA: Diagnosis present

## 2016-04-13 LAB — COMPREHENSIVE METABOLIC PANEL
ALBUMIN: 3.5 g/dL (ref 3.5–5.0)
ALT: 13 U/L — ABNORMAL LOW (ref 17–63)
ANION GAP: 8 (ref 5–15)
AST: 16 U/L (ref 15–41)
Alkaline Phosphatase: 57 U/L (ref 38–126)
BUN: 18 mg/dL (ref 6–20)
CHLORIDE: 105 mmol/L (ref 101–111)
CO2: 23 mmol/L (ref 22–32)
Calcium: 8.9 mg/dL (ref 8.9–10.3)
Creatinine, Ser: 1.22 mg/dL (ref 0.61–1.24)
GFR, EST NON AFRICAN AMERICAN: 53 mL/min — AB (ref >60)
Glucose, Bld: 156 mg/dL — ABNORMAL HIGH (ref 65–99)
POTASSIUM: 3.9 mmol/L (ref 3.5–5.1)
Sodium: 136 mmol/L (ref 135–145)
Total Bilirubin: 1 mg/dL (ref 0.3–1.2)
Total Protein: 6.3 g/dL — ABNORMAL LOW (ref 6.5–8.1)

## 2016-04-13 LAB — COMPLETE METABOLIC PANEL WITH GFR
ALT: 8 U/L — AB (ref 9–46)
AST: 11 U/L (ref 10–35)
Albumin: 3.6 g/dL (ref 3.6–5.1)
Alkaline Phosphatase: 57 U/L (ref 40–115)
BILIRUBIN TOTAL: 0.6 mg/dL (ref 0.2–1.2)
BUN: 20 mg/dL (ref 7–25)
CHLORIDE: 105 mmol/L (ref 98–110)
CO2: 25 mmol/L (ref 20–31)
CREATININE: 0.98 mg/dL (ref 0.70–1.11)
Calcium: 8.8 mg/dL (ref 8.6–10.3)
GFR, EST AFRICAN AMERICAN: 82 mL/min (ref >=60)
GFR, Est Non African American: 71 mL/min (ref >=60)
GLUCOSE: 111 mg/dL — AB (ref 65–99)
Potassium: 4.1 mmol/L (ref 3.5–5.3)
SODIUM: 137 mmol/L (ref 135–146)
TOTAL PROTEIN: 5.9 g/dL — AB (ref 6.1–8.1)

## 2016-04-13 LAB — CBC WITH DIFFERENTIAL/PLATELET
BASOS PCT: 0 %
Basophils Absolute: 0 cells/uL (ref 0–200)
EOS ABS: 0 {cells}/uL — AB (ref 15–500)
EOS PCT: 0 %
HCT: 38.7 % (ref 38.5–50.0)
Hemoglobin: 12.7 g/dL — ABNORMAL LOW (ref 13.2–17.1)
LYMPHS PCT: 10 %
Lymphs Abs: 1760 cells/uL (ref 850–3900)
MCH: 29.5 pg (ref 27.0–33.0)
MCHC: 32.8 g/dL (ref 32.0–36.0)
MCV: 89.8 fL (ref 80.0–100.0)
MONOS PCT: 8 %
MPV: 10.2 fL (ref 7.5–12.5)
Monocytes Absolute: 1408 cells/uL — ABNORMAL HIGH (ref 200–950)
NEUTROS ABS: 14432 {cells}/uL — AB (ref 1500–7800)
Neutrophils Relative %: 82 %
PLATELETS: 189 10*3/uL (ref 140–400)
RBC: 4.31 MIL/uL (ref 4.20–5.80)
RDW: 15.1 % — AB (ref 11.0–15.0)
WBC: 17.6 10*3/uL — AB (ref 3.8–10.8)

## 2016-04-13 LAB — CBC
HEMATOCRIT: 37.9 % — AB (ref 39.0–52.0)
HEMOGLOBIN: 12.6 g/dL — AB (ref 13.0–17.0)
MCH: 29.8 pg (ref 26.0–34.0)
MCHC: 33.2 g/dL (ref 30.0–36.0)
MCV: 89.6 fL (ref 78.0–100.0)
Platelets: 167 10*3/uL (ref 150–400)
RBC: 4.23 MIL/uL (ref 4.22–5.81)
RDW: 14.5 % (ref 11.5–15.5)
WBC: 16.3 10*3/uL — AB (ref 4.0–10.5)

## 2016-04-13 LAB — CBG MONITORING, ED: Glucose-Capillary: 169 mg/dL — ABNORMAL HIGH (ref 65–99)

## 2016-04-13 MED ORDER — CIPROFLOXACIN HCL 500 MG PO TABS: 500.0000 mg | ORAL_TABLET | Freq: Two times a day (BID) | ORAL | Status: DC

## 2016-04-13 NOTE — ED Notes (Signed)
Answers some questions accurately others he does not

## 2016-04-13 NOTE — Progress Notes (Signed)
Subjective:    Patient ID: Paul Davies, male    DOB: [birth year], 82 y.o.   MRN: [id number]  HPI  Patient presents to the office for evaluation of increased urination and also increase in falls for the last week. His wife reports that he urinated all night long.  He was very irritable today.  His daughter and his wife reports that he was not willing to come today.  He reports no pain with urinating.  He does reports that he has some more urgency.  He reports that he has some urine streams that are very minimal.  No fevers, but subjectively warm to the touch.  He has had UTI's in the past.  He is followed by Urology. He has fallen half a dozen times.  He fell one time and was unable to get him self up.  He had several unwitnessed falls.   He is currently on coumadin.  Daughter states that the patient was so beligerent this morning that she literally had to come home and put her dad in the car, hold the door closed while his wife got in and use the child lock to keep him in the car to get here.  This is extremely unusual.    Review of Systems  Constitutional: Negative for fever, chills and fatigue.  Respiratory: Negative for chest tightness and shortness of breath.   Cardiovascular: Negative for chest pain and palpitations.  Gastrointestinal: Negative for nausea, vomiting, abdominal pain, diarrhea and constipation.  Genitourinary: Positive for urgency and frequency. Negative for dysuria, hematuria and difficulty urinating.  Skin: Positive for wound.  Psychiatric/Behavioral: Positive for confusion, decreased concentration and agitation.       Objective:   Physical Exam  Constitutional: He appears well-developed and well-nourished. No distress.  HENT:  Head: Normocephalic and atraumatic.  Mouth/Throat: Oropharynx is clear and moist. No oropharyngeal exudate.  Eyes: Conjunctivae are normal. No scleral icterus.  Neck: Normal range of motion. Neck supple. No JVD present. No thyromegaly  present.  Cardiovascular: Normal rate, normal heart sounds and intact distal pulses.  An irregularly irregular rhythm present. Exam reveals no gallop and no friction rub.   No murmur heard. Pulmonary/Chest: Effort normal and breath sounds normal. No respiratory distress. He has no wheezes. He has no rales. He exhibits no tenderness.  Abdominal: Soft. Bowel sounds are normal. He exhibits no distension and no mass. There is no tenderness. There is no rebound and no guarding.  Musculoskeletal: Normal range of motion.  Lymphadenopathy:    He has no cervical adenopathy.  Neurological: He is alert. He displays normal reflexes. No cranial nerve deficit. He exhibits normal muscle tone. Coordination abnormal. GCS eye subscore is 4. GCS verbal subscore is 5. GCS motor subscore is 6.  Oriented to self, place, and wife.  0/3 of word recall.   No sensory deficit.  Difficulty with finger opposition.  Normal finger to nose.  Good motor strength in upper and lower extremities.  Shuffling gait with some feet dragging.  Walks without cane or walker.    Skin: Skin is warm and dry. He is not diaphoretic.  Abrasion to the left elbow  Psychiatric: He has a normal mood and affect.  Nursing note and vitals reviewed.   Filed Vitals:   04/13/16 1032  BP: 106/54  Pulse: 62  Temp: 98.8 F (37.1 C)  Resp: 16         Assessment & Plan:    1. Altered mental status, unspecified altered mental  status type -unwitnessed fall with agitation and changes in mental status from baseline per family members - CT Head Wo Contrast; Future  2. Urinary frequency -possible UTI given urinary frequency -start cipro and will stop if urine culture negative -cut coumadin in half while on cipro - Urinalysis, Reflex Microscopic - Culture, Urine  3. Medication management  - CBC with Differential/Platelet - COMPLETE METABOLIC PANEL WITH GFR  Patient to go to ER for worsening mental status, somnolence, intractable nausea and  vomiting, or any other concerning symptoms.  Daughter and wife agree.  Patient was also instructed to use walker all the time and to rest for the next three days.

## 2016-04-13 NOTE — Patient Instructions (Signed)
Cut Coumadin in half while taking the cipro.  Please use walker all the time until you hear otherwise from me.   Please go straight to the ER if you develop somnolence, further changes in mental status, intractable nausea and vomiting.

## 2016-04-13 NOTE — ED Notes (Signed)
The pt has been falling more frequently last week c/o being tired since last week

## 2016-04-13 NOTE — ED Notes (Signed)
Unable to answer some questions asked  Has difficulty with the day of the week

## 2016-04-13 NOTE — ED Notes (Signed)
Confused for 2 days he saw his doctor today earlier that did lab work  c-t head c/o weakness with a fever and c/o weakness  Spitting up bright red blood earlier today

## 2016-04-14 ENCOUNTER — Inpatient Hospital Stay (HOSPITAL_COMMUNITY)
Admission: EM | Admit: 2016-04-14 | Discharge: 2016-04-16 | DRG: 193 | Disposition: A | Discharge status: Home Health | Payer: Medicare Other | Attending: Internal Medicine | Admitting: Internal Medicine

## 2016-04-14 ENCOUNTER — Encounter (HOSPITAL_COMMUNITY): Payer: Self-pay | Admitting: General Practice

## 2016-04-14 DIAGNOSIS — Z7901 Long term (current) use of anticoagulants: Secondary | ICD-10-CM | POA: Diagnosis not present

## 2016-04-14 DIAGNOSIS — E559 Vitamin D deficiency, unspecified: Secondary | ICD-10-CM | POA: Diagnosis present

## 2016-04-14 DIAGNOSIS — Z950 Presence of cardiac pacemaker: Secondary | ICD-10-CM | POA: Diagnosis not present

## 2016-04-14 DIAGNOSIS — E039 Hypothyroidism, unspecified: Secondary | ICD-10-CM | POA: Diagnosis present

## 2016-04-14 DIAGNOSIS — J440 Chronic obstructive pulmonary disease with acute lower respiratory infection: Secondary | ICD-10-CM | POA: Diagnosis present

## 2016-04-14 DIAGNOSIS — J449 Chronic obstructive pulmonary disease, unspecified: Secondary | ICD-10-CM

## 2016-04-14 DIAGNOSIS — I480 Paroxysmal atrial fibrillation: Secondary | ICD-10-CM | POA: Diagnosis present

## 2016-04-14 DIAGNOSIS — T148 Other injury of unspecified body region: Secondary | ICD-10-CM

## 2016-04-14 DIAGNOSIS — G9349 Other encephalopathy: Secondary | ICD-10-CM | POA: Diagnosis present

## 2016-04-14 DIAGNOSIS — G301 Alzheimer's disease with late onset: Secondary | ICD-10-CM

## 2016-04-14 DIAGNOSIS — E785 Hyperlipidemia, unspecified: Secondary | ICD-10-CM | POA: Diagnosis present

## 2016-04-14 DIAGNOSIS — R6520 Severe sepsis without septic shock: Secondary | ICD-10-CM

## 2016-04-14 DIAGNOSIS — A419 Sepsis, unspecified organism: Secondary | ICD-10-CM

## 2016-04-14 DIAGNOSIS — W57XXXA Bitten or stung by nonvenomous insect and other nonvenomous arthropods, initial encounter: Secondary | ICD-10-CM

## 2016-04-14 DIAGNOSIS — J189 Pneumonia, unspecified organism: Secondary | ICD-10-CM | POA: Diagnosis not present

## 2016-04-14 DIAGNOSIS — Z85828 Personal history of other malignant neoplasm of skin: Secondary | ICD-10-CM | POA: Diagnosis not present

## 2016-04-14 DIAGNOSIS — I5042 Chronic combined systolic (congestive) and diastolic (congestive) heart failure: Secondary | ICD-10-CM

## 2016-04-14 DIAGNOSIS — G300 Alzheimer's disease with early onset: Secondary | ICD-10-CM | POA: Diagnosis not present

## 2016-04-14 DIAGNOSIS — M8580 Other specified disorders of bone density and structure, unspecified site: Secondary | ICD-10-CM | POA: Diagnosis present

## 2016-04-14 DIAGNOSIS — G2581 Restless legs syndrome: Secondary | ICD-10-CM | POA: Diagnosis present

## 2016-04-14 DIAGNOSIS — I110 Hypertensive heart disease with heart failure: Secondary | ICD-10-CM | POA: Diagnosis present

## 2016-04-14 DIAGNOSIS — Y95 Nosocomial condition: Secondary | ICD-10-CM | POA: Diagnosis present

## 2016-04-14 DIAGNOSIS — R410 Disorientation, unspecified: Secondary | ICD-10-CM

## 2016-04-14 DIAGNOSIS — G4733 Obstructive sleep apnea (adult) (pediatric): Secondary | ICD-10-CM | POA: Diagnosis present

## 2016-04-14 DIAGNOSIS — G9340 Encephalopathy, unspecified: Secondary | ICD-10-CM | POA: Diagnosis not present

## 2016-04-14 DIAGNOSIS — F0280 Dementia in other diseases classified elsewhere without behavioral disturbance: Secondary | ICD-10-CM | POA: Diagnosis present

## 2016-04-14 DIAGNOSIS — I700 Atherosclerosis of aorta: Secondary | ICD-10-CM | POA: Diagnosis present

## 2016-04-14 DIAGNOSIS — R4182 Altered mental status, unspecified: Secondary | ICD-10-CM | POA: Diagnosis not present

## 2016-04-14 HISTORY — DX: Sepsis, unspecified organism: A41.9

## 2016-04-14 LAB — URINE MICROSCOPIC-ADD ON

## 2016-04-14 LAB — CBC WITH DIFFERENTIAL/PLATELET
BASOS ABS: 0 10*3/uL (ref 0.0–0.1)
Basophils Relative: 0 %
Eosinophils Absolute: 0 10*3/uL (ref 0.0–0.7)
Eosinophils Relative: 0 %
HEMATOCRIT: 35.9 % — AB (ref 39.0–52.0)
Hemoglobin: 11.7 g/dL — ABNORMAL LOW (ref 13.0–17.0)
LYMPHS ABS: 1.8 10*3/uL (ref 0.7–4.0)
LYMPHS PCT: 11 %
MCH: 29.1 pg (ref 26.0–34.0)
MCHC: 32.6 g/dL (ref 30.0–36.0)
MCV: 89.3 fL (ref 78.0–100.0)
MONO ABS: 1.3 10*3/uL — AB (ref 0.1–1.0)
Monocytes Relative: 8 %
NEUTROS ABS: 12.7 10*3/uL — AB (ref 1.7–7.7)
Neutrophils Relative %: 81 %
Platelets: 147 10*3/uL — ABNORMAL LOW (ref 150–400)
RBC: 4.02 MIL/uL — ABNORMAL LOW (ref 4.22–5.81)
RDW: 14.5 % (ref 11.5–15.5)
WBC: 15.8 10*3/uL — ABNORMAL HIGH (ref 4.0–10.5)

## 2016-04-14 LAB — URINALYSIS, ROUTINE W REFLEX MICROSCOPIC
Bilirubin Urine: NEGATIVE
Bilirubin Urine: NEGATIVE
GLUCOSE, UA: NEGATIVE mg/dL
Glucose, UA: NEGATIVE
Ketones, ur: NEGATIVE mg/dL
LEUKOCYTES UA: NEGATIVE
Leukocytes, UA: NEGATIVE
NITRITE: NEGATIVE
Nitrite: NEGATIVE
PH: 5.5 (ref 5.0–8.0)
PH: 5.5 (ref 5.0–8.0)
Protein, ur: NEGATIVE
Protein, ur: NEGATIVE mg/dL
SPECIFIC GRAVITY, URINE: 1.026 (ref 1.005–1.030)
Specific Gravity, Urine: 1.029 (ref 1.001–1.035)

## 2016-04-14 LAB — URINALYSIS, MICROSCOPIC ONLY
BACTERIA UA: NONE SEEN [HPF]
Casts: NONE SEEN [LPF]
Crystals: NONE SEEN [HPF]
SQUAMOUS EPITHELIAL / LPF: NONE SEEN [HPF] (ref <=5)
WBC UA: NONE SEEN WBC/HPF (ref <=5)
Yeast: NONE SEEN [HPF]

## 2016-04-14 LAB — PROTIME-INR
INR: 2.25 — AB (ref 0.00–1.49)
INR: 2.34 — ABNORMAL HIGH (ref 0.00–1.49)
PROTHROMBIN TIME: 25.4 s — AB (ref 11.6–15.2)
Prothrombin Time: 24.7 seconds — ABNORMAL HIGH (ref 11.6–15.2)

## 2016-04-14 LAB — APTT: aPTT: 38 seconds — ABNORMAL HIGH (ref 24–37)

## 2016-04-14 LAB — STREP PNEUMONIAE URINARY ANTIGEN: Strep Pneumo Urinary Antigen: NEGATIVE

## 2016-04-14 LAB — PROCALCITONIN: PROCALCITONIN: 1.18 ng/mL

## 2016-04-14 LAB — I-STAT CG4 LACTIC ACID, ED
LACTIC ACID, VENOUS: 1.54 mmol/L (ref 0.5–2.0)
LACTIC ACID, VENOUS: 1.45 mmol/L (ref 0.5–2.0)

## 2016-04-14 MED ORDER — DEXTROSE 5 % IV SOLN
2.0000 g | INTRAVENOUS | Status: DC
  Administered 2016-04-15 – 2016-04-16 (×2): 2 g via INTRAVENOUS
  Filled 2016-04-14 (×2): qty 2

## 2016-04-14 MED ORDER — LEVOTHYROXINE SODIUM 50 MCG PO TABS
50.0000 ug | ORAL_TABLET | Freq: Every day | ORAL | Status: DC
  Administered 2016-04-14 – 2016-04-16 (×3): 50 ug via ORAL
  Filled 2016-04-14 (×3): qty 1

## 2016-04-14 MED ORDER — AMIODARONE HCL 200 MG PO TABS
200.0000 mg | ORAL_TABLET | Freq: Two times a day (BID) | ORAL | Status: DC
  Administered 2016-04-14 – 2016-04-16 (×5): 200 mg via ORAL
  Filled 2016-04-14 (×5): qty 1

## 2016-04-14 MED ORDER — SODIUM CHLORIDE 0.9 % IV SOLN
INTRAVENOUS | Status: DC
  Administered 2016-04-14: 09:00:00 via INTRAVENOUS

## 2016-04-14 MED ORDER — WARFARIN - PHARMACIST DOSING INPATIENT
Freq: Every day | Status: DC
  Administered 2016-04-14 – 2016-04-15 (×2)

## 2016-04-14 MED ORDER — WARFARIN SODIUM 4 MG PO TABS
4.0000 mg | ORAL_TABLET | Freq: Every day | ORAL | Status: DC
  Administered 2016-04-14: 4 mg via ORAL
  Filled 2016-04-14 (×2): qty 1

## 2016-04-14 MED ORDER — DOXYCYCLINE HYCLATE 100 MG IV SOLR
100.0000 mg | Freq: Two times a day (BID) | INTRAVENOUS | Status: DC
  Administered 2016-04-14 – 2016-04-16 (×5): 100 mg via INTRAVENOUS
  Filled 2016-04-14 (×6): qty 100

## 2016-04-14 MED ORDER — CEFEPIME HCL 2 G IJ SOLR
2.0000 g | Freq: Once | INTRAMUSCULAR | Status: AC
  Administered 2016-04-14: 2 g via INTRAVENOUS
  Filled 2016-04-14: qty 2

## 2016-04-14 MED ORDER — VANCOMYCIN HCL IN DEXTROSE 750-5 MG/150ML-% IV SOLN
750.0000 mg | Freq: Two times a day (BID) | INTRAVENOUS | Status: DC
  Administered 2016-04-14 – 2016-04-16 (×4): 750 mg via INTRAVENOUS
  Filled 2016-04-14 (×5): qty 150

## 2016-04-14 MED ORDER — VANCOMYCIN HCL IN DEXTROSE 1-5 GM/200ML-% IV SOLN
1000.0000 mg | Freq: Once | INTRAVENOUS | Status: AC
  Administered 2016-04-14: 1000 mg via INTRAVENOUS
  Filled 2016-04-14: qty 200

## 2016-04-14 MED ORDER — MEMANTINE HCL ER 28 MG PO CP24
28.0000 mg | ORAL_CAPSULE | Freq: Every day | ORAL | Status: DC
  Administered 2016-04-14 – 2016-04-16 (×3): 28 mg via ORAL
  Filled 2016-04-14 (×3): qty 1

## 2016-04-14 MED ORDER — DEXTROSE 5 % IV SOLN: 1.0000 g | Freq: Three times a day (TID) | INTRAVENOUS | Status: DC

## 2016-04-14 NOTE — Discharge Instructions (Signed)

## 2016-04-14 NOTE — Progress Notes (Signed)
Progress Note    Paul Davies  [id number] DOB: [birth year]  DOA: 04/14/2016 PCP: Alesia Richards, MD    Brief Narrative:   Paul Davies is an 82 y.o. male with multiple medical problems including dementia, prior CVA, paroxysmal atrial fibrillation (anticoagulated with warfarin), CHF (combined systolic and diastolic), COPD, OSA (noncompliant with CPAP), and HTN Who was admitted 04/14/16 with altered mental status secondary to sepsis from HCAP.  Assessment/Plan:   Principal Problem:   Sepsis secondary to HCAP (healthcare-associated pneumonia) Patient was admitted and placed on vancomycin and cefepime with doxycycline added due to concerns for possible tickborne illness. He is being cautiously hydrated given his history of CHF. Follow-up sputum and blood cultures. Follow-up urine Legionella and streptococcal antigens. Pro calcitonin 1.18.  Active Problems:   Paroxysmal atrial fibrillation (HCC)/Current use of long term anticoagulation with cardiac pacemaker in situ Continue amiodarone and Coumadin.    Sleep apnea in adult /COPD (chronic obstructive pulmonary disease) (HCC) Monitor respiratory status closely.    Chronic combined systolic and diastolic congestive heart failure (HCC) Monitor daily weights and I/O.      Tick bite Follow-up Borrelia PCR, Ehrlichia and RMSF antibody panel. Continue empiric doxycycline for now.    Acute encephalopathy/dementia Appears to be secondary to sepsis/infection. Monitor for return of dental status to baseline. Continue Namenda.    Hypothyroidism Continue Synthroid.   Family Communication/Anticipated D/C date and plan/Code Status   DVT prophylaxis: Lovenox ordered. Code Status: Full Code.  Family Communication: Wife at the bedside. Disposition Plan: Home in 2-3 days if respiratory status stable and mental status clear.   Medical Consultants:    None.   Procedures:    Anti-Infectives:   Vancomycin  04/14/16---> Cefepime 04/14/16---> Doxycycline 04/14/16--->  Subjective:    Paul Davies is sleepy but opens his eyes and answers questions appropriately. He is coughing a lot and is a bit short of breath. No chest pain. Multiple family at the bedside including his wife say that he works out in the yard a lot and has ticks on him all the time.  Objective:    Filed Vitals:   04/14/16 0615 04/14/16 0630 04/14/16 0705 04/14/16 0707  BP: 111/63 120/65  123/60  Pulse: 65 68  71  Temp:    97.9 F (36.6 C)  TempSrc:      Resp: 22 20  18   Height:   5' 9.6" (1.768 m)   Weight:   73.755 kg (162 lb 9.6 oz)   SpO2: 96% 99%  99%    Intake/Output Summary (Last 24 hours) at 04/14/16 1027 Last data filed at 04/14/16 0839  Gross per 24 hour  Intake      0 ml  Output    200 ml  Net   -200 ml   Filed Weights   04/13/16 2141 04/14/16 0705  Weight: 77.111 kg (170 lb) 73.755 kg (162 lb 9.6 oz)    Exam: General exam: Sleepy. Respiratory system: Clear to auscultation. Respiratory effort normal. Bilateral rhonchi, worse on left. Cardiovascular system: S1 & S2 heard, RRR. No JVD,  rubs, gallops or clicks. No murmurs. Gastrointestinal system: Abdomen is nondistended, soft and nontender. No organomegaly or masses felt. Normal bowel sounds heard. Central nervous system: Alert and oriented. No focal neurological deficits. Extremities: No clubbing, edema, or cyanosis. Skin: No rashes, lesions or ulcers Psychiatry: Judgement and insight appear normal. Mood & affect appropriate.   Data Reviewed:   I have personally reviewed following labs and  imaging studies:  Labs: Basic Metabolic Panel:  Recent Labs Lab 04/13/16 1118 04/13/16 2157  NA 137 136  K 4.1 3.9  CL 105 105  CO2 25 23  GLUCOSE 111* 156*  BUN 20 18  CREATININE 0.98 1.22  CALCIUM 8.8 8.9   GFR Estimated Creatinine Clearance: 46.8 mL/min (by C-G formula based on Cr of 1.22). Liver Function Tests:  Recent Labs Lab  04/13/16 1118 04/13/16 2157  AST 11 16  ALT 8* 13*  ALKPHOS 57 57  BILITOT 0.6 1.0  PROT 5.9* 6.3*  ALBUMIN 3.6 3.5   Coagulation profile  Recent Labs Lab 04/14/16 0534 04/14/16 0729  INR 2.25* 2.34*    CBC:  Recent Labs Lab 04/13/16 1118 04/13/16 2157 04/14/16 0323  WBC 17.6* 16.3* 15.8*  NEUTROABS 14432*  --  12.7*  HGB 12.7* 12.6* 11.7*  HCT 38.7 37.9* 35.9*  MCV 89.8 89.6 89.3  PLT 189 167 147*   CBG:  Recent Labs Lab 04/13/16 2343  GLUCAP 169*   Sepsis Labs:  Recent Labs Lab 04/13/16 1118 04/13/16 2157 04/14/16 0323 04/14/16 0349 04/14/16 0602 04/14/16 0729  PROCALCITON  --   --   --   --   --  1.18  WBC 17.6* 16.3* 15.8*  --   --   --   LATICACIDVEN  --   --   --  1.54 1.45  --    Urine analysis:    Component Value Date/Time   COLORURINE AMBER* 04/14/2016 0449   APPEARANCEUR CLOUDY* 04/14/2016 0449   LABSPEC 1.026 04/14/2016 0449   PHURINE 5.5 04/14/2016 0449   GLUCOSEU NEGATIVE 04/14/2016 0449   HGBUR SMALL* 04/14/2016 [place] 04/14/2016 [place] 04/14/2016 0449   PROTEINUR NEGATIVE 04/14/2016 0449   UROBILINOGEN 0.2 12/19/2014 1136   NITRITE NEGATIVE 04/14/2016 0449   LEUKOCYTESUR NEGATIVE 04/14/2016 0449   Microbiology No results found for this or any previous visit (from the past 240 hour(s)).  Radiology: Dg Chest 2 View  04/13/2016  CLINICAL DATA:  Acute onset of fever and hemoptysis. Generalized weakness and altered mental status. EXAM: CHEST  2 VIEW COMPARISON:  Chest radiograph performed 02/15/2016 FINDINGS: The lungs are well-aerated. Left basilar airspace opacity is compatible with pneumonia. The lungs are mildly hypoexpanded. No definite pleural effusion or pneumothorax is seen. The heart is borderline normal in size. A pacemaker is noted at the left chest wall, with leads ending at the right atrium and right ventricle. No acute osseous abnormalities are seen. IMPRESSION: Left basilar  airspace opacity is compatible with pneumonia. Lungs mildly hypoexpanded. Electronically Signed   By: Garald Balding M.D.   On: 04/13/2016 23:37   Ct Head Wo Contrast  04/13/2016  CLINICAL DATA:  Confusion and irritability EXAM: CT HEAD WITHOUT CONTRAST TECHNIQUE: Contiguous axial images were obtained from the base of the skull through the vertex without intravenous contrast. COMPARISON:  05/19/2005 FINDINGS: The bony calvarium is intact. Diffuse atrophic changes are noted. There are changes consistent with prior infarct in the distribution of the right middle cerebral artery. No findings to suggest acute hemorrhage, acute infarction or space-occupying mass lesion are noted. IMPRESSION: Atrophic changes and prior right middle cerebral artery infarct which are stable. No acute abnormality noted. Electronically Signed   By: Inez Catalina M.D.   On: 04/13/2016 15:20    Medications:   . amiodarone  200 mg Oral BID  . [START ON 04/15/2016] ceFEPime (MAXIPIME) IV  2 g Intravenous Q24H  .  doxycycline (VIBRAMYCIN) IV  100 mg Intravenous Q12H  . levothyroxine  50 mcg Oral QAC breakfast  . memantine  28 mg Oral Daily  . vancomycin  750 mg Intravenous Q12H  . warfarin  4 mg Oral q1800  . Warfarin - Pharmacist Dosing Inpatient   Does not apply q1800   Continuous Infusions: . sodium chloride 100 mL/hr at 04/14/16 [id number]    Time spent: 35 minutes with > 50% of time discussing current diagnostic test results, clinical impression and plan of care.   LOS: 0 days   RAMA,CHRISTINA  [facility] Pager [phone]. If unable to reach me by pager, please call my cell phone at [phone].  *Please refer to [email or web address], password [email or web address] to get updated schedule on who will round on this patient, as hospitalists switch teams weekly. If 7PM-7AM, please contact night-coverage at [email or web address], password [email or web address] for any overnight needs.  04/14/2016, 10:27 AM

## 2016-04-14 NOTE — Progress Notes (Signed)
ANTICOAGULATION/ANTICOAGULATION CONSULT NOTE - Initial Consult  Pharmacy Consult for Vancomycin, Cefepime, and Coumadin Indication: HCAP and afib  Allergies  Allergen Reactions  . Bee Venom Anaphylaxis    Patient Measurements: Weight: 170 lb (77.111 kg)   Vital Signs: Temp: 99 F (37.2 C) (05/23 0232) Temp Source: Oral (05/23 0232) BP: 117/51 mmHg (05/23 0232) Pulse Rate: 68 (05/23 0232)  Labs:  Recent Labs  04/13/16 1118 04/13/16 2157 04/14/16 0323  HGB 12.7* 12.6* 11.7*  HCT 38.7 37.9* 35.9*  PLT 189 167 147*  CREATININE 0.98 1.22  --     Estimated Creatinine Clearance: 44.4 mL/min (by C-G formula based on Cr of 1.22).   Medical History: Past Medical History  Diagnosis Date  . Hyperlipidemia   . Hypertension   . RLS (restless legs syndrome)   . Osteopenia   . Hypothyroidism   . Vitamin D deficiency   . Pacemaker medtronic   . Atherosclerosis of aorta ([place]) 2010    noted on lumbar CT in 2010  . COPD (chronic obstructive pulmonary disease) ([place])     On CXR  . Sinoatrial node dysfunction (HCC)   . Prediabetes   . Atrial fibrillation ([place])   . Basal cell carcinoma, face   . History of right MCA stroke ~ 2002; 2006  . Hypothyroidism   . Pneumonia 01/28/2016  . OSA on CPAP   . DJD (degenerative joint disease)   . Chronic sinusitis     Medications:  See electronic med rec  Assessment: 82 y.o. M presents with SOB, confusion, and fever.   AC: Pt on coumadin PTA for afib. Admit INR 2.24 - therapeutic. Hgb 11.7, plt 147 on admission. Home dose 4mg  daily - last taken 5/21  ID: Pharmacy consulted for Vancomycin and Cefepime for HCAP. Pt received Vanc 1gm and Cefepime 2gm IV in ED ~0400. WBC elevated to 15.8. LA wnl. Pt took one dose of Cipro today for suspected UTI.  5/23 Vanc>> 5/23 Cefepime>>  5/23 BCx x2>> 5/23 UCx>>  Goal of Therapy:  INR 2-3; Vancomycin trough 15-20 mcg/ml Monitor platelets by anticoagulation protocol: Yes   Plan:  Cefepime  2gm IV q24h Vancomycin 750mg  IV q12h Will f/u micro data, renal function, and pt's clinical condition Vanc trough prn Daily INR Coumadin 4mg  q1800  Sherlon Handing, PharmD, BCPS Clinical pharmacist, pager [phone] 04/14/2016,5:04 AM

## 2016-04-14 NOTE — ED Provider Notes (Signed)
CSN: [id number]     Arrival date & time 04/13/16  2117 History  By signing my name below, I, Paul Davies, attest that this documentation has been prepared under the direction and in the presence of Varney Biles, MD . Electronically Signed: Rowan Davies, Scribe. 04/14/2016. 2:56 AM.   Chief Complaint  Patient presents with  . Altered Mental Status   The history is provided by the patient, the spouse and a relative. No language interpreter was used.   HPI Comments:  Paul Davies is a 82 y.o. male with PMHx of HLD, HTN, COPD and basal cell carcinoma who presents to the Emergency Department complaining of increased breathing rate, wheezing, and persistent cough productive of yellow mucous occassionally streaked with blood. Family reports the pt was working outside 4 days ago, then slept all day the next day. Family reports he has been increasingly fatigued and gets short of breath when walking. Family also states pt was disoriented last night; he was getting out of bed frequently to use the bathroom and to work outside in the middle of the night. Pt had a fever of 101 today and chills last night. No alleviating factors noted. Pt saw PCP this morning and was prescribed Cipro for suspected UTI. Denies chest pain, dysuria, hematuria or frequency.  Past Medical History  Diagnosis Date  . Hyperlipidemia   . Hypertension   . RLS (restless legs syndrome)   . Osteopenia   . Hypothyroidism   . Vitamin D deficiency   . Pacemaker medtronic   . Atherosclerosis of aorta ([place]) 2010    noted on lumbar CT in 2010  . COPD (chronic obstructive pulmonary disease) ([place])     On CXR  . Sinoatrial node dysfunction (HCC)   . Prediabetes   . Atrial fibrillation ([place])   . Basal cell carcinoma, face   . History of right MCA stroke ~ 2002; 2006  . Hypothyroidism   . Pneumonia 01/28/2016  . OSA on CPAP   . DJD (degenerative joint disease)   . Chronic sinusitis    Past Surgical History  Procedure  Laterality Date  . Pacemaker generator change N/A 05/04/2014    Procedure: PACEMAKER GENERATOR CHANGE;  Surgeon: [name], MD;  Location: [facility] CATH LAB;  Service: Cardiovascular;  Laterality: N/A;  . Shoulder open rotator cuff repair Right 02/2008  . Basal cell carcinoma excision      "face"  . Insert / replace / remove pacemaker  04/2005  . Cardiac catheterization  02/2004    normal coronary arteries  . Transurethral resection of prostate     Family History  Problem Relation Age of Onset  . Heart attack Father   . Heart attack Brother 41  . Stroke Sister   . Dementia Sister    Social History  Substance Use Topics  . Smoking status: Passive Smoke Exposure - Never Smoker  . Smokeless tobacco: Never Used  . Alcohol Use: No    Review of Systems  10 systems reviewed and all are negative for acute change except as noted in the HPI.  Allergies  Bee venom  Home Medications   Prior to Admission medications   Medication Sig Start Date End Date Taking? Authorizing Provider  amiodarone (PACERONE) 200 MG tablet Take 1 tablet (200 mg total) by mouth 2 (two) times daily. 01/31/16  Yes Jacolyn Reedy, MD  cholecalciferol (VITAMIN D) 1000 UNITS tablet Take 2,000 Units by mouth daily.    Yes Historical Provider, MD  ciprofloxacin (CIPRO) 500 MG tablet Take 1 tablet (500 mg total) by mouth 2 (two) times daily. 04/13/16 04/16/16 Yes Courtney Forcucci, PA-C  hydrocortisone 2.5 % cream Apply 1 application topically daily as needed (inflammation on face). To face   Yes Historical Provider, MD  levothyroxine (SYNTHROID, LEVOTHROID) 50 MCG tablet Take 1 tablet (50 mcg total) by mouth daily before breakfast. 12/24/15  Yes Unk Pinto, MD  NAMENDA XR 28 MG CP24 24 hr capsule TAKE 1 CAPSULE DAILY 03/12/16  Yes Garvin Fila, MD  warfarin (COUMADIN) 4 MG tablet Take 4 mg by mouth daily.   Yes Historical Provider, MD   BP 117/51 mmHg  Pulse 68  Temp(Src) 99 F (37.2 C) (Oral)  Resp 27  Wt 170  lb (77.111 kg)  SpO2 92%   Physical Exam  Constitutional: He appears well-developed and well-nourished. No distress.  HENT:  Head: Normocephalic and atraumatic.  Mouth/Throat: Oropharynx is clear and moist. No oropharyngeal exudate.  Eyes: Conjunctivae and EOM are normal. Pupils are equal, round, and reactive to light. Right eye exhibits no discharge. Left eye exhibits no discharge. No scleral icterus.  Neck: Normal range of motion. Neck supple. No JVD present. No thyromegaly present.  Cardiovascular: Normal rate, regular rhythm and intact distal pulses.  Exam reveals no gallop and no friction rub.   Murmur heard. Pulmonary/Chest: Effort normal. No respiratory distress. He has wheezes. He has rales.  BL basilar rales; mild wheezing  Abdominal: Soft. Bowel sounds are normal. He exhibits no distension and no mass. There is no tenderness.  Musculoskeletal: Normal range of motion. He exhibits no edema or tenderness.  No pitting edema; no unilateral leg swelling  Lymphadenopathy:    He has no cervical adenopathy.  Neurological: He is alert. Coordination normal.  Skin: Skin is warm and dry. No rash noted. No erythema.  Psychiatric: He has a normal mood and affect. His behavior is normal.  Nursing note and vitals reviewed.   ED Course  Procedures  DIAGNOSTIC STUDIES:  Oxygen Saturation is 92% on RA, low by my interpretation.    COORDINATION OF CARE:  2:53 AM Informed family of plan to admit pt. Will administer antibiotics. Discussed treatment plan with pt at bedside and pt agreed to plan.  Labs Review Labs Reviewed  COMPREHENSIVE METABOLIC PANEL - Abnormal; Notable for the following:    Glucose, Bld 156 (*)    Total Protein 6.3 (*)    ALT 13 (*)    GFR calc non Af Amer 53 (*)    All other components within normal limits  CBC - Abnormal; Notable for the following:    WBC 16.3 (*)    Hemoglobin 12.6 (*)    HCT 37.9 (*)    All other components within normal limits  CBC WITH  DIFFERENTIAL/PLATELET - Abnormal; Notable for the following:    WBC 15.8 (*)    RBC 4.02 (*)    Hemoglobin 11.7 (*)    HCT 35.9 (*)    Platelets 147 (*)    Neutro Abs 12.7 (*)    Monocytes Absolute 1.3 (*)    All other components within normal limits  CBG MONITORING, ED - Abnormal; Notable for the following:    Glucose-Capillary 169 (*)    All other components within normal limits  URINE CULTURE  CULTURE, BLOOD (ROUTINE X 2)  CULTURE, BLOOD (ROUTINE X 2)  URINALYSIS, ROUTINE W REFLEX MICROSCOPIC (NOT AT [facility])  EHRLICHIA ANTIBODY PANEL  LYME DISEASE DNA BY PCR(BORRELIA BURG)  ROCKY MTN SPOTTED FVR ABS PNL(IGG+IGM)  I-STAT CG4 LACTIC ACID, ED    Imaging Review Dg Chest 2 View  04/13/2016  CLINICAL DATA:  Acute onset of fever and hemoptysis. Generalized weakness and altered mental status. EXAM: CHEST  2 VIEW COMPARISON:  Chest radiograph performed 02/15/2016 FINDINGS: The lungs are well-aerated. Left basilar airspace opacity is compatible with pneumonia. The lungs are mildly hypoexpanded. No definite pleural effusion or pneumothorax is seen. The heart is borderline normal in size. A pacemaker is noted at the left chest wall, with leads ending at the right atrium and right ventricle. No acute osseous abnormalities are seen. IMPRESSION: Left basilar airspace opacity is compatible with pneumonia. Lungs mildly hypoexpanded. Electronically Signed   By: Garald Balding M.D.   On: 04/13/2016 23:37   Ct Head Wo Contrast  04/13/2016  CLINICAL DATA:  Confusion and irritability EXAM: CT HEAD WITHOUT CONTRAST TECHNIQUE: Contiguous axial images were obtained from the base of the skull through the vertex without intravenous contrast. COMPARISON:  05/19/2005 FINDINGS: The bony calvarium is intact. Diffuse atrophic changes are noted. There are changes consistent with prior infarct in the distribution of the right middle cerebral artery. No findings to suggest acute hemorrhage, acute infarction or  space-occupying mass lesion are noted. IMPRESSION: Atrophic changes and prior right middle cerebral artery infarct which are stable. No acute abnormality noted. Electronically Signed   By: Inez Catalina M.D.   On: 04/13/2016 15:20   I have personally reviewed and evaluated these images and lab results as part of my medical decision-making.   EKG Interpretation   Date/Time:  Monday Apr 13 2016 22:01:58 EDT Ventricular Rate:  63 PR Interval:  108 QRS Duration: 88 QT Interval:  444 QTC Calculation: 454 R Axis:   29 Text Interpretation:  Sinus rhythm with short PR Nonspecific T wave  abnormality Abnormal ECG Confirmed by HORTON  MD, [place] ([id number]) on  04/14/2016 1:29:23 AM      MDM   Final diagnoses:  HCAP (healthcare-associated pneumonia)  Confusion  Severe sepsis ([place])    I personally performed the services described in this documentation, which was scribed in my presence. The recorded information has been reviewed and is accurate.  Pt comes in with cc of DIB, confusion. C/o cough, with dib. Also has polyuria. Hx of CAP - admission was < 90 days ago. Subjective fevers and chills at home. PT has elevated WC and CXR shows pneumonia - which is consistent with the clinical diagnosis.  At 2:30 - pt developed 2nd SIRS of RR > 20. Code sepsis called, and blood cultures, lactate, antibiotics ordered. Lactate < 2. Still, with confusion, will tx as severe sepsis and HCAP.     Varney Biles, MD 04/14/16 [phone]

## 2016-04-14 NOTE — H&P (Signed)
History and Physical    Paul Davies [id number] DOB: July 20, 1930 DOA: 04/14/2016  PCP: Alesia Richards, MD  Patient coming from: Home  Chief Complaint: Altered mental status  HPI: Paul Davies is a 82 y.o. gentleman with multiple medical problems including dementia, prior CVA, paroxysmal atrial fibrillation (anticoagulated with warfarin), CHF (combined systolic and diastolic), COPD, OSA (noncompliant with CPAP), and HTN who is accompanied by his wife and daughter tonight.  Family reports altered mental status, with increased lethargy (sleeping most of the day for the past two days), decreased PO intake, weakness, and intermittent confusion for several days.  The patient worked outside most of the last week and into the weekend.  His wife reports that they have removed several ticks, but she has not seen new rash.  Today, the patient had a fever to 101.  He also developed productive cough, with blood-tinged sputum.  He has had chills.  No nausea, vomiting, or diarrhea.  He actually saw his PCP earlier today and complained of urinary frequency at that time.  UTI was high on the differential, and he was referred for urine studies and labs.  He received a prescription for cipro.    ED Course: The patient has had waxing and waning mental status in the ED though he is currently arousable and oriented for me.  Evaluation concerning for early sepsis with reported fever (Tmax in ED 99), leukocytosis, mental status changes, and chest xray concerning for LLL infiltrate.  The patient was hospitalized within the past 90 days, so he is being treated as HCAP.  Hospitalist asked to admit.  Review of Systems: As per HPI otherwise 10 point review of systems negative.   Past Medical History  Diagnosis Date  . Hyperlipidemia   . Hypertension   . RLS (restless legs syndrome)   . Osteopenia   . Hypothyroidism   . Vitamin D deficiency   . Pacemaker medtronic   . Atherosclerosis of aorta ([place]) 2010      noted on lumbar CT in 2010  . COPD (chronic obstructive pulmonary disease) ([place])     On CXR  . Sinoatrial node dysfunction (HCC)   . Prediabetes   . Atrial fibrillation ([place])   . Basal cell carcinoma, face   . History of right MCA stroke ~ 2002; 2006  . Hypothyroidism   . Pneumonia 01/28/2016  . OSA on CPAP   . DJD (degenerative joint disease)   . Chronic sinusitis     Past Surgical History  Procedure Laterality Date  . Pacemaker generator change N/A 05/04/2014    Procedure: PACEMAKER GENERATOR CHANGE;  Surgeon: [name], MD;  Location: [facility] C[facility] LAB;  Service: Cardiovascular;  Laterality: N/A;  . Shoulder open rotator cuff repair Right 02/2008  . Basal cell carcinoma excision      "face"  . Insert / replace / remove pacemaker  04/2005  . Cardiac catheterization  02/2004    normal coronary arteries  . Transurethral resection of prostate       reports that he has been passively smoking.  He has never used smokeless tobacco. He reports that he does not drink alcohol or use illicit drugs.  He is married.  Allergies  Allergen Reactions  . Bee Venom Anaphylaxis    Family History  Problem Relation Age of Onset  . Heart attack Father   . Heart attack Brother 60  . Stroke Sister   . Dementia Sister    Prior to Admission medications  Medication Sig Start Date End Date Taking? Authorizing Provider  amiodarone (PACERONE) 200 MG tablet Take 1 tablet (200 mg total) by mouth 2 (two) times daily. 01/31/16  Yes Jacolyn Reedy, MD  cholecalciferol (VITAMIN D) 1000 UNITS tablet Take 2,000 Units by mouth daily.    Yes Historical Provider, MD  ciprofloxacin (CIPRO) 500 MG tablet Take 1 tablet (500 mg total) by mouth 2 (two) times daily. 04/13/16 04/16/16 Yes Courtney Forcucci, PA-C  hydrocortisone 2.5 % cream Apply 1 application topically daily as needed (inflammation on face). To face   Yes Historical Provider, MD  levothyroxine (SYNTHROID, LEVOTHROID) 50 MCG tablet Take 1 tablet (50  mcg total) by mouth daily before breakfast. 12/24/15  Yes Unk Pinto, MD  NAMENDA XR 28 MG CP24 24 hr capsule TAKE 1 CAPSULE DAILY 03/12/16  Yes Garvin Fila, MD  warfarin (COUMADIN) 4 MG tablet Take 4 mg by mouth daily.   Yes Historical Provider, MD    Physical Exam: Filed Vitals:   04/13/16 2141 04/14/16 0232  BP: 111/56 117/51  Pulse: 64 68  Temp: 99.1 F (37.3 C) 99 F (37.2 C)  TempSrc:  Oral  Resp: 18 27  Weight: 77.111 kg (170 lb)   SpO2: 93% 92%      Constitutional: NAD, calm, comfortable, lethargic but arouses to voice, able to sit up in bed unassisted, oriented to person, place, month, year, knows who the POTUS is Filed Vitals:   04/13/16 2141 04/14/16 0232  BP: 111/56 117/51  Pulse: 64 68  Temp: 99.1 F (37.3 C) 99 F (37.2 C)  TempSrc:  Oral  Resp: 18 27  Weight: 77.111 kg (170 lb)   SpO2: 93% 92%   Eyes: pupils are small but equal and reactive, conjunctiva are clear ENMT: Mucous membranes are dry. Posterior pharynx clear of any exudate or lesions  Normal dentition.  Neck: normal, supple, no masses Respiratory: LLL ronchi.  No wheezing.  No crackles. Normal respiratory effort. No accessory muscle use.  Cardiovascular: Regular rate and rhythm, no murmurs / rubs / gallops. No extremity edema. 2+ pedal pulses.  Abdomen: no tenderness, no masses palpated. Bowel sounds positive.  Musculoskeletal: no clubbing / cyanosis. No joint deformity upper and lower extremities. Good ROM, no contractures. Normal muscle tone.  Skin: no rashes, lesions, ulcers. No induration Neurologic: CN 2-12 grossly intact. Sensation intact, Strength 5/5 in all 4.  Psychiatric: Lethargic but interacts appropriately.  Labs on Admission: I have personally reviewed following labs and imaging studies  CBC:  Recent Labs Lab 04/13/16 1118 04/13/16 2157 04/14/16 0323  WBC 17.6* 16.3* 15.8*  NEUTROABS 14432*  --  12.7*  HGB 12.7* 12.6* 11.7*  HCT 38.7 37.9* 35.9*  MCV 89.8 89.6 89.3   PLT 189 167 [id number]*   Basic Metabolic Panel:  Recent Labs Lab 04/13/16 1118 04/13/16 2157  NA 137 136  K 4.1 3.9  CL 105 105  CO2 25 23  GLUCOSE 111* 156*  BUN 20 18  CREATININE 0.98 1.22  CALCIUM 8.8 8.9   GFR: Estimated Creatinine Clearance: 44.4 mL/min (by C-G formula based on Cr of 1.22). Liver Function Tests:  Recent Labs Lab 04/13/16 1118 04/13/16 2157  AST 11 16  ALT 8* 13*  ALKPHOS 57 57  BILITOT 0.6 1.0  PROT 5.9* 6.3*  ALBUMIN 3.6 3.5   CBG:  Recent Labs Lab 04/13/16 2343  GLUCAP 169*   Urine analysis:    Component Value Date/Time   COLORURINE DARK YELLOW 04/13/2016 1118  APPEARANCEUR CLEAR 04/13/2016 1118   LABSPEC 1.029 04/13/2016 1118   PHURINE 5.5 04/13/2016 1118   GLUCOSEU NEGATIVE 04/13/2016 1118   HGBUR 1+* 04/13/2016 1118   BILIRUBINUR NEGATIVE 04/13/2016 1118   KETONESUR TRACE* 04/13/2016 1118   PROTEINUR NEGATIVE 04/13/2016 1118   UROBILINOGEN 0.2 12/19/2014 1136   NITRITE NEGATIVE 04/13/2016 1118   LEUKOCYTESUR NEGATIVE 04/13/2016 1118   Radiological Exams on Admission: Dg Chest 2 View  04/13/2016  CLINICAL DATA:  Acute onset of fever and hemoptysis. Generalized weakness and altered mental status. EXAM: CHEST  2 VIEW COMPARISON:  Chest radiograph performed 02/15/2016 FINDINGS: The lungs are well-aerated. Left basilar airspace opacity is compatible with pneumonia. The lungs are mildly hypoexpanded. No definite pleural effusion or pneumothorax is seen. The heart is borderline normal in size. A pacemaker is noted at the left chest wall, with leads ending at the right atrium and right ventricle. No acute osseous abnormalities are seen. IMPRESSION: Left basilar airspace opacity is compatible with pneumonia. Lungs mildly hypoexpanded. Electronically Signed   By: Garald Balding M.D.   On: 04/13/2016 23:37   Ct Head Wo Contrast  04/13/2016  CLINICAL DATA:  Confusion and irritability EXAM: CT HEAD WITHOUT CONTRAST TECHNIQUE: Contiguous axial  images were obtained from the base of the skull through the vertex without intravenous contrast. COMPARISON:  05/19/2005 FINDINGS: The bony calvarium is intact. Diffuse atrophic changes are noted. There are changes consistent with prior infarct in the distribution of the right middle cerebral artery. No findings to suggest acute hemorrhage, acute infarction or space-occupying mass lesion are noted. IMPRESSION: Atrophic changes and prior right middle cerebral artery infarct which are stable. No acute abnormality noted. Electronically Signed   By: Inez Catalina M.D.   On: 04/13/2016 15:20    EKG: Independently reviewed. NSR, No acute ST segment changes  Assessment/Plan Principal Problem:   HCAP (healthcare-associated pneumonia) Active Problems:   Paroxysmal atrial fibrillation (HCC)   Current use of long term anticoagulation   Sleep apnea in adult   Essential hypertension   Cardiac pacemaker in situ   COPD (chronic obstructive pulmonary disease) (HCC)   Chronic combined systolic and diastolic congestive heart failure (HCC)   Tick bite   Acute encephalopathy   Sepsis ([place])  Early sepsis secondary to LLL HCAP, patient also has a history of tick exposure and wife reports that he has been treated for tick borne illness in the past --Admit to telemetry --Agree with empiric IV antibiotic coverage for HCAP (vanc and cefepime), will add doxycycline due to history of tick bite --Sputum gram stain and culture --Urine legionella and streptococcal anticgnes --Blood cultures --Borrelia PCR, Ehrlichia and RMSF antibody panel --Supplemental oxygen as needed --Cautious IV fluid resuscitation due to history of CHF --Normal lactic acid, will check procalcitonin  Paroxysmal atrial fibrillation --Continue home dose of amiodarone --Warfarin management per pharmacy  Urinary frequency, U/A does not suggest infection --Urine culture pending --Cipro stopped, IV cefepime should  cover  COPD --Compensated  CHF --Compensated  Acute encephalopathy secondary to infection --Already improving per family  DVT prophylaxis: Anticoagulated with warfarin Code Status: FULL Family Communication: Wife and daughter at bedside Disposition Plan: Expect he will go home when ready for discharge Consults called: NONE Admission status: Inpatient, telemetry   Eber Jones MD [facility]  If 7PM-7AM, please contact night-coverage [email or web address] Password [facility]  04/14/2016, 4:46 AM

## 2016-04-14 NOTE — ED Notes (Signed)
Attempted to call report

## 2016-04-14 NOTE — Care Management Note (Signed)
Case Management Note  Patient Details  Name: MIRL TROCCOLI MRN: [id number] Date of Birth: [birth year]  Subjective/Objective:                 Admitted with  Acute encephalopathy/Sepsis, hx of dementia, CVA, paroxysmal atrial fibrillation (anticoagulated with warfarin),  COPD, OSA (noncompliant with CPAP), and HTN. Resides with wife, Lenell Antu. Wife states PTA pt was independent with ADL's . Recent falls @ home however pt doesn't use any assistive devices per wife.    Action/Plan: Return to home when medically stable. CM to f/u with disposition needs.  Expected Discharge Date:                  Expected Discharge Plan:  Home/Self Care  In-House Referral:     Discharge planning Services  CM Consult  Post Acute Care Choice:    Choice offered to:     DME Arranged:    DME Agency:     HH Arranged:    HH Agency:     Status of Service:  In process, will continue to follow  Medicare Important Message Given:    Date Medicare IM Given:    Medicare IM give by:    Date Additional Medicare IM Given:    Additional Medicare Important Message give by:     If discussed at [place] of Stay Meetings, dates discussed:    Additional Comments: Roth Hopf ([id number], Jolin Milbert [facility]) [phone]  Whitman Hero [place], [place] [phone] 04/14/2016, 10:52 AM

## 2016-04-15 DIAGNOSIS — G300 Alzheimer's disease with early onset: Secondary | ICD-10-CM

## 2016-04-15 DIAGNOSIS — F0280 Dementia in other diseases classified elsewhere without behavioral disturbance: Secondary | ICD-10-CM

## 2016-04-15 DIAGNOSIS — R410 Disorientation, unspecified: Secondary | ICD-10-CM

## 2016-04-15 DIAGNOSIS — J189 Pneumonia, unspecified organism: Principal | ICD-10-CM

## 2016-04-15 DIAGNOSIS — E039 Hypothyroidism, unspecified: Secondary | ICD-10-CM

## 2016-04-15 DIAGNOSIS — J449 Chronic obstructive pulmonary disease, unspecified: Secondary | ICD-10-CM

## 2016-04-15 DIAGNOSIS — Z7901 Long term (current) use of anticoagulants: Secondary | ICD-10-CM

## 2016-04-15 DIAGNOSIS — I480 Paroxysmal atrial fibrillation: Secondary | ICD-10-CM

## 2016-04-15 DIAGNOSIS — I5042 Chronic combined systolic (congestive) and diastolic (congestive) heart failure: Secondary | ICD-10-CM

## 2016-04-15 DIAGNOSIS — G9340 Encephalopathy, unspecified: Secondary | ICD-10-CM

## 2016-04-15 DIAGNOSIS — A419 Sepsis, unspecified organism: Secondary | ICD-10-CM

## 2016-04-15 LAB — LEGIONELLA PNEUMOPHILA SEROGP 1 UR AG: L. PNEUMOPHILA SEROGP 1 UR AG: NEGATIVE

## 2016-04-15 LAB — URINE CULTURE
Colony Count: NO GROWTH
Culture: NO GROWTH
Organism ID, Bacteria: NO GROWTH

## 2016-04-15 LAB — BASIC METABOLIC PANEL
ANION GAP: 5 (ref 5–15)
BUN: 10 mg/dL (ref 6–20)
CALCIUM: 8.3 mg/dL — AB (ref 8.9–10.3)
CO2: 23 mmol/L (ref 22–32)
Chloride: 109 mmol/L (ref 101–111)
Creatinine, Ser: 0.94 mg/dL (ref 0.61–1.24)
Glucose, Bld: 139 mg/dL — ABNORMAL HIGH (ref 65–99)
Potassium: 3.7 mmol/L (ref 3.5–5.1)
Sodium: 137 mmol/L (ref 135–145)

## 2016-04-15 LAB — PROTIME-INR
INR: 1.87 — ABNORMAL HIGH (ref 0.00–1.49)
Prothrombin Time: 21.5 seconds — ABNORMAL HIGH (ref 11.6–15.2)

## 2016-04-15 LAB — CBC
HEMATOCRIT: 33.1 % — AB (ref 39.0–52.0)
Hemoglobin: 10.6 g/dL — ABNORMAL LOW (ref 13.0–17.0)
MCH: 28.9 pg (ref 26.0–34.0)
MCHC: 32 g/dL (ref 30.0–36.0)
MCV: 90.2 fL (ref 78.0–100.0)
PLATELETS: 129 10*3/uL — AB (ref 150–400)
RBC: 3.67 MIL/uL — ABNORMAL LOW (ref 4.22–5.81)
RDW: 14.6 % (ref 11.5–15.5)
WBC: 10 10*3/uL (ref 4.0–10.5)

## 2016-04-15 LAB — EXPECTORATED SPUTUM ASSESSMENT W REFEX TO RESP CULTURE

## 2016-04-15 LAB — LYME DISEASE DNA BY PCR(BORRELIA BURG): LYME DISEASE(B. BURGDORFERI) PCR: NEGATIVE

## 2016-04-15 LAB — EHRLICHIA ANTIBODY PANEL
E chaffeensis (HGE) Ab, IgG: NEGATIVE
E chaffeensis (HGE) Ab, IgM: NEGATIVE
E. CHAFFEENSIS (HME) IGM TITER: NEGATIVE
E. CHAFFEENSIS IGG AB: NEGATIVE

## 2016-04-15 LAB — EXPECTORATED SPUTUM ASSESSMENT W GRAM STAIN, RFLX TO RESP C

## 2016-04-15 MED ORDER — WARFARIN SODIUM 6 MG PO TABS
6.0000 mg | ORAL_TABLET | Freq: Once | ORAL | Status: AC
  Administered 2016-04-15: 6 mg via ORAL
  Filled 2016-04-15 (×2): qty 1

## 2016-04-15 NOTE — Progress Notes (Signed)
ANTICOAGULATION CONSULT NOTE - Follow Up Consult  Pharmacy Consult for Coumadin Indication: atrial fibrillation  Allergies  Allergen Reactions  . Bee Venom Anaphylaxis    Patient Measurements: Height: 5' 9.6" (176.8 cm) Weight: 164 lb 0.4 oz (74.4 kg) IBW/kg (Calculated) : 72.08  Vital Signs: Temp: 98.1 F (36.7 C) (05/24 0542) BP: 111/51 mmHg (05/24 0542) Pulse Rate: 65 (05/24 0542)  Labs:  Recent Labs  04/13/16 1118 04/13/16 2157 04/14/16 0323 04/14/16 0534 04/14/16 0729 04/15/16 0510  HGB 12.7* 12.6* 11.7*  --   --  10.6*  HCT 38.7 37.9* 35.9*  --   --  33.1*  PLT 189 167 147*  --   --  129*  APTT  --   --   --   --  38*  --   LABPROT  --   --   --  24.7* 25.4* 21.5*  INR  --   --   --  2.25* 2.34* 1.87*  CREATININE 0.98 1.22  --   --   --  0.94    Estimated Creatinine Clearance: 59.7 mL/min (by C-G formula based on Cr of 0.94).   Medications:  Prescriptions prior to admission  Medication Sig Dispense Refill Last Dose  . amiodarone (PACERONE) 200 MG tablet Take 1 tablet (200 mg total) by mouth 2 (two) times daily. 30 tablet 0 04/13/2016 at Unknown time  . cholecalciferol (VITAMIN D) 1000 UNITS tablet Take 2,000 Units by mouth daily.    04/13/2016 at Unknown time  . ciprofloxacin (CIPRO) 500 MG tablet Take 1 tablet (500 mg total) by mouth 2 (two) times daily. 14 tablet 0 04/13/2016 at Unknown time  . hydrocortisone 2.5 % cream Apply 1 application topically daily as needed (inflammation on face). To face   04/13/2016 at Unknown time  . levothyroxine (SYNTHROID, LEVOTHROID) 50 MCG tablet Take 1 tablet (50 mcg total) by mouth daily before breakfast. 90 tablet 1 04/13/2016 at Unknown time  . NAMENDA XR 28 MG CP24 24 hr capsule TAKE 1 CAPSULE DAILY 90 capsule 1 04/13/2016 at Unknown time  . warfarin (COUMADIN) 4 MG tablet Take 4 mg by mouth daily.   04/12/2016 at 2000    Assessment: 82 yo M on Coumadin PTA for hx afib.  Previously therapeutic on home regimen of Coumadin  4mg  daily.  Dose missed 5/22 and suspect that is reason for subtherapeutic INR today.  Will give increased Coumadin dose tonight.  Goal of Therapy:  INR 2-3 Monitor platelets by anticoagulation protocol: Yes   Plan:  Coumadin 6mg  PO x 1 tonight. Continue daily INR.  [employer], Pharm.D., BCPS Clinical Pharmacist Pager [phone] 04/15/2016 2:48 PM

## 2016-04-15 NOTE — Progress Notes (Signed)
PT Cancellation Note  Patient Details Name: Paul Davies MRN: [id number] DOB: [birth year]   Cancelled Treatment:    Reason Eval/Treat Not Completed: Patient not medically ready Pt on bedrest. Will follow up once activity orders are increased in order to perform PT evaluation.   Marguarite Arbour A Mickey Hebel 04/15/2016, 9:00 AM  Wray Kearns, [place], DPT [phone]

## 2016-04-15 NOTE — Evaluation (Signed)
Physical Therapy Evaluation Patient Details Name: Paul Davies MRN: [id number] DOB: 1930-06-26 Today's Date: 04/15/2016   History of Present Illness  Patient is a 82 y/o male with hx of HTN, HLD, A-fib, COPD, dementia, CHF, CVA presents with AMS. Evaluation concerning for early sepsis with reported fever (Tmax in ED 99), leukocytosis, mental status changes, and chest xray concerning for LLL infiltrate  Clinical Impression  Patient presents with generalized weakness, balance deficits, decreased safety awareness, and impaired mobility s/p above. Tolerated gait training with and without RW for safety. Balance much improved with use of RW for support. LOB x2 without use of AD and pt not aware of deficits. "I feel fine."  Discussion with pt about safety at home and giving up yard work as pt high fall risk. Would benefit from HHPT and safety evaluation of the home. Will follow acutely to maximize independence and mobility prior to return home.    Follow Up Recommendations Home health PT;Supervision for mobility/OOB    Equipment Recommendations  None recommended by PT    Recommendations for Other Services OT consult     Precautions / Restrictions Precautions Precautions: Fall Restrictions Weight Bearing Restrictions: No      Mobility  Bed Mobility Overal bed mobility: Needs Assistance Bed Mobility: Supine to Sit     Supine to sit: Supervision;HOB elevated     General bed mobility comments: Use of rail. No assist needed.  Transfers Overall transfer level: Needs assistance Equipment used: None;Rolling walker (2 wheeled) Transfers: Sit to/from Stand Sit to Stand: Min guard;Min assist         General transfer comment: Stood from EOB x2, transferred to chair post ambulation bout. Posterior bias in standing needing Min A.  Ambulation/Gait Ambulation/Gait assistance: Mod assist;Min assist Ambulation Distance (Feet): 100 Feet (x2 bouts) Assistive device: Rolling walker (2  wheeled);None Gait Pattern/deviations: Step-through pattern;Decreased stride length;Staggering left;Drifts right/left;Staggering right;Trunk flexed Gait velocity: decreased   General Gait Details: Ambulated without RW- staggering, increased speed, LOB x2 Mod A to maintain balance; with RW- slower gait speed, more steady and cues for RW management. DOE. 1 seated rest break.  Stairs            Wheelchair Mobility    Modified Rankin (Stroke Patients Only)       Balance Overall balance assessment: Needs assistance Sitting-balance support: Feet supported;No upper extremity supported Sitting balance-Leahy Scale: Good     Standing balance support: During functional activity Standing balance-Leahy Scale: Fair Standing balance comment: Able to stand unsupported with posterior bias however balance improved with UE support on RW.                             Pertinent Vitals/Pain Pain Assessment: No/denies pain    Home Living Family/patient expects to be discharged to:: Private residence Living Arrangements: Spouse/significant other Available Help at Discharge: Family;Available 24 hours/day (wife not able to assist physically.) Type of Home: House Home Access: Stairs to enter Entrance Stairs-Rails: Right Entrance Stairs-Number of Steps: 2 Home Layout: One level Home Equipment: Walker - 2 wheels;Bedside commode;Grab bars - tub/shower;Shower seat - built in Additional Comments: Wife reports she has a new handicapped bathroom but does not want to use it to get it dirty.    Prior Function Level of Independence: Independent         Comments: Works in yard, Database administrator, gardens. Reports multiple falls at home.      Hand Dominance  Extremity/Trunk Assessment   Upper Extremity Assessment: Defer to OT evaluation           Lower Extremity Assessment: Generalized weakness         Communication   Communication: HOH  Cognition Arousal/Alertness:  Awake/alert Behavior During Therapy: WFL for tasks assessed/performed Overall Cognitive Status: History of cognitive impairments - at baseline (decreased safety awareness.)                      General Comments General comments (skin integrity, edema, etc.): Lengthy discussion with pt/family about safety at home - need to use RW, stop working in yard and gardening, using handicapped walk in shower instead of stepping into tub, anti slip mats for tub etc. Daughter present and states her parents don't want her help.    Exercises        Assessment/Plan    PT Assessment Patient needs continued PT services  PT Diagnosis Difficulty walking;Generalized weakness   PT Problem List Decreased strength;Cardiopulmonary status limiting activity;Decreased cognition;Decreased balance;Decreased mobility;Decreased safety awareness;Decreased knowledge of use of DME  PT Treatment Interventions Balance training;Gait training;Stair training;Functional mobility training;Therapeutic activities;Therapeutic exercise;Patient/family education;DME instruction   PT Goals (Current goals can be found in the Care Plan section) Acute Rehab PT Goals Patient Stated Goal: to go home and get back to the yard PT Goal Formulation: With patient/family Time For Goal Achievement: 04/29/16 Potential to Achieve Goals: Good    Frequency Min 3X/week   Barriers to discharge Decreased caregiver support;Inaccessible home environment stairs to enter home and wife is not able to assist.     Co-evaluation               End of Session Equipment Utilized During Treatment: Gait belt Activity Tolerance: Patient tolerated treatment well Patient left: in chair;with chair alarm set;with call bell/phone within reach;with family/visitor present Nurse Communication: Mobility status         Time: [id number] PT Time Calculation (min) (ACUTE ONLY): 32 min   Charges:   PT Evaluation $PT Eval Moderate Complexity: 1  Procedure PT Treatments $Gait Training: 8-22 mins   PT G Codes:        Marsheila Alejo A Ireland Chagnon 04/15/2016, 11:56 AM Wray Kearns, [place], DPT [phone]

## 2016-04-15 NOTE — Progress Notes (Signed)
[facility]                                                                              Patient Demographics  Paul Davies, is a 82 y.o. male, DOB - Aug 19, 1930, [id number]  Admit date - 04/14/2016   Admitting Physician Lily Kocher, MD  Outpatient Primary MD for the patient is Alesia Richards, MD  Outpatient specialists:   LOS - 1  days    Chief Complaint  Patient presents with  . Altered Mental Status       Brief summary   Paul Davies is an 82 y.o. male with multiple medical problems including dementia, prior CVA, paroxysmal atrial fibrillation (anticoagulated with warfarin), CHF (combined systolic and diastolic), COPD, OSA (noncompliant with CPAP), and HTN Who was admitted 04/14/16 with altered mental status secondary to sepsis from HCAP.   Assessment & Plan   Principal Problem:  Sepsis secondary to HCAP (healthcare-associated pneumonia) - Overall improving, continue IV vancomycin and cefepime - Doxycycline added due to concerns for possible tickborne illness - Follow up on blood cultures, sputum cultures, ehrlichia, Lyme, RMSF panel - Strep antigen negative, follow urine legionella antigen   Active Problems:  Paroxysmal atrial fibrillation (HCC)/Current use of long term anticoagulation with cardiac pacemaker in situ Continue amiodarone CHADS VASC score >3, continue Coumadin per pharmacy   Sleep apnea in adult /COPD (chronic obstructive pulmonary disease) ([place]) Monitor respiratory status closely.   Chronic combined systolic and diastolic congestive heart failure (HCC) Negative balance of 700 mL, currently compensated  Monitor  daily weights and I/O.    Tick bite Follow-up Borrelia PCR, Ehrlichia and RMSF antibody panel. Continue empiric doxycycline for now.   Acute encephalopathy/dementia-improved, close to baseline mental status Appears to be secondary to sepsis/infection.  Continue [facility] PT evaluation    Hypothyroidism Continue Synthroid. Follow TSH  Code Status: Full code  DVT Prophylaxis:  Lovenox    Family Communication: Discussed in detail with the patient, all imaging results, lab results explained to the patient , wife and daughter    Disposition Plan: Hopefully DC next 24-48 hours  Time Spent in minutes  25 minutes  Procedures:  None  Consultants:   None  Antimicrobials:   IV vancomycin  IV cefepime  IV doxycycline   Medications  Scheduled Meds: . amiodarone  200 mg Oral BID  . ceFEPime (MAXIPIME) IV  2 g Intravenous Q24H  . doxycycline (VIBRAMYCIN) IV  100 mg Intravenous Q12H  . levothyroxine  50 mcg Oral QAC breakfast  . memantine  28 mg Oral Daily  . vancomycin  750 mg Intravenous Q12H  . warfarin  4 mg Oral q1800  . Warfarin - Pharmacist Dosing Inpatient   Does not apply q1800   Continuous Infusions:  PRN Meds:.   Antibiotics   Anti-infectives    Start     Dose/Rate Route Frequency Ordered Stop   04/15/16 0500  ceFEPIme (MAXIPIME) 2 g in dextrose 5 % 50 mL IVPB     2 g 100 mL/hr over 30 Minutes Intravenous Every 24 hours 04/14/16 0632 04/23/16 0459   04/14/16 1600  vancomycin (VANCOCIN) IVPB 750 mg/150 ml  premix     750 mg 150 mL/hr over 60 Minutes Intravenous Every 12 hours 04/14/16 0632     04/14/16 0715  ceFEPIme (MAXIPIME) 1 g in dextrose 5 % 50 mL IVPB  Status:  Discontinued     1 g 100 mL/hr over 30 Minutes Intravenous Every 8 hours 04/14/16 0714 04/14/16 0716   04/14/16 0500  doxycycline (VIBRAMYCIN) 100 mg in dextrose 5 % 250 mL IVPB     100 mg 125 mL/hr over 120 Minutes Intravenous Every 12 hours 04/14/16 0414     04/14/16 0300  ceFEPIme (MAXIPIME) 2 g in dextrose 5 % 50 mL IVPB     2 g 100 mL/hr over 30 Minutes Intravenous  Once 04/14/16 0254 04/14/16 0507   04/14/16 0300  vancomycin (VANCOCIN) IVPB 1000 mg/200 mL premix     1,000 mg 200 mL/hr over 60 Minutes Intravenous  Once 04/14/16 0254 04/14/16 0601        Subjective:    Paul Davies was seen and examined today.  Feeling better today, much more alert and oriented, close to baseline today. Wife at the bedside. Patient denies dizziness, chest pain, shortness of breath, abdominal pain, N/V/D/C, new weakness, numbess, tingling. No acute events overnight.    Objective:   Filed Vitals:   04/14/16 0705 04/14/16 0707 04/14/16 2131 04/15/16 0542  BP:  123/60 105/60 111/51  Pulse:  71 113 65  Temp:  97.9 F (36.6 C) 98.1 F (36.7 C) 98.1 F (36.7 C)  TempSrc:      Resp:  18 16 16   Height: 5' 9.6" (1.768 m)     Weight: 73.755 kg (162 lb 9.6 oz)   74.4 kg (164 lb 0.4 oz)  SpO2:  99% 96% 93%   No intake or output data in the 24 hours ending 04/15/16 1337   Wt Readings from Last 3 Encounters:  04/15/16 74.4 kg (164 lb 0.4 oz)  04/13/16 79.833 kg (176 lb)  03/16/16 80.015 kg (176 lb 6.4 oz)     Exam  General: Alert and oriented x 3, NAD, Frail ill-appearing  HEENT:  PERRLA, EOMI, Anicteric Sclera, mucous membranes moist.   Neck: Supple, no JVD, no masses  Cardiovascular: S1 S2 auscultated, no rubs, murmurs or gallops. Regular rate and rhythm.  Respiratory: Bilateral rhonchi   Gastrointestinal: Soft, nontender, nondistended, + bowel sounds  Ext: no cyanosis clubbing or edema  Neuro: AAOx3, Cr N's II- XII. Strength 5/5 upper and lower extremities bilaterally  Skin: No rashes  Psych: Normal affect and demeanor, alert and oriented x3    Data Reviewed:  I have personally reviewed following labs and imaging studies  Micro Results Recent Results (from the past 240 hour(s))  Culture, Urine     Status: None   Collection Time: 04/13/16 11:16 AM  Result Value Ref Range Status   Colony Count NO GROWTH  Final   Organism ID, Bacteria NO GROWTH  Final  Urine culture     Status: None   Collection Time: 04/14/16  4:49 AM  Result Value Ref Range Status   Specimen Description URINE, CATHETERIZED  Final   Special Requests NONE  Final   Culture NO  GROWTH  Final   Report Status 04/15/2016 FINAL  Final  Culture, sputum-assessment     Status: None   Collection Time: 04/14/16  8:42 PM  Result Value Ref Range Status   Specimen Description EXPECTORATED SPUTUM  Final   Special Requests NONE  Final   Sputum evaluation  Final    THIS SPECIMEN IS ACCEPTABLE. RESPIRATORY CULTURE REPORT TO FOLLOW.   Report Status 04/15/2016 FINAL  Final  Culture, respiratory (NON-Expectorated)     Status: None (Preliminary result)   Collection Time: 04/14/16  8:42 PM  Result Value Ref Range Status   Specimen Description EXPECTORATED SPUTUM  Final   Special Requests NONE  Final   Gram Stain   Final    MODERATE WBC PRESENT,BOTH PMN AND MONONUCLEAR FEW GRAM POSITIVE COCCI IN PAIRS RARE GRAM NEGATIVE COCCI    Culture PENDING  Incomplete   Report Status PENDING  Incomplete    Radiology Reports Dg Chest 2 View  04/13/2016  CLINICAL DATA:  Acute onset of fever and hemoptysis. Generalized weakness and altered mental status. EXAM: CHEST  2 VIEW COMPARISON:  Chest radiograph performed 02/15/2016 FINDINGS: The lungs are well-aerated. Left basilar airspace opacity is compatible with pneumonia. The lungs are mildly hypoexpanded. No definite pleural effusion or pneumothorax is seen. The heart is borderline normal in size. A pacemaker is noted at the left chest wall, with leads ending at the right atrium and right ventricle. No acute osseous abnormalities are seen. IMPRESSION: Left basilar airspace opacity is compatible with pneumonia. Lungs mildly hypoexpanded. Electronically Signed   By: Garald Balding M.D.   On: 04/13/2016 23:37   Ct Head Wo Contrast  04/13/2016  CLINICAL DATA:  Confusion and irritability EXAM: CT HEAD WITHOUT CONTRAST TECHNIQUE: Contiguous axial images were obtained from the base of the skull through the vertex without intravenous contrast. COMPARISON:  05/19/2005 FINDINGS: The bony calvarium is intact. Diffuse atrophic changes are noted. There are  changes consistent with prior infarct in the distribution of the right middle cerebral artery. No findings to suggest acute hemorrhage, acute infarction or space-occupying mass lesion are noted. IMPRESSION: Atrophic changes and prior right middle cerebral artery infarct which are stable. No acute abnormality noted. Electronically Signed   By: Inez Catalina M.D.   On: 04/13/2016 15:20    Lab Data:  CBC:  Recent Labs Lab 04/13/16 1118 04/13/16 2157 04/14/16 0323 04/15/16 0510  WBC 17.6* 16.3* 15.8* 10.0  NEUTROABS 14432*  --  12.7*  --   HGB 12.7* 12.6* 11.7* 10.6*  HCT 38.7 37.9* 35.9* 33.1*  MCV 89.8 89.6 89.3 90.2  PLT 189 167 147* [id number]*   Basic Metabolic Panel:  Recent Labs Lab 04/13/16 1118 04/13/16 2157 04/15/16 0510  NA 137 136 137  K 4.1 3.9 3.7  CL 105 105 109  CO2 25 23 23   GLUCOSE 111* 156* 139*  BUN 20 18 10   CREATININE 0.98 1.22 0.94  CALCIUM 8.8 8.9 8.3*   GFR: Estimated Creatinine Clearance: 59.7 mL/min (by C-G formula based on Cr of 0.94). Liver Function Tests:  Recent Labs Lab 04/13/16 1118 04/13/16 2157  AST 11 16  ALT 8* 13*  ALKPHOS 57 57  BILITOT 0.6 1.0  PROT 5.9* 6.3*  ALBUMIN 3.6 3.5   No results for input(s): LIPASE, AMYLASE in the last 168 hours. No results for input(s): AMMONIA in the last 168 hours. Coagulation Profile:  Recent Labs Lab 04/14/16 0534 04/14/16 0729 04/15/16 0510  INR 2.25* 2.34* 1.87*   Cardiac Enzymes: No results for input(s): CKTOTAL, CKMB, CKMBINDEX, TROPONINI in the last 168 hours. BNP (last 3 results) No results for input(s): PROBNP in the last 8760 hours. HbA1C: No results for input(s): HGBA1C in the last 72 hours. CBG:  Recent Labs Lab 04/13/16 2343  GLUCAP 169*   Lipid Profile: No  results for input(s): CHOL, HDL, LDLCALC, TRIG, CHOLHDL, LDLDIRECT in the last 72 hours. Thyroid Function Tests: No results for input(s): TSH, T4TOTAL, FREET4, T3FREE, THYROIDAB in the last 72 hours. Anemia  Panel: No results for input(s): VITAMINB12, FOLATE, FERRITIN, TIBC, IRON, RETICCTPCT in the last 72 hours. Urine analysis:    Component Value Date/Time   COLORURINE AMBER* 04/14/2016 0449   APPEARANCEUR CLOUDY* 04/14/2016 0449   LABSPEC 1.026 04/14/2016 0449   PHURINE 5.5 04/14/2016 0449   GLUCOSEU NEGATIVE 04/14/2016 0449   HGBUR SMALL* 04/14/2016 0449   BILIRUBINUR NEGATIVE 04/14/2016 0449   KETONESUR NEGATIVE 04/14/2016 0449   PROTEINUR NEGATIVE 04/14/2016 0449   UROBILINOGEN 0.2 12/19/2014 1136   NITRITE NEGATIVE 04/14/2016 0449   LEUKOCYTESUR NEGATIVE 04/14/2016 0449     Elfa Wooton M.D. [facility] 04/15/2016, 1:37 PM  Pager: [phone] Between 7am to 7pm - call Pager - [phone]  After 7pm go to [email or web address] - password [email or web address]  Call night coverage person covering after 7pm

## 2016-04-16 ENCOUNTER — Other Ambulatory Visit: Payer: Self-pay

## 2016-04-16 DIAGNOSIS — Z950 Presence of cardiac pacemaker: Secondary | ICD-10-CM

## 2016-04-16 LAB — BASIC METABOLIC PANEL
ANION GAP: 6 (ref 5–15)
BUN: 7 mg/dL (ref 6–20)
CALCIUM: 8.6 mg/dL — AB (ref 8.9–10.3)
CO2: 25 mmol/L (ref 22–32)
CREATININE: 0.87 mg/dL (ref 0.61–1.24)
Chloride: 107 mmol/L (ref 101–111)
Glucose, Bld: 129 mg/dL — ABNORMAL HIGH (ref 65–99)
Potassium: 4.1 mmol/L (ref 3.5–5.1)
SODIUM: 138 mmol/L (ref 135–145)

## 2016-04-16 LAB — CBC
HEMATOCRIT: 36.3 % — AB (ref 39.0–52.0)
Hemoglobin: 11.7 g/dL — ABNORMAL LOW (ref 13.0–17.0)
MCH: 28.8 pg (ref 26.0–34.0)
MCHC: 32.2 g/dL (ref 30.0–36.0)
MCV: 89.4 fL (ref 78.0–100.0)
PLATELETS: 154 10*3/uL (ref 150–400)
RBC: 4.06 MIL/uL — ABNORMAL LOW (ref 4.22–5.81)
RDW: 14.4 % (ref 11.5–15.5)
WBC: 8.4 10*3/uL (ref 4.0–10.5)

## 2016-04-16 LAB — ROCKY MTN SPOTTED FVR ABS PNL(IGG+IGM)
RMSF IgG: POSITIVE — AB
RMSF IgM: 0.18 index (ref 0.00–0.89)

## 2016-04-16 LAB — PROTIME-INR
INR: 1.62 — AB (ref 0.00–1.49)
PROTHROMBIN TIME: 19.2 s — AB (ref 11.6–15.2)

## 2016-04-16 LAB — RMSF, IGG, IFA

## 2016-04-16 LAB — TSH: TSH: 4.159 u[IU]/mL (ref 0.350–4.500)

## 2016-04-16 MED ORDER — LEVOFLOXACIN 750 MG PO TABS
750.0000 mg | ORAL_TABLET | Freq: Every day | ORAL | Status: DC
  Administered 2016-04-16: 750 mg via ORAL
  Filled 2016-04-16: qty 1

## 2016-04-16 MED ORDER — LEVOFLOXACIN 750 MG PO TABS: 750.0000 mg | ORAL_TABLET | Freq: Every day | ORAL | Status: DC

## 2016-04-16 MED ORDER — DOXYCYCLINE HYCLATE 100 MG PO TABS
100.0000 mg | ORAL_TABLET | Freq: Two times a day (BID) | ORAL | Status: DC
  Administered 2016-04-16: 100 mg via ORAL
  Filled 2016-04-16: qty 1

## 2016-04-16 MED ORDER — DOXYCYCLINE HYCLATE 100 MG PO TABS: 100.0000 mg | ORAL_TABLET | Freq: Two times a day (BID) | ORAL | Status: DC

## 2016-04-16 NOTE — Evaluation (Signed)
Clinical/Bedside Swallow Evaluation Patient Details  Name: Paul Davies MRN: [id number] Date of Birth: 1930-01-07  Today's Date: 04/16/2016 Time: SLP Start Time (ACUTE ONLY): 1120 SLP Stop Time (ACUTE ONLY): 1134 SLP Time Calculation (min) (ACUTE ONLY): 14 min  Past Medical History:  Past Medical History  Diagnosis Date  . Hyperlipidemia   . Hypertension   . RLS (restless legs syndrome)   . Osteopenia   . Hypothyroidism   . Vitamin D deficiency   . Pacemaker medtronic   . Atherosclerosis of aorta ([place]) 2010    noted on lumbar CT in 2010  . COPD (chronic obstructive pulmonary disease) ([place])     On CXR  . Sinoatrial node dysfunction (HCC)   . Prediabetes   . Atrial fibrillation ([place])   . Basal cell carcinoma, face   . History of right MCA stroke ~ 2002; 2006  . Hypothyroidism   . OSA on CPAP   . DJD (degenerative joint disease)   . Chronic sinusitis   . Pneumonia 01/28/2016  . Sepsis ([place]) 04/14/2016   Past Surgical History:  Past Surgical History  Procedure Laterality Date  . Pacemaker generator change N/A 05/04/2014    Procedure: PACEMAKER GENERATOR CHANGE;  Surgeon: [name], MD;  Location: [facility] CATH LAB;  Service: Cardiovascular;  Laterality: N/A;  . Shoulder open rotator cuff repair Right 02/2008  . Basal cell carcinoma excision      "face"  . Insert / replace / remove pacemaker  04/2005  . Cardiac catheterization  02/2004    normal coronary arteries  . Transurethral resection of prostate     HPI:  Pt is an 82 year old admitted with AMS due to Early sepsis secondary to LLL HCAP, patient also has a history of tick exposure and wife reports that he has been treated for tick borne illness in the past. Family reproted to MD trouble swallowing. He was recently admitted for pna in March, but had not been seen by SLP. PMH includes dementia, prior CVA, paroxysmal atrial fibrillation, CHF, COPD, OSA (noncompliant with CPAP), and HTN    Assessment / Plan /  Recommendation Clinical Impression  Pt demonstrates normal subjective swallow function with no signs of aspiration or dysphagia. The pts daughter had reported some difficulty swallowing and pt does report he often eats too quickly and sometimes chokes on solids with meals. SLP reviewed basic precautions and even discussed the Heimilich maneuver, though pt has never had any severe choking episodes. Risk of aspiration low and unlikely to be related to current dx of pna. No SLP f/u needed, will sign off.     Aspiration Risk  Mild aspiration risk    Diet Recommendation Regular;Thin liquid   Liquid Administration via: Cup;Straw Medication Administration: Whole meds with liquid Supervision: Patient able to self feed Compensations: Slow rate;Small sips/bites Postural Changes: Seated upright at 90 degrees    Other  Recommendations Oral Care Recommendations: Oral care BID   Follow up Recommendations       Frequency and Duration            Prognosis        Swallow Study   General HPI: Pt is an 82 year old admitted with AMS due to Early sepsis secondary to LLL HCAP, patient also has a history of tick exposure and wife reports that he has been treated for tick borne illness in the past. Family reproted to MD trouble swallowing. He was recently admitted for pna in March, but had not  been seen by SLP. PMH includes dementia, prior CVA, paroxysmal atrial fibrillation, CHF, COPD, OSA (noncompliant with CPAP), and HTN  Type of Study: Bedside Swallow Evaluation Previous Swallow Assessment: none Diet Prior to this Study: Regular;Thin liquids Temperature Spikes Noted: No Respiratory Status: Room air History of Recent Intubation: No Behavior/Cognition: Alert;Cooperative;Pleasant mood Oral Cavity Assessment: Within Functional Limits Oral Care Completed by SLP: No Oral Cavity - Dentition: Adequate natural dentition Vision: Functional for self-feeding Self-Feeding Abilities: Able to feed  self Patient Positioning: Upright in chair Baseline Vocal Quality: Normal Volitional Cough: Strong Volitional Swallow: Able to elicit    Oral/Motor/Sensory Function Overall Oral Motor/Sensory Function: Within functional limits   Ice Chips     Thin Liquid Thin Liquid: Within functional limits Presentation: Cup;Straw;Self Fed    Nectar Thick Nectar Thick Liquid: Not tested   Honey Thick Honey Thick Liquid: Not tested   Puree Puree: Within functional limits   Solid   GO   Solid: Within functional limits Presentation: Self Essie Hart, MA CCC-SLP [phone] Murl Golladay, Katherene Ponto 04/16/2016,1:24 PM

## 2016-04-16 NOTE — Progress Notes (Signed)
Patient was discharged home by MD order; discharged instructions  review and give to patient and his wife with care notes and prescriptions; IV DIC; patient will be escorted to the car by a volunteer via wheelchair.  

## 2016-04-16 NOTE — Consult Note (Signed)
   [facility] CM Inpatient Consult   04/16/2016  [facility] 29-Nov-1929 [id number]  REferral received for [facility] Care Management services.  Patient evaluated for community based chronic disease management services with [place] [facility] as a benefit of patient's [employer]. Patient admitted with Sepsis secondary to HCAP (healthcare-associated pneumonia) with a HX of HF, and COPD.    Spoke with patient and wife, Lenell Antu, at bedside to explain [place] Management services. Written consent obtaind.  Patient endorses Dr. Unk Pinto as his primary care provider.   Patient will receive post hospital discharge call and will be evaluated for monthly home visits for assessments and disease process education.  Left contact information and [facility] literature at bedside. Made Inpatient Case Manager aware that [place] [facility] following. Of note, [facility] Care Management services does not replace or interfere with any services that are arranged by inpatient case management or social work. Patient's wife states they had just used [place] for [facility], as well.   For additional questions or referrals please contact:    Natividad Brood, RN BSN [facility] Liaison  [phone] business mobile phone Toll free office [phone]

## 2016-04-16 NOTE — Care Management Important Message (Signed)
Important Message  Patient Details  Name: Paul Davies MRN: [id number] Date of Birth: 03-Oct-1930   Medicare Important Message Given:  Yes    Nathen May 04/16/2016, 12:37 PM

## 2016-04-16 NOTE — Discharge Summary (Signed)
Physician Discharge Summary   Patient ID: Paul Davies MRN: [id number] DOB/AGE: [birth year] 82 y.o.  Admit date: 04/14/2016 Discharge date: 04/16/2016  Primary Care Physician:  Nadean Corwin, MD  Discharge Diagnoses:    . HCAP (healthcare-associated pneumonia) . Acute encephalopathyResolved  . Paroxysmal atrial fibrillation (HCC) . Cardiac pacemaker in situ . Sleep apnea in adult . COPD (chronic obstructive pulmonary disease) (HCC) . Chronic combined systolic and diastolic congestive heart failure (HCC) . Hypothyroidism . Alzheimer's disease Tickborne illness  Consults: None   Recommendations for Outpatient Follow-up:  1. Home health PT, OT, RN, home health aide arranged 2. Please repeat CBC/BMET at next visit 3. Ehrlichia antibody panel negative, Lyme disease negative so far, follow [facility] spotted fever antibody panel    DIET: Heart healthy diet    Allergies:   Allergies  Allergen Reactions  . Bee Venom Anaphylaxis     DISCHARGE MEDICATIONS: Current Discharge Medication List    START taking these medications   Details  doxycycline (VIBRA-TABS) 100 MG tablet Take 1 tablet (100 mg total) by mouth 2 (two) times daily. Qty: 38 tablet, Refills: 0    levofloxacin (LEVAQUIN) 750 MG tablet Take 1 tablet (750 mg total) by mouth daily. X 7 days Qty: 7 tablet, Refills: 0      CONTINUE these medications which have NOT CHANGED   Details  amiodarone (PACERONE) 200 MG tablet Take 1 tablet (200 mg total) by mouth 2 (two) times daily. Qty: 30 tablet, Refills: 0    cholecalciferol (VITAMIN D) 1000 UNITS tablet Take 2,000 Units by mouth daily.     hydrocortisone 2.5 % cream Apply 1 application topically daily as needed (inflammation on face). To face    levothyroxine (SYNTHROID, LEVOTHROID) 50 MCG tablet Take 1 tablet (50 mcg total) by mouth daily before breakfast. Qty: 90 tablet, Refills: 1    NAMENDA XR 28 MG CP24 24 hr capsule TAKE 1 CAPSULE  DAILY Qty: 90 capsule, Refills: 1    warfarin (COUMADIN) 4 MG tablet Take 4 mg by mouth daily.      STOP taking these medications     ciprofloxacin (CIPRO) 500 MG tablet          Brief H and P: For complete details please refer to admission H and P, but in brief Paul Davies is an 82 y.o. male with multiple medical problems including dementia, prior CVA, paroxysmal atrial fibrillation (anticoagulated with warfarin), CHF (combined systolic and diastolic), COPD, OSA (noncompliant with CPAP), and HTN Who was admitted 04/14/16 with altered mental status secondary to sepsis from HCAP.  Hospital Course:   HCAP (healthcare-associated pneumonia) -Sepsis ruled out, blood cultures negative. Patient was placed on IV vancomycin and cefepime. - Doxycycline added due to concerns for possible tickborne illness - ehrlichia, Lyme negative, follow RMSF panel - Strep antigen negative, urine legionella antigen negative Patient's family reported that he has been working in the garden and [place] and he is being covered with ticks every day. Placed on doxycycline.   Active Problems:  Paroxysmal atrial fibrillation (HCC)/Current use of long term anticoagulation with cardiac pacemaker in situ Continue amiodarone CHADS VASC score >3, continue Coumadin. Patient needs PT/INR checked on Monday, 5/30   Sleep apnea in adult /COPD (chronic obstructive pulmonary disease) (HCC) Currently stable   Chronic combined systolic and diastolic congestive heart failure (HCC) Negative balance of 700 mL, currently compensated  Monitor daily weights and I/O.    Tick bite  Continue empiric doxycycline  for now. Follow RMSF panel   Acute encephalopathy/dementia-improved, close to baseline mental status Appears to be secondary to sepsis/infection.  Continue [facility] PT evaluation recommended home health PT OT   Hypothyroidism Continue Synthroid. TSH 4.1   Day of Discharge BP 131/79  mmHg  Pulse 87  Temp(Src) 99 F (37.2 C) (Oral)  Resp 16  Ht 5' 9.6" (1.768 m)  Wt 76.2 kg (167 lb 15.9 oz)  BMI 24.38 kg/m2  SpO2 94%  Physical Exam: General: Alert and awake oriented x3 not in any acute distress. HEENT: anicteric sclera, pupils reactive to light and accommodation CVS: S1-S2 clear no murmur rubs or gallops Chest: clear to auscultation bilaterally, no wheezing rales or rhonchi Abdomen: soft nontender, nondistended, normal bowel sounds Extremities: no cyanosis, clubbing or edema noted bilaterally Neuro: Cranial nerves II-XII intact, no focal neurological deficits   The results of significant diagnostics from this hospitalization (including imaging, microbiology, ancillary and laboratory) are listed below for reference.    LAB RESULTS: Basic Metabolic Panel:  Recent Labs Lab 04/15/16 0510 04/16/16 0631  NA 137 138  K 3.7 4.1  CL 109 107  CO2 23 25  GLUCOSE 139* 129*  BUN 10 7  CREATININE 0.94 0.87  CALCIUM 8.3* 8.6*   Liver Function Tests:  Recent Labs Lab 04/13/16 1118 04/13/16 2157  AST 11 16  ALT 8* 13*  ALKPHOS 57 57  BILITOT 0.6 1.0  PROT 5.9* 6.3*  ALBUMIN 3.6 3.5   No results for input(s): LIPASE, AMYLASE in the last 168 hours. No results for input(s): AMMONIA in the last 168 hours. CBC:  Recent Labs Lab 04/14/16 0323 04/15/16 0510 04/16/16 0631  WBC 15.8* 10.0 8.4  NEUTROABS 12.7*  --   --   HGB 11.7* 10.6* 11.7*  HCT 35.9* 33.1* 36.3*  MCV 89.3 90.2 89.4  PLT 147* 129* 154   Cardiac Enzymes: No results for input(s): CKTOTAL, CKMB, CKMBINDEX, TROPONINI in the last 168 hours. BNP: Invalid input(s): POCBNP CBG:  Recent Labs Lab 04/13/16 2343  GLUCAP 169*    Significant Diagnostic Studies:  No results found.  2D ECHO:   Disposition and Follow-up: Discharge Instructions    [facility]    Complete by:  As directed   Reason for consult:  Post hospital follow up  Diagnoses of:   COPD/  Pneumonia Heart Failure    Expected date of contact:  1-3 days (reserved for hospital discharges)  Please assign to community nurse for transition of care calls and assess for home visits. HX HF, COPD, HCAP.  Questions please call:   Charlesetta ShanksVictoria Brewer, RN BSN CCM [facility] Liaison  [phone] business mobile phone Toll free office [phone]     Diet - low sodium heart healthy    Complete by:  As directed      Increase activity slowly    Complete by:  As directed             DISPOSITION: Home   DISCHARGE FOLLOW-UP Follow-up Information    Follow up with Nadean CorwinMCKEOWN,WILLIAM DAVID, MD. Schedule an appointment as soon as possible for a visit in 10 days.   Specialty:  Internal Medicine   Why:  for hospital follow-up. Please get PT/INR checked on Monday.    Contact information:   [address] [place] [address] [phone]       [facility]   Why:  Arranged   Contact information:   [address] [place] [address] [phone]        Time spent on Discharge: 35 minutes  Signed:   RAI,RIPUDEEP M.D. [facility] 04/16/2016, 1:56 PM Pager: [phone]

## 2016-04-17 ENCOUNTER — Other Ambulatory Visit: Payer: Self-pay | Admitting: *Deleted

## 2016-04-17 ENCOUNTER — Encounter: Payer: Self-pay | Admitting: *Deleted

## 2016-04-17 DIAGNOSIS — I110 Hypertensive heart disease with heart failure: Secondary | ICD-10-CM | POA: Diagnosis not present

## 2016-04-17 DIAGNOSIS — I5042 Chronic combined systolic (congestive) and diastolic (congestive) heart failure: Secondary | ICD-10-CM | POA: Diagnosis not present

## 2016-04-17 DIAGNOSIS — F0390 Unspecified dementia without behavioral disturbance: Secondary | ICD-10-CM | POA: Diagnosis not present

## 2016-04-17 DIAGNOSIS — J449 Chronic obstructive pulmonary disease, unspecified: Secondary | ICD-10-CM | POA: Diagnosis not present

## 2016-04-17 DIAGNOSIS — W57XXXD Bitten or stung by nonvenomous insect and other nonvenomous arthropods, subsequent encounter: Secondary | ICD-10-CM | POA: Diagnosis not present

## 2016-04-17 DIAGNOSIS — J189 Pneumonia, unspecified organism: Secondary | ICD-10-CM | POA: Diagnosis not present

## 2016-04-17 LAB — CULTURE, RESPIRATORY

## 2016-04-17 LAB — CULTURE, RESPIRATORY W GRAM STAIN: Culture: NORMAL

## 2016-04-17 NOTE — Patient Outreach (Signed)
[facility]) Care Management  04/17/2016  Eldridge Rielly [facility] 09-12-1930 [id number]   1146 initial transition of care call  RN placed call to patient as part of the transition of care program, Mrs.Speas answered the phone stating that the patient was resting and she normally talks for him on the phone due to patient having some hearing difficulty while talking on the phone. HIPAA  Identifiers verified.  Mrs.Deasis reports patient had a restless evening but aftera walk with his children helping him he settled down and slept all night. She reports that he has had a good appetite today for both meals.Mrs.Stammen states patient has a CPAP that he wears on some nights.  Mr.Oxley is followed by [place] home health, RN has visited on today. Mr.Recore has a rolling walker that he needs reminding to use.  Mrs.Shular reports she has good support from her family .  Reviewed medications and reinforced importance of completing full prescription. Patient was recently discharged from hospital and all medications have been reviewed.  Current Outpatient Prescriptions on File Prior to Visit  Medication Sig Dispense Refill  . amiodarone (PACERONE) 200 MG tablet Take 1 tablet (200 mg total) by mouth 2 (two) times daily. 30 tablet 0  . cholecalciferol (VITAMIN D) 1000 UNITS tablet Take 2,000 Units by mouth daily.     Marland Kitchen doxycycline (VIBRA-TABS) 100 MG tablet Take 1 tablet (100 mg total) by mouth 2 (two) times daily. 38 tablet 0  . hydrocortisone 2.5 % cream Apply 1 application topically daily as needed (inflammation on face). To face    . levofloxacin (LEVAQUIN) 750 MG tablet Take 1 tablet (750 mg total) by mouth daily. X 7 days 7 tablet 0  . levothyroxine (SYNTHROID, LEVOTHROID) 50 MCG tablet Take 1 tablet (50 mcg total) by mouth daily before breakfast. 90 tablet 1  . NAMENDA XR 28 MG CP24 24 hr capsule TAKE 1 CAPSULE DAILY 90 capsule 1  . warfarin (COUMADIN) 4 MG tablet Take 4 mg by mouth daily.      No current facility-administered medications on file prior to visit.   Mrs.Bedore denies any new concerns at this time, provided my contact information. Caregiver will notify MD for worsening of symptoms.   Plan Patient will remain active with transition of care program and receive weekly outreaches, next call within a week and RN will schedule home visit at that time.  [facility] CM Care Plan Problem One        Most Recent Value   Care Plan Problem One  Recent hospital admission   Role Documenting the Problem One  Care Management [place] for Problem One  Active   THN Long Term Goal (31-90 days)  Patient will not experience a hospital admission in the next 31 days   THN Long Term Goal Start Date  04/17/16   Interventions for Problem One Long Term Goal  Educated on [facility] care management, provided my contact infomation, 24 nurse line number, Educated regarding importance folowing discharge instructions, taking medications as prescribed and notifying MD sooner of new or worsening symptoms   THN CM Short Term Goal #1 (0-30 days)  Patient will attend PCP follow up appointment in the next 14 days   THN CM Short Term Goal #1 Start Date  04/17/16   Interventions for Short Term Goal #1  Educated on importance of follow up after hospitalization, verified patient has transportation,   [facility] CM Short Term Goal #2 (0-30 days)  Patient will complete  antiboitic therapy full prescription in the next 30 days   THN CM Short Term Goal #2 Start Date  04/17/16   Interventions for Short Term Goal #2  RN educated caregiver on the importance of completing full course of antibiotic  therapy infection and to notify MD if problems arise   THN CM Short Term Goal #3 (0-30 days)  Patient will adhere to safety precautions of using his walker as much as possible  in the next 30 days   THN CM Short Term Goal #3 Start Date  04/17/16   Interventions for Short Tern Goal #3  Discussed fall prevention measures        Joylene Draft, RN, [place] [facility] [phone]- Mobile [phone]- [place]

## 2016-04-19 DIAGNOSIS — I5042 Chronic combined systolic (congestive) and diastolic (congestive) heart failure: Secondary | ICD-10-CM | POA: Diagnosis not present

## 2016-04-19 DIAGNOSIS — F0390 Unspecified dementia without behavioral disturbance: Secondary | ICD-10-CM | POA: Diagnosis not present

## 2016-04-19 DIAGNOSIS — W57XXXD Bitten or stung by nonvenomous insect and other nonvenomous arthropods, subsequent encounter: Secondary | ICD-10-CM | POA: Diagnosis not present

## 2016-04-19 DIAGNOSIS — J189 Pneumonia, unspecified organism: Secondary | ICD-10-CM | POA: Diagnosis not present

## 2016-04-19 DIAGNOSIS — I110 Hypertensive heart disease with heart failure: Secondary | ICD-10-CM | POA: Diagnosis not present

## 2016-04-19 DIAGNOSIS — J449 Chronic obstructive pulmonary disease, unspecified: Secondary | ICD-10-CM | POA: Diagnosis not present

## 2016-04-19 LAB — CULTURE, BLOOD (ROUTINE X 2)
CULTURE: NO GROWTH
Culture: NO GROWTH

## 2016-04-21 DIAGNOSIS — I110 Hypertensive heart disease with heart failure: Secondary | ICD-10-CM | POA: Diagnosis not present

## 2016-04-21 DIAGNOSIS — I5042 Chronic combined systolic (congestive) and diastolic (congestive) heart failure: Secondary | ICD-10-CM | POA: Diagnosis not present

## 2016-04-21 DIAGNOSIS — J449 Chronic obstructive pulmonary disease, unspecified: Secondary | ICD-10-CM | POA: Diagnosis not present

## 2016-04-21 DIAGNOSIS — J189 Pneumonia, unspecified organism: Secondary | ICD-10-CM | POA: Diagnosis not present

## 2016-04-21 DIAGNOSIS — F0390 Unspecified dementia without behavioral disturbance: Secondary | ICD-10-CM | POA: Diagnosis not present

## 2016-04-21 DIAGNOSIS — W57XXXD Bitten or stung by nonvenomous insect and other nonvenomous arthropods, subsequent encounter: Secondary | ICD-10-CM | POA: Diagnosis not present

## 2016-04-22 ENCOUNTER — Encounter: Payer: Self-pay | Admitting: Internal Medicine

## 2016-04-22 ENCOUNTER — Ambulatory Visit (INDEPENDENT_AMBULATORY_CARE_PROVIDER_SITE_OTHER): Payer: Medicare Other | Admitting: Internal Medicine

## 2016-04-22 VITALS — BP 106/62 | HR 64 | Temp 97.5°F | Resp 16 | Ht 68.0 in | Wt 171.0 lb

## 2016-04-22 DIAGNOSIS — I5042 Chronic combined systolic (congestive) and diastolic (congestive) heart failure: Secondary | ICD-10-CM | POA: Diagnosis not present

## 2016-04-22 DIAGNOSIS — F0390 Unspecified dementia without behavioral disturbance: Secondary | ICD-10-CM | POA: Diagnosis not present

## 2016-04-22 DIAGNOSIS — E559 Vitamin D deficiency, unspecified: Secondary | ICD-10-CM | POA: Diagnosis not present

## 2016-04-22 DIAGNOSIS — I480 Paroxysmal atrial fibrillation: Secondary | ICD-10-CM | POA: Diagnosis not present

## 2016-04-22 DIAGNOSIS — R7303 Prediabetes: Secondary | ICD-10-CM | POA: Diagnosis not present

## 2016-04-22 DIAGNOSIS — I10 Essential (primary) hypertension: Secondary | ICD-10-CM

## 2016-04-22 DIAGNOSIS — J189 Pneumonia, unspecified organism: Secondary | ICD-10-CM

## 2016-04-22 DIAGNOSIS — E785 Hyperlipidemia, unspecified: Secondary | ICD-10-CM | POA: Diagnosis not present

## 2016-04-22 DIAGNOSIS — Z79899 Other long term (current) drug therapy: Secondary | ICD-10-CM | POA: Insufficient documentation

## 2016-04-22 DIAGNOSIS — W57XXXD Bitten or stung by nonvenomous insect and other nonvenomous arthropods, subsequent encounter: Secondary | ICD-10-CM | POA: Diagnosis not present

## 2016-04-22 DIAGNOSIS — E039 Hypothyroidism, unspecified: Secondary | ICD-10-CM | POA: Diagnosis not present

## 2016-04-22 DIAGNOSIS — J449 Chronic obstructive pulmonary disease, unspecified: Secondary | ICD-10-CM | POA: Diagnosis not present

## 2016-04-22 DIAGNOSIS — I110 Hypertensive heart disease with heart failure: Secondary | ICD-10-CM | POA: Diagnosis not present

## 2016-04-22 LAB — CBC WITH DIFFERENTIAL/PLATELET
BASOS ABS: 0 {cells}/uL (ref 0–200)
Basophils Relative: 0 %
EOS ABS: 140 {cells}/uL (ref 15–500)
Eosinophils Relative: 2 %
HCT: 37.5 % — ABNORMAL LOW (ref 38.5–50.0)
HEMOGLOBIN: 12.4 g/dL — AB (ref 13.2–17.1)
LYMPHS ABS: 1610 {cells}/uL (ref 850–3900)
Lymphocytes Relative: 23 %
MCH: 29.4 pg (ref 27.0–33.0)
MCHC: 33.1 g/dL (ref 32.0–36.0)
MCV: 88.9 fL (ref 80.0–100.0)
MPV: 9.2 fL (ref 7.5–12.5)
Monocytes Absolute: 630 cells/uL (ref 200–950)
Monocytes Relative: 9 %
NEUTROS ABS: 4620 {cells}/uL (ref 1500–7800)
Neutrophils Relative %: 66 %
Platelets: 267 10*3/uL (ref 140–400)
RBC: 4.22 MIL/uL (ref 4.20–5.80)
RDW: 15.3 % — ABNORMAL HIGH (ref 11.0–15.0)
WBC: 7 10*3/uL (ref 3.8–10.8)

## 2016-04-22 LAB — BASIC METABOLIC PANEL WITH GFR
BUN: 19 mg/dL (ref 7–25)
CALCIUM: 9.1 mg/dL (ref 8.6–10.3)
CHLORIDE: 106 mmol/L (ref 98–110)
CO2: 25 mmol/L (ref 20–31)
CREATININE: 1.16 mg/dL — AB (ref 0.70–1.11)
GFR, EST AFRICAN AMERICAN: 66 mL/min (ref >=60)
GFR, Est Non African American: 58 mL/min — ABNORMAL LOW (ref >=60)
Glucose, Bld: 86 mg/dL (ref 65–99)
POTASSIUM: 4.8 mmol/L (ref 3.5–5.3)
SODIUM: 140 mmol/L (ref 135–146)

## 2016-04-22 LAB — TSH: TSH: 2.78 m[IU]/L (ref 0.40–4.50)

## 2016-04-22 NOTE — Progress Notes (Signed)
Patient ID: Paul Davies, male   DOB: [birth year], 82 y.o.   MRN: [id number]  [facility] ADULT & ADOLESCENT INTERNAL MEDICINE                       Unk Pinto, M.D.        Uvaldo Bristle. Silverio Lay, P.A.-C       Starlyn Skeans, P.A.-C   [facility]                [address] [place] [id number] Telephone [phone] Telefax [phone]                        _______________________________________________________________________________________     This very nice 82 y.o. MWM presents for post hospital follow up after admitted 5/23-25 with CAP & suspected sepsis and a ? of a tick-borne illness now ruled out.  Patient also was dehydrated and felt to have an encephalopathy and promptly recovered with IVF and antibiotics and currently denies any congestion or fever. Patient was contacted post hospital discharge to schedule f/u OV.  Also he is followed for Hypertension, Hyperlipidemia, Pre-Diabetes, SDAT  and Vitamin D Deficiency.      Patient is treated for HTN & BP has been controlled at home. Today's BP: 106/62 mmHg. Patient has ASHD, cAfib and PPM followed by Dr Wynonia Lawman.  Patient has had no complaints of any cardiac type chest pain, palpitations, dyspnea/orthopnea/PND, dizziness, claudication, or dependent edema.     Hyperlipidemia is near controlled with diet. Last Lipids were near goal with  Cholesterol 200; HDL 57; LDL 109; Triglycerides 172 on 12/23/2015.     Also, the patient has history of PreDiabetes with A1c 5.8%  and has had no symptoms of reactive hypoglycemia, diabetic polys, paresthesias or visual blurring.  Last A1c was 6.0% on 12/23/2015.     Further, the patient also has history of Vitamin D Deficiency of "40" in 2015 and supplements vitamin D without any suspected side-effects. Last vitamin D was "37" on 06/26/2015.   Medication Sig  . Amiodarone 200 MG tablet Take 1 tab 2  times daily.  Marland Kitchen VITAMIN D 1000 UNITS Take 2,000  Units  daily.   Marland Kitchen doxycycline 100 MG tablet Take 1 tab 2  times daily x 16 days   . hydrocortisone 2.5 % cream Apply 1 application topically daily as needed (inflammation on face)  . levofloxacin  750 MG tablet Take 1 tab daily x 7 days  . levothyroxine  50 MCG tablet Take 1 tab daily before breakfast.  . NAMENDA XR 28 MG CP24  TAKE 1 CAP DAILY  . warfarin (COUMADIN) 4 MG  Take 4 mg by mouth daily.   Allergies  Allergen Reactions  . Bee Venom Anaphylaxis   PMHx:   Past Medical History  Diagnosis Date  . Hyperlipidemia   . Hypertension   . RLS (restless legs syndrome)   . Osteopenia   . Hypothyroidism   . Vitamin D deficiency   . Pacemaker medtronic   . Atherosclerosis of aorta ([place]) 2010    noted on lumbar CT in 2010  . COPD (chronic obstructive pulmonary disease) ([place])     On CXR  . Sinoatrial node dysfunction (HCC)   . Prediabetes   . Atrial fibrillation ([place])   . Basal cell carcinoma, face   . History of right  MCA stroke ~ 2002; 2006  . Hypothyroidism   . OSA on CPAP   . DJD (degenerative joint disease)   . Chronic sinusitis   . Pneumonia 01/28/2016  . Sepsis ([place]) 04/14/2016   Immunization History  Administered Date(s) Administered  . Influenza, High Dose Seasonal PF 09/18/2014, 09/09/2015  . Influenza-Unspecified 10/04/2013  . Pneumococcal Conjugate-13 12/19/2014  . Pneumococcal Polysaccharide-23 10/17/2013  . Td 12/08/2012   Past Surgical History  Procedure Laterality Date  . Pacemaker generator change - [name], MD N/A 05/04/2014  . Shoulder open rotator cuff repair Right 02/2008  . Basal cell carcinoma excision - "face"    . Insert / replace / remove pacemaker  04/2005  . Cardiac catheterization  -  normal coronary arteries  02/2004  . Transurethral resection of prostate     FHx:    Reviewed / unchanged  SHx:    Reviewed / unchanged  Systems Review:  Constitutional: Denies fever, chills, wt changes, headaches, insomnia, fatigue, night sweats, change  in appetite. Eyes: Denies redness, blurred vision, diplopia, discharge, itchy, watery eyes.  ENT: Denies discharge, congestion, post nasal drip, epistaxis, sore throat, earache, hearing loss, dental pain, tinnitus, vertigo, sinus pain, snoring.  CV: Denies chest pain, palpitations, irregular heartbeat, syncope, dyspnea, diaphoresis, orthopnea, PND, claudication or edema. Respiratory: denies cough, dyspnea, DOE, pleurisy, hoarseness, laryngitis, wheezing.  Gastrointestinal: Denies dysphagia, odynophagia, heartburn, reflux, water brash, abdominal pain or cramps, nausea, vomiting, bloating, diarrhea, constipation, hematemesis, melena, hematochezia  or hemorrhoids. Genitourinary: Denies dysuria, frequency, urgency, nocturia, hesitancy, discharge, hematuria or flank pain. Musculoskeletal: Denies arthralgias, myalgias, stiffness, jt. swelling, pain, limping or strain/sprain.  Skin: Denies pruritus, rash, hives, warts, acne, eczema or change in skin lesion(s). Neuro: No weakness, tremor, incoordination, spasms, paresthesia or pain. Psychiatric: Denies confusion, memory loss or sensory loss. Endo: Denies change in weight, skin or hair change.  Heme/Lymph: No excessive bleeding, bruising or enlarged lymph nodes.  Physical Exam  BP 106/62 mmHg  Pulse 64  Temp(Src) 97.5 F (36.4 C)  Resp 16  Ht 5\' 8"  (1.727 m)  Wt 171 lb (77.565 kg)  BMI 26.01 kg/m2  Appears well nourished elderly chronically ill appearing male in no distress. Eyes: PERRLA, EOMs, conjunctiva no swelling or erythema. ENT/Mouth: EAC's clear, TM's nl w/o erythema, bulging. Nares clear w/o erythema, swelling, exudates. Oropharynx clear without erythema or exudates. Oral hygiene is good. Tongue normal, non obstructing. Hearing intact.  Neck: Supple. Thyroid nl. Car 2+/2+ without bruits, nodes or JVD. Chest: Respirations nl with BS clear & equal w/o rales, rhonchi, wheezing or stridor.  Cor: Heart sounds soft w/ irregular rate and  rhythm without sig. murmurs, gallops, clicks, or rubs. Peripheral pulses normal and equal  without edema.  Abdomen: Soft & bowel sounds normal. Non-tender w/o guarding, rebound, hernias, masses, or organomegaly.  Lymphatics: Unremarkable.  Musculoskeletal: Full ROM all peripheral extremities, joint stability, 5/5 strength, and normal gait.  Skin: Warm, dry without exposed rashes, lesions or ecchymosis apparent.  Neuro: Cranial nerves intact, reflexes equal bilaterally. Sensory-motor testing grossly intact. Tendon reflexes grossly intact.  Pysch: Alert & oriented x 3.  Insight and judgement nl & appropriate. No ideations.  Assessment and Plan:  1. HCAP (healthcare-associated pneumonia)   2. Essential hypertension   3. Hyperlipidemia   4. Paroxysmal atrial fibrillation (HCC)   5. Hypothyroidism  - TSH  6. Medication management  - CBC with Differential/Platelet - BASIC METABOLIC PANEL WITH GFR  7. Prediabetes   8. Vitamin D deficiency  Recommended regular exercise, BP monitoring, weight control, and discussed med and SE's. Recommended labs to assess and monitor clinical status. Further disposition pending results of labs. Over 30 minutes of exam, counseling, chart review was performed

## 2016-04-23 ENCOUNTER — Other Ambulatory Visit: Payer: Self-pay | Admitting: Internal Medicine

## 2016-04-23 ENCOUNTER — Other Ambulatory Visit: Payer: Self-pay | Admitting: *Deleted

## 2016-04-23 DIAGNOSIS — J449 Chronic obstructive pulmonary disease, unspecified: Secondary | ICD-10-CM | POA: Diagnosis not present

## 2016-04-23 DIAGNOSIS — I110 Hypertensive heart disease with heart failure: Secondary | ICD-10-CM | POA: Diagnosis not present

## 2016-04-23 DIAGNOSIS — W57XXXD Bitten or stung by nonvenomous insect and other nonvenomous arthropods, subsequent encounter: Secondary | ICD-10-CM | POA: Diagnosis not present

## 2016-04-23 DIAGNOSIS — J189 Pneumonia, unspecified organism: Secondary | ICD-10-CM | POA: Diagnosis not present

## 2016-04-23 DIAGNOSIS — F0390 Unspecified dementia without behavioral disturbance: Secondary | ICD-10-CM | POA: Diagnosis not present

## 2016-04-23 DIAGNOSIS — I5042 Chronic combined systolic (congestive) and diastolic (congestive) heart failure: Secondary | ICD-10-CM | POA: Diagnosis not present

## 2016-04-23 NOTE — Patient Outreach (Signed)
[facility]) Care Management  04/23/2016  BURKE TERRY May 24, 1930 [id number]   Transition of care call  RN placed call to Mr.Paul Davies as part of the transition of care program, able to speak with his wife Lenell Antu, HIPAA verified. Mrs.Drennen reports patient's blood pressure has been a little low when checked by home health physical therapy and RN 88/46 and 111/.  Mrs.Hull states home health RN has called doctor and was told to cut his heart pill in 1/2 that he takes twice a day, amiodarone. Mrs. Girgis reports patient denies dizziness or change in gait,  Mr. Levick continues to use his cane more than walker, no falls. Mr.Gassmann continues to have a good appetite and intake. Patient has denied any shortness of breath or  Increased swelling. Mrs.Milholland has a blood pressure monitor encourage her to monitor patient blood pressure document it and notify MD of any symptoms of dizziness, pain, shortness of breath.  Plan RN care coordinator has scheduled home visit on next week. Caregiver will notify MD of patient worsening symptoms or continued decreased blood pressure.  THN CM Care Plan Problem One        Most Recent Value   Care Plan Problem One  Recent hospital admission   Role Documenting the Problem One  Care Management [place] for Problem One  Active   THN Long Term Goal (31-90 days)  Patient will not experience a hospital admission in the next 31 days   THN Long Term Goal Start Date  04/17/16   Interventions for Problem One Long Term Goal  Educated on [facility] care management, provided my contact infomation, 24 nurse line number, Educated regarding importance folowing discharge instructions, taking medications as prescribed and notifying MD sooner of new or worsening symptoms   THN CM Short Term Goal #1 (0-30 days)  Patient will attend PCP follow up appointment in the next 14 days   THN CM Short Term Goal #1 Start Date  04/17/16   [facility] CM Short Term Goal #1 Met  Date  04/23/16   THN CM Short Term Goal #2 (0-30 days)  Patient will complete antiboitic therapy full prescription in the next 30 days   THN CM Short Term Goal #2 Start Date  04/17/16   Interventions for Short Term Goal #2  RN educated caregiver on the importance of completing full course of antibiotic  therapy infection and to notify MD if problems arise   THN CM Short Term Goal #3 (0-30 days)  Patient will adhere to safety precautions of using his walker as much as possible  in the next 30 days   THN CM Short Term Goal #3 Start Date  04/17/16   Interventions for Short Tern Goal #3  Encouraged to continue to use walking aide to decrease risk of fall   THN CM Short Term Goal #4 (0-30 days)  Caregiver will monitor and record patient blood pressure at least 2 times a week in the next 30 days   THN CM Short Term Goal #4 Start Date  04/23/16   Interventions for Short Term Goal #4  Encouraged caregiver to monitor blood pressure, record and notify MD of decreased blood pressure , symptoms of dizziness or concerns      Joylene Draft, RN, [place] Management [phone]- Mobile [phone]- [place]

## 2016-04-27 ENCOUNTER — Encounter: Payer: Self-pay | Admitting: Internal Medicine

## 2016-04-27 ENCOUNTER — Ambulatory Visit (INDEPENDENT_AMBULATORY_CARE_PROVIDER_SITE_OTHER): Payer: Medicare Other | Admitting: Internal Medicine

## 2016-04-27 VITALS — BP 112/66 | HR 72 | Temp 97.3°F | Resp 16 | Ht 68.0 in | Wt 170.0 lb

## 2016-04-27 DIAGNOSIS — I110 Hypertensive heart disease with heart failure: Secondary | ICD-10-CM | POA: Diagnosis not present

## 2016-04-27 DIAGNOSIS — I5042 Chronic combined systolic (congestive) and diastolic (congestive) heart failure: Secondary | ICD-10-CM | POA: Diagnosis not present

## 2016-04-27 DIAGNOSIS — W57XXXD Bitten or stung by nonvenomous insect and other nonvenomous arthropods, subsequent encounter: Secondary | ICD-10-CM | POA: Diagnosis not present

## 2016-04-27 DIAGNOSIS — R938 Abnormal findings on diagnostic imaging of other specified body structures: Secondary | ICD-10-CM

## 2016-04-27 DIAGNOSIS — J189 Pneumonia, unspecified organism: Secondary | ICD-10-CM | POA: Diagnosis not present

## 2016-04-27 DIAGNOSIS — I10 Essential (primary) hypertension: Secondary | ICD-10-CM

## 2016-04-27 DIAGNOSIS — N289 Disorder of kidney and ureter, unspecified: Secondary | ICD-10-CM

## 2016-04-27 DIAGNOSIS — Z79899 Other long term (current) drug therapy: Secondary | ICD-10-CM

## 2016-04-27 DIAGNOSIS — F0390 Unspecified dementia without behavioral disturbance: Secondary | ICD-10-CM | POA: Diagnosis not present

## 2016-04-27 DIAGNOSIS — R9389 Abnormal findings on diagnostic imaging of other specified body structures: Secondary | ICD-10-CM

## 2016-04-27 DIAGNOSIS — J449 Chronic obstructive pulmonary disease, unspecified: Secondary | ICD-10-CM | POA: Diagnosis not present

## 2016-04-27 LAB — BASIC METABOLIC PANEL WITH GFR
BUN: 20 mg/dL (ref 7–25)
CALCIUM: 8.6 mg/dL (ref 8.6–10.3)
CO2: 24 mmol/L (ref 20–31)
Chloride: 105 mmol/L (ref 98–110)
Creat: 1 mg/dL (ref 0.70–1.11)
GFR, EST AFRICAN AMERICAN: 80 mL/min (ref >=60)
GFR, Est Non African American: 69 mL/min (ref >=60)
GLUCOSE: 90 mg/dL (ref 65–99)
Potassium: 4.5 mmol/L (ref 3.5–5.3)
Sodium: 137 mmol/L (ref 135–146)

## 2016-04-27 NOTE — Progress Notes (Signed)
Subjective:    Patient ID: Paul Davies, male    DOB: [birth year], 82 y.o.   MRN: [id number]  HPI  This very nice 82 yo MWM with moderate SDAT  is treated for HTN & was recently hospitalized with dehydration, acute encephalopathy was ordered LPTx at home and the Physical therapist has called reporting very low BP's in the range of [id number] systolic and pt's wife was advised to taper his Amiodarone dose for fear of reported severe hypotension associated with same. Since then, patient apparently is doing OK according to his wife as his systems review is not felt reliable due to his Dementia.  Today's BP 106/62 mmHg. Patient has ASHD, cAfib and PPM followed by Dr Wynonia Lawman.  Medication Sig  . amiodarone 200 MG tablet Take 1 tab2 times daily.- decreased to 1/2 tablet 2 x/d due to hypotension  . VITAMIN D 1000 UNITS tablet Take 2,000 Units  daily.   . hydrocortisone 2.5 % cream Apply 1 application topically daily as needed to face  . NAMENDA XR 28 MG  TAKE 1 CAP DAILY  . Warfarin 4 MG tablet Take 4 mg  daily.  Marland Kitchen levothyroxine 50 MCG tablet Take 1 tab daily before breakfast.   Allergies  Allergen Reactions  . Bee Venom Anaphylaxis   Past Medical History  Diagnosis Date  . Hyperlipidemia   . Hypertension   . RLS (restless legs syndrome)   . Osteopenia   . Hypothyroidism   . Vitamin D deficiency   . Pacemaker medtronic   . Atherosclerosis of aorta ([place]) 2010    noted on lumbar CT in 2010  . COPD (chronic obstructive pulmonary disease) ([place])     On CXR  . Sinoatrial node dysfunction (HCC)   . Prediabetes   . Atrial fibrillation ([place])   . Basal cell carcinoma, face   . History of right MCA stroke ~ 2002; 2006  . Hypothyroidism   . OSA on CPAP   . DJD (degenerative joint disease)   . Chronic sinusitis   . Pneumonia 01/28/2016  . Sepsis ([place]) 04/14/2016   Past Surgical History  Procedure Laterality Date  . Pacemaker generator change N/A 05/04/2014    Procedure: PACEMAKER GENERATOR CHANGE;   Surgeon: [name], MD;  Location: [facility] CATH LAB;  Service: Cardiovascular;  Laterality: N/A;  . Shoulder open rotator cuff repair Right 02/2008  . Basal cell carcinoma excision      "face"  . Insert / replace / remove pacemaker  04/2005  . Cardiac catheterization  02/2004    normal coronary arteries  . Transurethral resection of prostate     Review of Systems  10 point systems review negative except as above.    Objective:   Physical Exam  BP 112/66 mmHg  Pulse 72  Temp(Src) 97.3 F (36.3 C)  Resp 16  Ht 5\' 8"  (1.727 m)  Wt 170 lb (77.111 kg)  BMI 25.85 kg/m2   Recheck sitting BP 148/87, P 60 and Standing BP 138/89, P 61  HEENT - Eac's patent. TM's Nl. EOM's full. PERRLA. NasoOroPharynx clear. Neck - supple. Nl Thyroid. Carotids 2+ & No bruits, nodes, JVD Chest - Clear equal BS w/o Rales, rhonchi, wheezes. Cor - Nl HS. RRR w/o sig MGR. PP 1(+). No edema. Abd - No palpable organomegaly, masses or tenderness. BS nl. MS- FROM w/o deformities. Muscle power, tone and bulk Nl. Gait stabilized with a cane. Neuro - No obvious Cr N abnormalities. Sensory, motor and Cerebellar functions appear  Nl w/o focal abnormalities. Psyche - Mental status pleasant with very limited insight. O x 1 - 2.      Assessment & Plan:   1. Essential hypertension  - DG Chest 2 View; Future  2. Renal insufficiency  - BASIC METABOLIC PANEL WITH GFR  3. Encounter for long-term (current) use of medications  - BASIC METABOLIC PANEL WITH GFR  4. Abnormal CXR  - DG Chest 2 View; Future

## 2016-04-27 NOTE — Progress Notes (Deleted)
Patient ID: Paul Davies, male   DOB: [birth year], 82 y.o.   MRN: [id number]

## 2016-04-28 NOTE — Progress Notes (Signed)
Electrophysiology Office Note Date: 04/29/2016  ID:  Paul, Davies 06-22-1930, MRN [id number]  PCP: Alesia Richards, MD Primary Cardiologist: Wynonia Lawman Electrophysiologist: Caryl Comes  CC: Pacemaker follow-up  Paul Davies is a 82 y.o. male seen today for Dr Caryl Comes.  He presents today for routine electrophysiology followup.  Since last being seen in our clinic, the patient reports doing reasonably well.  He denies chest pain, palpitations, dyspnea, PND, orthopnea, nausea, vomiting, dizziness, syncope, edema, weight gain, or early satiety.  Device History: MDT dual chamber PPM implanted 2006 for SSS; gen change 2015   Past Medical History  Diagnosis Date  . Hyperlipidemia   . Hypertension   . RLS (restless legs syndrome)   . Osteopenia   . Hypothyroidism   . Vitamin D deficiency   . Pacemaker medtronic   . Atherosclerosis of aorta ([place]) 2010    noted on lumbar CT in 2010  . COPD (chronic obstructive pulmonary disease) ([place])     On CXR  . Sinoatrial node dysfunction (HCC)   . Prediabetes   . Atrial fibrillation ([place])   . Basal cell carcinoma, face   . History of right MCA stroke ~ 2002; 2006  . Hypothyroidism   . OSA on CPAP   . DJD (degenerative joint disease)   . Chronic sinusitis   . Pneumonia 01/28/2016  . Sepsis ([place]) 04/14/2016   Past Surgical History  Procedure Laterality Date  . Pacemaker generator change N/A 05/04/2014    Procedure: PACEMAKER GENERATOR CHANGE;  Surgeon: [name], MD;  Location: [facility] CATH LAB;  Service: Cardiovascular;  Laterality: N/A;  . Shoulder open rotator cuff repair Right 02/2008  . Basal cell carcinoma excision      "face"  . Insert / replace / remove pacemaker  04/2005  . Cardiac catheterization  02/2004    normal coronary arteries  . Transurethral resection of prostate      Current Outpatient Prescriptions  Medication Sig Dispense Refill  . amiodarone (PACERONE) 200 MG tablet Take 100 mg by mouth 2 (two) times daily.      . cholecalciferol (VITAMIN D) 1000 UNITS tablet Take 2,000 Units by mouth daily.     . hydrocortisone 2.5 % cream Apply 1 application topically daily as needed (inflammation on face). To face    . memantine (NAMENDA XR) 28 MG CP24 24 hr capsule Take 28 mg by mouth daily.    Marland Kitchen METOPROLOL SUCCINATE ER PO Take 25 mg by mouth daily.    Marland Kitchen SYNTHROID 50 MCG tablet Take 50 mcg by mouth daily before breakfast.     . warfarin (COUMADIN) 4 MG tablet Take 4 mg by mouth daily.     No current facility-administered medications for this visit.    Allergies:   Bee venom   Social History: Social History   Social History  . Marital Status: Married    Spouse Name: N/A  . Number of Children: 5  . Years of Education: 11   Occupational History  . Retired    Social History Main Topics  . Smoking status: Passive Smoke Exposure - Never Smoker  . Smokeless tobacco: Never Used  . Alcohol Use: No  . Drug Use: No  . Sexual Activity: Not on file   Other Topics Concern  . Not on file   Social History Narrative   Patient lives at home with his wife    Patient drinks coffee daily    Family History: Family History  Problem Relation Age  of Onset  . Heart attack Father   . Heart attack Brother 46  . Stroke Sister   . Dementia Sister      Review of Systems: All other systems reviewed and are otherwise negative except as noted above.   Physical Exam: VS:  BP 110/50 mmHg  Pulse 68  Ht 5\' 8"  (1.727 m)  Wt 173 lb 3.2 oz (78.563 kg)  BMI 26.34 kg/m2  SpO2 94% , BMI Body mass index is 26.34 kg/(m^2).  GEN- The patient is elderly appearing, alert and oriented x 3 today.   HEENT: normocephalic, atraumatic; sclera clear, conjunctiva pink; hearing intact; oropharynx clear; neck supple  Lungs- Clear to ausculation bilaterally, normal work of breathing.  No wheezes, rales, rhonchi Heart- Regular rate and rhythm, no murmurs, rubs or gallops  GI- soft, non-tender, non-distended, bowel sounds present   Extremities- no clubbing, cyanosis, or edema; DP/PT/radial pulses 2+ bilaterally MS- no significant deformity or atrophy Skin- warm and dry, no rash or lesion; PPM pocket well healed Psych- euthymic mood, full affect Neuro- strength and sensation are intact  PPM Interrogation- reviewed in detail today,  See PACEART report  EKG:  EKG is not ordered today.  Recent Labs: 01/27/2016: Magnesium 2.0 02/15/2016: B Natriuretic Peptide 52.5 04/13/2016: ALT 13* 04/22/2016: Hemoglobin 12.4*; Platelets 267; TSH 2.78 04/27/2016: BUN 20; Creat 1.00; Potassium 4.5; Sodium 137   Wt Readings from Last 3 Encounters:  04/29/16 173 lb 3.2 oz (78.563 kg)  04/27/16 170 lb (77.111 kg)  04/22/16 171 lb (77.565 kg)     Other studies Reviewed: Additional studies/ records that were reviewed today include: Dr Wynonia Lawman and Dr Olin Pia office notes  Assessment and Plan:  1.  Sinus node dysfunction Normal PPM function See Pace Art report No changes today Carelink followed by Dr Wynonia Lawman. Discussed pacemaker follow up with patient today.  He would prefer that Dr Wynonia Lawman follow his pacemaker in office as well as remote.  We are happy to see as needed.     2.  HTN Stable No change required today  3.  Paroxysmal atrial fibrillation AF burden 1.4% by device interrogation today Continue Warfarin for CHADS2VASC of at least 4 No bleeding issues Recent CBC stable    Current medicines are reviewed at length with the patient today.   The patient does not have concerns regarding his medicines.  The following changes were made today:  none  Labs/ tests ordered today include: none  No orders of the defined types were placed in this encounter.     Disposition:   Follow up with Dr Wynonia Lawman as scheduled, Dr Caryl Comes as needed   Signed, Chanetta Marshall, NP 04/29/2016 8:21 AM  [address] [place] [address] [phone] (office) [phone] (fax)

## 2016-04-29 ENCOUNTER — Encounter: Payer: Self-pay | Admitting: Internal Medicine

## 2016-04-29 ENCOUNTER — Encounter: Payer: Self-pay | Admitting: Nurse Practitioner

## 2016-04-29 ENCOUNTER — Ambulatory Visit (INDEPENDENT_AMBULATORY_CARE_PROVIDER_SITE_OTHER): Payer: Medicare Other | Admitting: Nurse Practitioner

## 2016-04-29 VITALS — BP 110/50 | HR 68 | Ht 68.0 in | Wt 173.2 lb

## 2016-04-29 DIAGNOSIS — J189 Pneumonia, unspecified organism: Secondary | ICD-10-CM | POA: Diagnosis not present

## 2016-04-29 DIAGNOSIS — I480 Paroxysmal atrial fibrillation: Secondary | ICD-10-CM | POA: Diagnosis not present

## 2016-04-29 DIAGNOSIS — I10 Essential (primary) hypertension: Secondary | ICD-10-CM | POA: Diagnosis not present

## 2016-04-29 DIAGNOSIS — I495 Sick sinus syndrome: Secondary | ICD-10-CM

## 2016-04-29 DIAGNOSIS — F0390 Unspecified dementia without behavioral disturbance: Secondary | ICD-10-CM | POA: Diagnosis not present

## 2016-04-29 DIAGNOSIS — I5042 Chronic combined systolic (congestive) and diastolic (congestive) heart failure: Secondary | ICD-10-CM | POA: Diagnosis not present

## 2016-04-29 DIAGNOSIS — J449 Chronic obstructive pulmonary disease, unspecified: Secondary | ICD-10-CM | POA: Diagnosis not present

## 2016-04-29 DIAGNOSIS — W57XXXD Bitten or stung by nonvenomous insect and other nonvenomous arthropods, subsequent encounter: Secondary | ICD-10-CM | POA: Diagnosis not present

## 2016-04-29 DIAGNOSIS — I110 Hypertensive heart disease with heart failure: Secondary | ICD-10-CM | POA: Diagnosis not present

## 2016-04-29 DIAGNOSIS — I2510 Atherosclerotic heart disease of native coronary artery without angina pectoris: Secondary | ICD-10-CM

## 2016-04-29 NOTE — Patient Instructions (Signed)
Medication Instructions:   Your physician recommends that you continue on your current medications as directed. Please refer to the Current Medication list given to you today.    If you need a refill on your cardiac medications before your next appointment, please call your pharmacy.  Labwork:  NONE ORDER TODAY    Testing/Procedures:  NONE ORDER TODAY    Follow-Up:  .CONTACT [facility] [phone] AS NEEDED FOR  ANY CARDIAC RELATED SYMPTOMS  Any Other Special Instructions Will Be Listed Below (If Applicable).

## 2016-04-30 DIAGNOSIS — J189 Pneumonia, unspecified organism: Secondary | ICD-10-CM | POA: Diagnosis not present

## 2016-04-30 DIAGNOSIS — W57XXXD Bitten or stung by nonvenomous insect and other nonvenomous arthropods, subsequent encounter: Secondary | ICD-10-CM | POA: Diagnosis not present

## 2016-04-30 DIAGNOSIS — J449 Chronic obstructive pulmonary disease, unspecified: Secondary | ICD-10-CM | POA: Diagnosis not present

## 2016-04-30 DIAGNOSIS — F0390 Unspecified dementia without behavioral disturbance: Secondary | ICD-10-CM | POA: Diagnosis not present

## 2016-04-30 DIAGNOSIS — I110 Hypertensive heart disease with heart failure: Secondary | ICD-10-CM | POA: Diagnosis not present

## 2016-04-30 DIAGNOSIS — I5042 Chronic combined systolic (congestive) and diastolic (congestive) heart failure: Secondary | ICD-10-CM | POA: Diagnosis not present

## 2016-05-01 ENCOUNTER — Other Ambulatory Visit: Payer: Self-pay | Admitting: *Deleted

## 2016-05-01 ENCOUNTER — Encounter: Payer: Self-pay | Admitting: *Deleted

## 2016-05-01 DIAGNOSIS — W57XXXD Bitten or stung by nonvenomous insect and other nonvenomous arthropods, subsequent encounter: Secondary | ICD-10-CM | POA: Diagnosis not present

## 2016-05-01 DIAGNOSIS — J189 Pneumonia, unspecified organism: Secondary | ICD-10-CM | POA: Diagnosis not present

## 2016-05-01 DIAGNOSIS — I5042 Chronic combined systolic (congestive) and diastolic (congestive) heart failure: Secondary | ICD-10-CM | POA: Diagnosis not present

## 2016-05-01 DIAGNOSIS — J449 Chronic obstructive pulmonary disease, unspecified: Secondary | ICD-10-CM | POA: Diagnosis not present

## 2016-05-01 DIAGNOSIS — I110 Hypertensive heart disease with heart failure: Secondary | ICD-10-CM | POA: Diagnosis not present

## 2016-05-01 DIAGNOSIS — F0390 Unspecified dementia without behavioral disturbance: Secondary | ICD-10-CM | POA: Diagnosis not present

## 2016-05-01 NOTE — Patient Outreach (Signed)
Paul Davies) Care Management   05/01/2016  Paul Davies 15-Oct-1930 [id number]  Paul Davies is an 82 y.o. male  Subjective:  I am doing really good, I've been up and out early this morning. Patient reports he has received okay from doctor to drive his tractor, Mrs.Venning reports patient drove his truck to store on this morning. Patient discussed that he is feeling stronger and feel pretty good all the time. Patient discussed his recent visit to PCP due to lower blood pressure and medication adjustment Patient reports using his cane for ambulation. Mrs.Levert discussed that patient has not had his coumadin blood work checked on this week, she says home health RN has been checking blood and sending report to [place].   Objective:  BP 108/70 mmHg  Pulse 60  Resp 18  SpO2 97%  Patient sitting outside on arrival to visit. Review of Systems  Constitutional: Negative.   HENT: Negative.   Eyes: Negative.   Respiratory: Negative.   Cardiovascular: Negative.  Negative for leg swelling.  Gastrointestinal: Negative.   Genitourinary: Negative.   Musculoskeletal: Negative.  Negative for falls.  Skin: Negative.   Neurological: Negative.   Psychiatric/Behavioral: Positive for memory loss.       Some forgetfulness    Physical Exam  Constitutional: He is oriented to person, place, and time. He appears well-developed and well-nourished.  Cardiovascular: Normal rate.   Respiratory: Effort normal.  GI: Soft.  Musculoskeletal:  Ambulating with a cane  Neurological: He is alert and oriented to person, place, and time.  Skin: Skin is warm and dry.  Psychiatric: He has a normal mood and affect. His behavior is normal. Judgment and thought content normal.   Patient was recently discharged from hospital and all medications have been reviewed. Encounter Medications:   Outpatient Encounter Prescriptions as of 05/01/2016  Medication Sig  . amiodarone (PACERONE) 200 MG  tablet Take 100 mg by mouth 2 (two) times daily. Taking half tablet daily  . cholecalciferol (VITAMIN D) 1000 UNITS tablet Take 2,000 Units by mouth daily.   . hydrocortisone 2.5 % cream Apply 1 application topically daily as needed (inflammation on face). To face  . memantine (NAMENDA XR) 28 MG CP24 24 hr capsule Take 28 mg by mouth daily.  Marland Kitchen METOPROLOL SUCCINATE ER PO Take 25 mg by mouth daily.  Marland Kitchen SYNTHROID 50 MCG tablet Take 50 mcg by mouth daily before breakfast.   . warfarin (COUMADIN) 4 MG tablet Take 4 mg by mouth daily.   No facility-administered encounter medications on file as of 05/01/2016.    Functional Status:   In your present state of health, do you have any difficulty performing the following activities: 04/17/2016 04/14/2016  Hearing? Tempie Donning  Vision? N N  Difficulty concentrating or making decisions? Tempie Donning  Walking or climbing stairs? Y Y  Dressing or bathing? N N  Doing errands, shopping? Tempie Donning  Preparing Food and eating ? Y -  Using the Toilet? - -  In the past six months, have you accidently leaked urine? Y -  Do you have problems with loss of bowel control? N -  Managing your Medications? Y -  Managing your Finances? Y -  Housekeeping or managing your Housekeeping? Y -   Fall Risk  04/17/2016 03/16/2016 03/16/2016 09/26/2015 12/19/2014  Falls in the past year? Yes Yes No No No  Number falls in past yr: 2 or more 1 - - -  Injury with Fall? No No - - -  Risk Factor Category  High Fall Risk - - - -  Risk for fall due to : Impaired balance/gait History of fall(s) - History of fall(s) -  Follow up Falls prevention discussed Falls prevention discussed - - -   Fall/Depression Screening:    PHQ 2/9 Scores 05/01/2016 09/26/2015 12/19/2014 09/18/2014 12/11/2013  PHQ - 2 Score 0 0 0 0 0    Assessment:  Initial home visit, Mrs.Pavek present. Mr.Gaeta discussed home he enjoys working out in the garden, educated on avoiding the working outside during the heat of the day,an encouraged to  drink fluids throughout the day. Mr.Hlad is still followed Home health RN, PT,OT and speech, patient denies having any swallowing problems and tolerating diet well since recent admission.    Hypertension/Hypotension patient without symptoms, blood pressure has been monitored by home health this week,no further lower blood pressure noted. Mrs.General will being to monitor blood pressure weekly and notify MD  Mrs.Silvey helps patient with his medication daily as prescribed.  History of Heart Failure-  No swelling shortness of breath, patient not weighing, caregiver will remind patient regarding weighing. Care management education on self care management .  Fall risk - patient active with home health physical therapy and occupational therapy, patient observed using his cane.Fall education reviewed.  Plan:  RN will continue to provide care management and education post hospitalization , during transition of care, with weekly outreaches, next call to patient in one week.  Patient centered care goals reviewed and updated. RN placed call to Dr.Tilley's office that manages coumadin, to find out when next coumadin lab work is due, discussed with Brandie, next lab due 6/13, to be drawn by home health RN, patient and caregiver informed. Patient will continue to use his cane for safety, provide EMMI education on fall prevention, and Hypertension.   [facility] CM Care Plan Problem One        Most Recent Value   Care Plan Problem One  Recent hospital admission   Role Documenting the Problem One  Care Management [place] for Problem One  Active   THN Long Term Goal (31-90 days)  Patient will not experience a hospital admission in the next 31 days   THN Long Term Goal Start Date  04/17/16   Interventions for Problem One Long Term Goal  Reinforced with patient/caregiver to notify MD of new symptoms sooner to arrange an office visit   THN CM Short Term Goal #1 (0-30 days)  Patient will attend PCP  follow up appointment in the next 14 days   THN CM Short Term Goal #1 Start Date  04/17/16   [facility] CM Short Term Goal #1 Met Date  04/23/16   THN CM Short Term Goal #2 (0-30 days)  Patient will complete antiboitic therapy full prescription in the next 30 days   THN CM Short Term Goal #2 Start Date  04/17/16   [facility] CM Short Term Goal #2 Met Date  05/01/16   THN CM Short Term Goal #3 (0-30 days)  Patient will adhere to safety precautions of using his walker as much as possible  in the next 30 days   THN CM Short Term Goal #3 Start Date  04/17/16   Interventions for Short Tern Goal #3  Provided and reviewed EMMI hanout on prevenitng falls,   THN CM Short Term Goal #4 (0-30 days)  Caregiver will monitor and record patient blood pressure at least 2 times a week in the next 30 days  THN CM Short Term Goal #4 Start Date  04/23/16   Interventions for Short Term Goal #4  Reinforced importance of monitoring blood pressure,keeping a record  and notifying MD of concerns related to low blood pressure or symptoms, veriifed that caregiver has blood pressure monitor and knows how ot use it, Reviewed EMMI education on hypertension    THN CM Short Term Goal #5 (0-30 days)  Patient and caregiver will state understanding heart failure symptoms and actions to take in yellow zone   [facility] CM Short Term Goal #5 Start Date  05/01/16   Interventions for Short Term Goal #5  Reviewed heart failure zone education, provided and reviewed education in [facility] book      Joylene Draft, RN, [place] Management [phone]- Mobile [phone]- [place]

## 2016-05-05 DIAGNOSIS — I5042 Chronic combined systolic (congestive) and diastolic (congestive) heart failure: Secondary | ICD-10-CM | POA: Diagnosis not present

## 2016-05-05 DIAGNOSIS — F0390 Unspecified dementia without behavioral disturbance: Secondary | ICD-10-CM | POA: Diagnosis not present

## 2016-05-05 DIAGNOSIS — J449 Chronic obstructive pulmonary disease, unspecified: Secondary | ICD-10-CM | POA: Diagnosis not present

## 2016-05-05 DIAGNOSIS — J189 Pneumonia, unspecified organism: Secondary | ICD-10-CM | POA: Diagnosis not present

## 2016-05-05 DIAGNOSIS — W57XXXD Bitten or stung by nonvenomous insect and other nonvenomous arthropods, subsequent encounter: Secondary | ICD-10-CM | POA: Diagnosis not present

## 2016-05-05 DIAGNOSIS — I110 Hypertensive heart disease with heart failure: Secondary | ICD-10-CM | POA: Diagnosis not present

## 2016-05-06 ENCOUNTER — Ambulatory Visit (HOSPITAL_COMMUNITY)
Admission: RE | Admit: 2016-05-06 | Discharge: 2016-05-06 | Disposition: A | Discharge status: Home / Self Care | Payer: Medicare Other | Source: Ambulatory Visit | Attending: Internal Medicine | Admitting: Internal Medicine

## 2016-05-06 DIAGNOSIS — J189 Pneumonia, unspecified organism: Secondary | ICD-10-CM | POA: Diagnosis not present

## 2016-05-06 DIAGNOSIS — R9389 Abnormal findings on diagnostic imaging of other specified body structures: Secondary | ICD-10-CM

## 2016-05-06 DIAGNOSIS — R938 Abnormal findings on diagnostic imaging of other specified body structures: Secondary | ICD-10-CM | POA: Diagnosis not present

## 2016-05-06 DIAGNOSIS — I10 Essential (primary) hypertension: Secondary | ICD-10-CM | POA: Diagnosis not present

## 2016-05-06 DIAGNOSIS — H90A31 Mixed conductive and sensorineural hearing loss, unilateral, right ear with restricted hearing on the contralateral side: Secondary | ICD-10-CM | POA: Diagnosis not present

## 2016-05-07 ENCOUNTER — Other Ambulatory Visit: Payer: Self-pay | Admitting: *Deleted

## 2016-05-07 DIAGNOSIS — I110 Hypertensive heart disease with heart failure: Secondary | ICD-10-CM | POA: Diagnosis not present

## 2016-05-07 DIAGNOSIS — J449 Chronic obstructive pulmonary disease, unspecified: Secondary | ICD-10-CM | POA: Diagnosis not present

## 2016-05-07 DIAGNOSIS — J189 Pneumonia, unspecified organism: Secondary | ICD-10-CM | POA: Diagnosis not present

## 2016-05-07 DIAGNOSIS — W57XXXD Bitten or stung by nonvenomous insect and other nonvenomous arthropods, subsequent encounter: Secondary | ICD-10-CM | POA: Diagnosis not present

## 2016-05-07 DIAGNOSIS — F0390 Unspecified dementia without behavioral disturbance: Secondary | ICD-10-CM | POA: Diagnosis not present

## 2016-05-07 DIAGNOSIS — I5042 Chronic combined systolic (congestive) and diastolic (congestive) heart failure: Secondary | ICD-10-CM | POA: Diagnosis not present

## 2016-05-07 NOTE — Patient Outreach (Signed)
[place] [facility]) Care Management  05/07/2016  SCOTTIE METAYER 11/15/1930 [id number]   Transition of care call  RN placed call to Mr.Humbe, able to speak with his wife Lenell Antu, she reports that patient is doing okay. Mrs.Pedigo reports that patient recent chest xray was clear, she expressed being thankful for that. She reports Mr.Karger is continuing to tolerate his activity with physical therapy and speech and home health RN checks his INR and calls it to [place] for changes in dosage, Mrs.Thibeaux discussed recent changes to coumadin doses and voiced understanding.  Mrs.Hedberg is monitoring patient blood pressure and reports a reading today of 113/50, she reports patient has not experienced any increased shortness of breath or swelling. Mr.Pantano is not weighing at home at present,discussed benefit of tracking weight regularly, declined at present.  Mrs.Francois denies any new concerns at this time.  Plan Will continue with weekly outreaches as part of transition of care program.next phone call within next week. Will review and update progression on patient centered goals for care management .   [facility] CM Care Plan Problem One        Most Recent Value   Care Plan Problem One  Recent hospital admission   Role Documenting the Problem One  Care Management [place] for Problem One  Active   THN Long Term Goal (31-90 days)  Patient will not experience a hospital admission in the next 31 days   THN Long Term Goal Start Date  04/17/16   Interventions for Problem One Long Term Goal  Discussed with caregiver to importance of  MD/RN of concerns related to new any symptoms or questions she has related to patient's care, medications     THN CM Short Term Goal #1 (0-30 days)  Patient will attend PCP follow up appointment in the next 14 days   THN CM Short Term Goal #1 Start Date  04/17/16   [facility] CM Short Term Goal #1 Met Date  04/23/16   THN CM Short Term Goal #2 (0-30 days)   Patient will complete antiboitic therapy full prescription in the next 30 days   THN CM Short Term Goal #2 Start Date  04/17/16   [facility] CM Short Term Goal #2 Met Date  05/01/16   THN CM Short Term Goal #3 (0-30 days)  Patient will adhere to safety precautions of using his walker as much as possible  in the next 30 days   THN CM Short Term Goal #3 Start Date  04/17/16   Interventions for Short Tern Goal #3  Reinforced safety precautions of using assistive device and continuing exercises taught by therapy.   THN CM Short Term Goal #4 (0-30 days)  Caregiver will monitor and record patient blood pressure at least 2 times a week in the next 30 days   THN CM Short Term Goal #4 Start Date  04/23/16   Interventions for Short Term Goal #4  Reviewed importance of monitoring blood pressure,keeping a record  and notifying MD of concerns related to low blood pressure or symptoms, veriifed that caregiver has blood pressure monitor and knows how ot use it, Reviewed EMMI education on hypertension    THN CM Short Term Goal #5 (0-30 days)  Patient and caregiver will state understanding heart failure symptoms and actions to take in yellow zone   [facility] CM Short Term Goal #5 Start Date  05/01/16   Interventions for Short Term Goal #5  Discussed  heart failure zone, and identiied  patient current zone of green on today      Joylene Draft, RN, [place] Management [phone]- Mobile [phone]- [place]

## 2016-05-11 DIAGNOSIS — J189 Pneumonia, unspecified organism: Secondary | ICD-10-CM | POA: Diagnosis not present

## 2016-05-11 DIAGNOSIS — I110 Hypertensive heart disease with heart failure: Secondary | ICD-10-CM | POA: Diagnosis not present

## 2016-05-11 DIAGNOSIS — F0390 Unspecified dementia without behavioral disturbance: Secondary | ICD-10-CM | POA: Diagnosis not present

## 2016-05-11 DIAGNOSIS — I5042 Chronic combined systolic (congestive) and diastolic (congestive) heart failure: Secondary | ICD-10-CM | POA: Diagnosis not present

## 2016-05-11 DIAGNOSIS — W57XXXD Bitten or stung by nonvenomous insect and other nonvenomous arthropods, subsequent encounter: Secondary | ICD-10-CM | POA: Diagnosis not present

## 2016-05-11 DIAGNOSIS — J449 Chronic obstructive pulmonary disease, unspecified: Secondary | ICD-10-CM | POA: Diagnosis not present

## 2016-05-12 DIAGNOSIS — F0390 Unspecified dementia without behavioral disturbance: Secondary | ICD-10-CM | POA: Diagnosis not present

## 2016-05-12 DIAGNOSIS — J189 Pneumonia, unspecified organism: Secondary | ICD-10-CM | POA: Diagnosis not present

## 2016-05-12 DIAGNOSIS — W57XXXD Bitten or stung by nonvenomous insect and other nonvenomous arthropods, subsequent encounter: Secondary | ICD-10-CM | POA: Diagnosis not present

## 2016-05-12 DIAGNOSIS — J449 Chronic obstructive pulmonary disease, unspecified: Secondary | ICD-10-CM | POA: Diagnosis not present

## 2016-05-12 DIAGNOSIS — I5042 Chronic combined systolic (congestive) and diastolic (congestive) heart failure: Secondary | ICD-10-CM | POA: Diagnosis not present

## 2016-05-12 DIAGNOSIS — I110 Hypertensive heart disease with heart failure: Secondary | ICD-10-CM | POA: Diagnosis not present

## 2016-05-13 DIAGNOSIS — W57XXXD Bitten or stung by nonvenomous insect and other nonvenomous arthropods, subsequent encounter: Secondary | ICD-10-CM | POA: Diagnosis not present

## 2016-05-13 DIAGNOSIS — I5042 Chronic combined systolic (congestive) and diastolic (congestive) heart failure: Secondary | ICD-10-CM | POA: Diagnosis not present

## 2016-05-13 DIAGNOSIS — I110 Hypertensive heart disease with heart failure: Secondary | ICD-10-CM | POA: Diagnosis not present

## 2016-05-13 DIAGNOSIS — J189 Pneumonia, unspecified organism: Secondary | ICD-10-CM | POA: Diagnosis not present

## 2016-05-13 DIAGNOSIS — F0390 Unspecified dementia without behavioral disturbance: Secondary | ICD-10-CM | POA: Diagnosis not present

## 2016-05-13 DIAGNOSIS — J449 Chronic obstructive pulmonary disease, unspecified: Secondary | ICD-10-CM | POA: Diagnosis not present

## 2016-05-14 DIAGNOSIS — J449 Chronic obstructive pulmonary disease, unspecified: Secondary | ICD-10-CM | POA: Diagnosis not present

## 2016-05-14 DIAGNOSIS — I110 Hypertensive heart disease with heart failure: Secondary | ICD-10-CM | POA: Diagnosis not present

## 2016-05-14 DIAGNOSIS — J189 Pneumonia, unspecified organism: Secondary | ICD-10-CM | POA: Diagnosis not present

## 2016-05-14 DIAGNOSIS — I5042 Chronic combined systolic (congestive) and diastolic (congestive) heart failure: Secondary | ICD-10-CM | POA: Diagnosis not present

## 2016-05-14 DIAGNOSIS — F0390 Unspecified dementia without behavioral disturbance: Secondary | ICD-10-CM | POA: Diagnosis not present

## 2016-05-14 DIAGNOSIS — W57XXXD Bitten or stung by nonvenomous insect and other nonvenomous arthropods, subsequent encounter: Secondary | ICD-10-CM | POA: Diagnosis not present

## 2016-05-15 ENCOUNTER — Other Ambulatory Visit: Payer: Self-pay | Admitting: *Deleted

## 2016-05-15 NOTE — Patient Outreach (Signed)
[place] [facility]) Care Management  05/15/2016  Paul Davies Apr 10, 1930 [id number]   Transition of care call  RN placed call to Paul Davies as part of the transition of care program, able to speak with Paul Davies,HIPAA verified. Paul Davies report that patient is having some good days, today he has been outside looking over his fences, currently he is eating lunch. She reports that patient is tolerating meals well,and has a good appetite,he is still followed by home health RN,physical therapy and speech therapist.  Paul Davies continues to help patient with medications and checking his blood pressure, she reports recent readings have been 121/71 and 136/82, denies patient complaint of dizziness and no recurrent falls. Reinforced with caregiver to caution patient regarding working outside during the hottest part of the day.  Paul Davies's weight is staying in the 172 range, without increased in swelling or shortness of breath.  Discussed nearing the end of transition of care program, discussed options for continued community care manager visits and health coach for chronic disease management Paul Davies denies any new concerns at this time, discussed upcoming office visit with [place].   Plan RN will follow up with patient on next for l transition of care call on next week Caregiver will continue to monitor blood pressure, weights and notify MD of worsening symptoms.   THN CM Care Plan Problem One        Most Recent Value   Care Plan Problem One  Recent hospital admission   Role Documenting the Problem One  Care Management [place] for Problem One  Active   THN Long Term Goal (31-90 days)  Patient will not experience a hospital admission in the next 31 days   THN Long Term Goal Start Date  04/17/16   Interventions for Problem One Long Term Goal  Discussed with caregiver to importance of  MD/RN of concerns related to new any symptoms or questions she has related  to patient's care, medications     THN CM Short Term Goal #1 (0-30 days)  Patient will attend PCP follow up appointment in the next 14 days   THN CM Short Term Goal #1 Start Date  04/17/16   [facility] CM Short Term Goal #1 Met Date  04/23/16   THN CM Short Term Goal #2 (0-30 days)  Patient will complete antiboitic therapy full prescription in the next 30 days   THN CM Short Term Goal #2 Start Date  04/17/16   [facility] CM Short Term Goal #2 Met Date  05/01/16   THN CM Short Term Goal #3 (0-30 days)  Patient will adhere to safety precautions of using his walker as much as possible  in the next 30 days   THN CM Short Term Goal #3 Start Date  04/17/16   Interventions for Short Tern Goal #3  Reviewed importance of patient using cane, at all times, and avoiding working outside during the heat of the day   THN CM Short Term Goal #4 (0-30 days)  Caregiver will monitor and record patient blood pressure at least 2 times a week in the next 30 days   THN CM Short Term Goal #4 Start Date  04/23/16   [facility] CM Short Term Goal #4 Met Date  05/15/16   THN CM Short Term Goal #5 (0-30 days)  Patient and caregiver will state understanding heart failure symptoms and actions to take in yellow zone   [facility] CM Short Term Goal #5 Start Date  05/01/16   Interventions  for Short Term Goal #5  Reinforced importance of continuing to monitor weights daily and notify MD of weight gain or other symptom in COPD yellow zone.     Joylene Draft, RN, [place] Management [phone]- Mobile [phone]- Toll Free Main Office

## 2016-05-18 DIAGNOSIS — F0390 Unspecified dementia without behavioral disturbance: Secondary | ICD-10-CM | POA: Diagnosis not present

## 2016-05-18 DIAGNOSIS — I5042 Chronic combined systolic (congestive) and diastolic (congestive) heart failure: Secondary | ICD-10-CM | POA: Diagnosis not present

## 2016-05-18 DIAGNOSIS — I110 Hypertensive heart disease with heart failure: Secondary | ICD-10-CM | POA: Diagnosis not present

## 2016-05-18 DIAGNOSIS — W57XXXD Bitten or stung by nonvenomous insect and other nonvenomous arthropods, subsequent encounter: Secondary | ICD-10-CM | POA: Diagnosis not present

## 2016-05-18 DIAGNOSIS — J189 Pneumonia, unspecified organism: Secondary | ICD-10-CM | POA: Diagnosis not present

## 2016-05-18 DIAGNOSIS — J449 Chronic obstructive pulmonary disease, unspecified: Secondary | ICD-10-CM | POA: Diagnosis not present

## 2016-05-19 DIAGNOSIS — J189 Pneumonia, unspecified organism: Secondary | ICD-10-CM | POA: Diagnosis not present

## 2016-05-19 DIAGNOSIS — I5042 Chronic combined systolic (congestive) and diastolic (congestive) heart failure: Secondary | ICD-10-CM | POA: Diagnosis not present

## 2016-05-19 DIAGNOSIS — W57XXXD Bitten or stung by nonvenomous insect and other nonvenomous arthropods, subsequent encounter: Secondary | ICD-10-CM | POA: Diagnosis not present

## 2016-05-19 DIAGNOSIS — J449 Chronic obstructive pulmonary disease, unspecified: Secondary | ICD-10-CM | POA: Diagnosis not present

## 2016-05-19 DIAGNOSIS — F0390 Unspecified dementia without behavioral disturbance: Secondary | ICD-10-CM | POA: Diagnosis not present

## 2016-05-19 DIAGNOSIS — I110 Hypertensive heart disease with heart failure: Secondary | ICD-10-CM | POA: Diagnosis not present

## 2016-05-20 DIAGNOSIS — I5042 Chronic combined systolic (congestive) and diastolic (congestive) heart failure: Secondary | ICD-10-CM | POA: Diagnosis not present

## 2016-05-20 DIAGNOSIS — F0390 Unspecified dementia without behavioral disturbance: Secondary | ICD-10-CM | POA: Diagnosis not present

## 2016-05-20 DIAGNOSIS — J449 Chronic obstructive pulmonary disease, unspecified: Secondary | ICD-10-CM | POA: Diagnosis not present

## 2016-05-20 DIAGNOSIS — J189 Pneumonia, unspecified organism: Secondary | ICD-10-CM | POA: Diagnosis not present

## 2016-05-20 DIAGNOSIS — I110 Hypertensive heart disease with heart failure: Secondary | ICD-10-CM | POA: Diagnosis not present

## 2016-05-20 DIAGNOSIS — W57XXXD Bitten or stung by nonvenomous insect and other nonvenomous arthropods, subsequent encounter: Secondary | ICD-10-CM | POA: Diagnosis not present

## 2016-05-21 ENCOUNTER — Other Ambulatory Visit: Payer: Self-pay | Admitting: *Deleted

## 2016-05-21 DIAGNOSIS — J449 Chronic obstructive pulmonary disease, unspecified: Secondary | ICD-10-CM | POA: Diagnosis not present

## 2016-05-21 DIAGNOSIS — I5042 Chronic combined systolic (congestive) and diastolic (congestive) heart failure: Secondary | ICD-10-CM | POA: Diagnosis not present

## 2016-05-21 DIAGNOSIS — W57XXXD Bitten or stung by nonvenomous insect and other nonvenomous arthropods, subsequent encounter: Secondary | ICD-10-CM | POA: Diagnosis not present

## 2016-05-21 DIAGNOSIS — J189 Pneumonia, unspecified organism: Secondary | ICD-10-CM | POA: Diagnosis not present

## 2016-05-21 DIAGNOSIS — F0390 Unspecified dementia without behavioral disturbance: Secondary | ICD-10-CM | POA: Diagnosis not present

## 2016-05-21 DIAGNOSIS — I110 Hypertensive heart disease with heart failure: Secondary | ICD-10-CM | POA: Diagnosis not present

## 2016-05-21 NOTE — Patient Outreach (Signed)
[place] [facility]) Care Management  05/21/2016  SAHAND GOSCH 1930/03/02 [id number]   Transition of care  RN placed call to patient , able to speak with is wife, HIPAA verified. Mrs.Mickle states patient is doing pretty good, able to tolerate working in his garden. Patient is still followed by home health RN, physical therapy goals met and today will be their last visit.  Mrs.Voight reports patient is tolerating diet fairly well and still receiving home visit from speech therapist.  Mr.Pinson blood pressure is 117/70 today, denies episodes of low blood pressure or complaint of dizziness. Mrs.Minniefield continues to manage patients medication. Mrs.Morath weight today is 170.6 no reported increases greater than 3 pounds in a day or 5 in week, no symptoms of shortness of breath or swelling.  Mrs.Helmers denies any new concerns, discussed visit with Dr.Tilley on next week.  Plan Reviewed  transition of care program and planned one more phone call on next week, if no concerns at that time will plan to close case to community care management and possible transition to health coach for continued management of chronic disease .   [facility] CM Care Plan Problem One        Most Recent Value   Care Plan Problem One  Recent hospital admission   Role Documenting the Problem One  Care Management [place] for Problem One  Active   THN Long Term Goal (31-90 days)  Patient will not experience a hospital admission in the next 31 days   THN Long Term Goal Start Date  04/17/16   [facility] Long Term Goal Met Date  05/21/16   THN CM Short Term Goal #1 (0-30 days)  Patient will attend PCP follow up appointment in the next 14 days   THN CM Short Term Goal #1 Start Date  04/17/16   [facility] CM Short Term Goal #1 Met Date  04/23/16   THN CM Short Term Goal #2 (0-30 days)  Patient will complete antiboitic therapy full prescription in the next 30 days   THN CM Short Term Goal #2 Start Date  04/17/16   [facility] CM Short Term Goal #2 Met Date  05/01/16   THN CM Short Term Goal #3 (0-30 days)  Patient will adhere to safety precautions of using his cane as much as possible  in the next 30 days [care plan goal]   THN CM Short Term Goal #3 Start Date  04/17/16   [facility] CM Short Term Goal #3 Met Date  05/21/16   THN CM Short Term Goal #4 (0-30 days)  Caregiver will monitor and record patient blood pressure at least 2 times a week in the next 30 days   THN CM Short Term Goal #4 Start Date  04/23/16   [facility] CM Short Term Goal #4 Met Date  05/15/16   THN CM Short Term Goal #5 (0-30 days)  Patient and caregiver will state understanding heart failure symptoms and actions to take in yellow zone   THN CM Short Term Goal #5 Start Date  05/01/16   Interventions for Short Term Goal #5  Reviewed importance of continuing to monitor weights daily and notify MD of weight gain or other symptom in heart failure yellow zone.     Joylene Draft, RN, [place] Management [phone]- Mobile [phone]- Toll Free Main Office

## 2016-05-25 DIAGNOSIS — E785 Hyperlipidemia, unspecified: Secondary | ICD-10-CM | POA: Diagnosis not present

## 2016-05-25 DIAGNOSIS — N401 Enlarged prostate with lower urinary tract symptoms: Secondary | ICD-10-CM | POA: Diagnosis not present

## 2016-05-25 DIAGNOSIS — I480 Paroxysmal atrial fibrillation: Secondary | ICD-10-CM | POA: Diagnosis not present

## 2016-05-25 DIAGNOSIS — Z950 Presence of cardiac pacemaker: Secondary | ICD-10-CM | POA: Diagnosis not present

## 2016-05-25 DIAGNOSIS — G4733 Obstructive sleep apnea (adult) (pediatric): Secondary | ICD-10-CM | POA: Diagnosis not present

## 2016-05-25 DIAGNOSIS — E039 Hypothyroidism, unspecified: Secondary | ICD-10-CM | POA: Diagnosis not present

## 2016-05-25 DIAGNOSIS — N3941 Urge incontinence: Secondary | ICD-10-CM | POA: Diagnosis not present

## 2016-05-25 DIAGNOSIS — I951 Orthostatic hypotension: Secondary | ICD-10-CM | POA: Diagnosis not present

## 2016-05-25 DIAGNOSIS — Z8673 Personal history of transient ischemic attack (TIA), and cerebral infarction without residual deficits: Secondary | ICD-10-CM | POA: Diagnosis not present

## 2016-05-25 DIAGNOSIS — Z7901 Long term (current) use of anticoagulants: Secondary | ICD-10-CM | POA: Diagnosis not present

## 2016-05-27 ENCOUNTER — Other Ambulatory Visit: Payer: Self-pay | Admitting: *Deleted

## 2016-05-27 ENCOUNTER — Telehealth: Payer: Self-pay | Admitting: *Deleted

## 2016-05-27 DIAGNOSIS — F0280 Dementia in other diseases classified elsewhere without behavioral disturbance: Secondary | ICD-10-CM

## 2016-05-27 DIAGNOSIS — I10 Essential (primary) hypertension: Secondary | ICD-10-CM

## 2016-05-27 DIAGNOSIS — I5042 Chronic combined systolic (congestive) and diastolic (congestive) heart failure: Secondary | ICD-10-CM

## 2016-05-27 DIAGNOSIS — I482 Chronic atrial fibrillation, unspecified: Secondary | ICD-10-CM

## 2016-05-27 DIAGNOSIS — Z950 Presence of cardiac pacemaker: Secondary | ICD-10-CM

## 2016-05-27 DIAGNOSIS — I110 Hypertensive heart disease with heart failure: Secondary | ICD-10-CM | POA: Diagnosis not present

## 2016-05-27 DIAGNOSIS — J449 Chronic obstructive pulmonary disease, unspecified: Secondary | ICD-10-CM | POA: Diagnosis not present

## 2016-05-27 DIAGNOSIS — G309 Alzheimer's disease, unspecified: Secondary | ICD-10-CM

## 2016-05-27 DIAGNOSIS — F0390 Unspecified dementia without behavioral disturbance: Secondary | ICD-10-CM | POA: Diagnosis not present

## 2016-05-27 DIAGNOSIS — J189 Pneumonia, unspecified organism: Secondary | ICD-10-CM | POA: Diagnosis not present

## 2016-05-27 DIAGNOSIS — W57XXXD Bitten or stung by nonvenomous insect and other nonvenomous arthropods, subsequent encounter: Secondary | ICD-10-CM | POA: Diagnosis not present

## 2016-05-27 NOTE — Telephone Encounter (Signed)
Erin From [facility] called to request an order for the conclusion of the patient's therapy.  She requested 1 visit for this week and 2 visits x 2 weeks, with possible discharge prior to the 2 week visits. Per Dr Melford Aase, [facility] to finish therapy.

## 2016-05-27 NOTE — Patient Outreach (Signed)
[facility]) Care Management  05/27/2016  Paul Davies 1930-11-21 [id number]   Transition of care  Weekly transition of care call to patient, able to speak with his wife, HIPAA verified. Paul Davies reports that patient is doing fine. Paul Davies discussed patient recent visit at Dr.Tilley's and patient received a good report.  Patient is tolerating activity at home, working in the garden and plowing on the tractor, reinforced the importance of avoiding working in the heat of the day, Paul Davies states she reminds him of this every day.  Paul Davies is currently still followed by home health RN and speech therapy both which she reports will end in the next few weeks.  Paul Davies weight is 172 on today, and reports that his blood pressure has been running good, denies dizziness, shortness of breath or swelling. Paul Davies continues to assist with patient medications, providing transportation to appointments, she is able to state worsening symptoms and concerns to notify MD of. Paul Davies denies any new concern or need for community care manager home visit. Discussed transition of care program had completed and goals met, offered continued involvement with [facility] health coach of education and management of chronic disease, Heart Failure , she is in agreement. Encouraged caregiver to notify MD of new or worsening symptoms or concerns.  Plan Will close case to community care management and enter referral for health coach.

## 2016-05-28 ENCOUNTER — Encounter: Payer: Self-pay | Admitting: *Deleted

## 2016-05-29 ENCOUNTER — Ambulatory Visit (INDEPENDENT_AMBULATORY_CARE_PROVIDER_SITE_OTHER): Payer: Medicare Other | Admitting: Internal Medicine

## 2016-05-29 ENCOUNTER — Encounter: Payer: Self-pay | Admitting: Internal Medicine

## 2016-05-29 VITALS — BP 106/58 | HR 64 | Temp 98.0°F | Resp 16 | Ht 68.0 in | Wt 170.0 lb

## 2016-05-29 DIAGNOSIS — J189 Pneumonia, unspecified organism: Secondary | ICD-10-CM | POA: Diagnosis not present

## 2016-05-29 DIAGNOSIS — F0280 Dementia in other diseases classified elsewhere without behavioral disturbance: Secondary | ICD-10-CM | POA: Diagnosis not present

## 2016-05-29 DIAGNOSIS — G300 Alzheimer's disease with early onset: Secondary | ICD-10-CM | POA: Diagnosis not present

## 2016-05-29 DIAGNOSIS — I10 Essential (primary) hypertension: Secondary | ICD-10-CM | POA: Diagnosis not present

## 2016-05-29 NOTE — Progress Notes (Signed)
Assessment and Plan:   1. Essential hypertension -still running mildly hypotensive here in office -no changes recommended however if does become symptomatic with hypotension consider decreasing medications  2. HCAP (healthcare-associated pneumonia) -apparently resolved  3. Early onset Alzheimer's dementia without behavioral disturbance -stop speech therapy -cont memory games and exercises -recommended reading short stories or crossword puzzles.   -cont meds  HPI 82 y.o.male presents for 1 month follow up of Hypertension and HCAP.  He and wife report that he has been doing well.  He is currently doing speech therapy twice weekly.  His wife feels like this is helping with his memory.  He is no longer doing physical therapy.  His blood pressure at home is generally 119/60s.  They do not check it every day.  He has no dizziness, fatigue, fainting, or chest pains, no shortness of breath.  He has recently seen [name] on Monday and he had no changes in medication.   male is taking their medication.  Past Medical History  Diagnosis Date  . Hyperlipidemia   . Hypertension   . RLS (restless legs syndrome)   . Osteopenia   . Hypothyroidism   . Vitamin D deficiency   . Pacemaker medtronic   . Atherosclerosis of aorta ([place]) 2010    noted on lumbar CT in 2010  . COPD (chronic obstructive pulmonary disease) ([place])     On CXR  . Sinoatrial node dysfunction (HCC)   . Prediabetes   . Atrial fibrillation ([place])   . Basal cell carcinoma, face   . History of right MCA stroke ~ 2002; 2006  . Hypothyroidism   . OSA on CPAP   . DJD (degenerative joint disease)   . Chronic sinusitis   . Pneumonia 01/28/2016  . Sepsis ([place]) 04/14/2016     Allergies  Allergen Reactions  . Bee Venom Anaphylaxis      Current Outpatient Prescriptions on File Prior to Visit  Medication Sig Dispense Refill  . amiodarone (PACERONE) 200 MG tablet Take 100 mg by mouth 2 (two) times daily. Taking half tablet daily     . cholecalciferol (VITAMIN D) 1000 UNITS tablet Take 2,000 Units by mouth daily.     . hydrocortisone 2.5 % cream Apply 1 application topically daily as needed (inflammation on face). To face    . memantine (NAMENDA XR) 28 MG CP24 24 hr capsule Take 28 mg by mouth daily.    Marland Kitchen METOPROLOL SUCCINATE ER PO Take 25 mg by mouth daily.    Marland Kitchen SYNTHROID 50 MCG tablet Take 50 mcg by mouth daily before breakfast.     . warfarin (COUMADIN) 4 MG tablet Take 4 mg by mouth daily. Except taking 2 mg on friday     No current facility-administered medications on file prior to visit.    ROS: all negative except above.   Physical Exam: Filed Weights   05/29/16 1034  Weight: 170 lb (77.111 kg)   BP 106/58 mmHg  Pulse 64  Temp(Src) 98 F (36.7 C) (Temporal)  Resp 16  Ht 5\' 8"  (1.727 m)  Wt 170 lb (77.111 kg)  BMI 25.85 kg/m2 General Appearance: Well developed well nourished, non-toxic appearing in no apparent distress. Eyes: PERRLA, EOMs, conjunctiva w/ no swelling or erythema or discharge Sinuses: No Frontal/maxillary tenderness ENT/Mouth: Ear canals clear without swelling or erythema.  TM's normal bilaterally with no retractions, bulging, or loss of landmarks.   Neck: Supple, thyroid normal, no notable JVD  Respiratory: Respiratory effort normal, Clear breath  sounds anteriorly and posteriorly bilaterally without rales, rhonchi, wheezing or stridor. No retractions or accessory muscle usage. Cardio: RRR with no MRGs.   Abdomen: Soft, + BS.  Non tender, no guarding, rebound, hernias, masses.  Musculoskeletal: Full ROM, 5/5 strength, normal gait.  Skin: Warm, dry without rashes  Neuro: Awake and oriented X 3, Cranial nerves intact. Normal muscle tone, no cerebellar symptoms. Sensation intact.  Psych: normal affect, Insight and Judgment appropriate.     Starlyn Skeans, PA-C 11:10 AM [facility]

## 2016-06-01 DIAGNOSIS — J449 Chronic obstructive pulmonary disease, unspecified: Secondary | ICD-10-CM | POA: Diagnosis not present

## 2016-06-01 DIAGNOSIS — W57XXXD Bitten or stung by nonvenomous insect and other nonvenomous arthropods, subsequent encounter: Secondary | ICD-10-CM | POA: Diagnosis not present

## 2016-06-01 DIAGNOSIS — I5042 Chronic combined systolic (congestive) and diastolic (congestive) heart failure: Secondary | ICD-10-CM | POA: Diagnosis not present

## 2016-06-01 DIAGNOSIS — F0390 Unspecified dementia without behavioral disturbance: Secondary | ICD-10-CM | POA: Diagnosis not present

## 2016-06-01 DIAGNOSIS — I110 Hypertensive heart disease with heart failure: Secondary | ICD-10-CM | POA: Diagnosis not present

## 2016-06-01 DIAGNOSIS — J189 Pneumonia, unspecified organism: Secondary | ICD-10-CM | POA: Diagnosis not present

## 2016-06-03 DIAGNOSIS — J449 Chronic obstructive pulmonary disease, unspecified: Secondary | ICD-10-CM | POA: Diagnosis not present

## 2016-06-03 DIAGNOSIS — J189 Pneumonia, unspecified organism: Secondary | ICD-10-CM | POA: Diagnosis not present

## 2016-06-03 DIAGNOSIS — F0390 Unspecified dementia without behavioral disturbance: Secondary | ICD-10-CM | POA: Diagnosis not present

## 2016-06-03 DIAGNOSIS — W57XXXD Bitten or stung by nonvenomous insect and other nonvenomous arthropods, subsequent encounter: Secondary | ICD-10-CM | POA: Diagnosis not present

## 2016-06-03 DIAGNOSIS — I110 Hypertensive heart disease with heart failure: Secondary | ICD-10-CM | POA: Diagnosis not present

## 2016-06-03 DIAGNOSIS — I5042 Chronic combined systolic (congestive) and diastolic (congestive) heart failure: Secondary | ICD-10-CM | POA: Diagnosis not present

## 2016-06-12 DIAGNOSIS — I5042 Chronic combined systolic (congestive) and diastolic (congestive) heart failure: Secondary | ICD-10-CM | POA: Diagnosis not present

## 2016-06-12 DIAGNOSIS — J449 Chronic obstructive pulmonary disease, unspecified: Secondary | ICD-10-CM | POA: Diagnosis not present

## 2016-06-12 DIAGNOSIS — W57XXXD Bitten or stung by nonvenomous insect and other nonvenomous arthropods, subsequent encounter: Secondary | ICD-10-CM | POA: Diagnosis not present

## 2016-06-12 DIAGNOSIS — I110 Hypertensive heart disease with heart failure: Secondary | ICD-10-CM | POA: Diagnosis not present

## 2016-06-12 DIAGNOSIS — F0390 Unspecified dementia without behavioral disturbance: Secondary | ICD-10-CM | POA: Diagnosis not present

## 2016-06-12 DIAGNOSIS — J189 Pneumonia, unspecified organism: Secondary | ICD-10-CM | POA: Diagnosis not present

## 2016-06-24 ENCOUNTER — Other Ambulatory Visit: Payer: Self-pay | Admitting: *Deleted

## 2016-06-24 DIAGNOSIS — G4733 Obstructive sleep apnea (adult) (pediatric): Secondary | ICD-10-CM | POA: Diagnosis not present

## 2016-06-24 DIAGNOSIS — Z8673 Personal history of transient ischemic attack (TIA), and cerebral infarction without residual deficits: Secondary | ICD-10-CM | POA: Diagnosis not present

## 2016-06-24 DIAGNOSIS — I951 Orthostatic hypotension: Secondary | ICD-10-CM | POA: Diagnosis not present

## 2016-06-24 DIAGNOSIS — E039 Hypothyroidism, unspecified: Secondary | ICD-10-CM | POA: Diagnosis not present

## 2016-06-24 DIAGNOSIS — Z950 Presence of cardiac pacemaker: Secondary | ICD-10-CM | POA: Diagnosis not present

## 2016-06-24 DIAGNOSIS — Z7901 Long term (current) use of anticoagulants: Secondary | ICD-10-CM | POA: Diagnosis not present

## 2016-06-24 DIAGNOSIS — E785 Hyperlipidemia, unspecified: Secondary | ICD-10-CM | POA: Diagnosis not present

## 2016-06-24 DIAGNOSIS — I480 Paroxysmal atrial fibrillation: Secondary | ICD-10-CM | POA: Diagnosis not present

## 2016-06-24 NOTE — Patient Outreach (Signed)
[place] [facility]) Care Management  06/24/2016  Paul Davies [facility] 10-09-1930 [id number]    RN Health Coach  Attempted initial  outreach call to patient.  Patient was unavailable. No voice mail pickup. Plan: RN will call patient again within 14 days.  [place] [facility] [phone]

## 2016-07-03 ENCOUNTER — Encounter: Payer: Self-pay | Admitting: *Deleted

## 2016-07-13 ENCOUNTER — Other Ambulatory Visit: Payer: Self-pay | Admitting: Internal Medicine

## 2016-07-21 NOTE — Telephone Encounter (Signed)
This encounter was created in error - please disregard.

## 2016-07-23 ENCOUNTER — Ambulatory Visit (INDEPENDENT_AMBULATORY_CARE_PROVIDER_SITE_OTHER): Payer: Medicare Other | Admitting: Internal Medicine

## 2016-07-23 ENCOUNTER — Encounter: Payer: Self-pay | Admitting: Internal Medicine

## 2016-07-23 VITALS — BP 104/58 | HR 64 | Temp 97.8°F | Resp 14 | Ht 68.0 in | Wt 170.0 lb

## 2016-07-23 DIAGNOSIS — Z8673 Personal history of transient ischemic attack (TIA), and cerebral infarction without residual deficits: Secondary | ICD-10-CM | POA: Diagnosis not present

## 2016-07-23 DIAGNOSIS — E559 Vitamin D deficiency, unspecified: Secondary | ICD-10-CM

## 2016-07-23 DIAGNOSIS — I700 Atherosclerosis of aorta: Secondary | ICD-10-CM | POA: Diagnosis not present

## 2016-07-23 DIAGNOSIS — E039 Hypothyroidism, unspecified: Secondary | ICD-10-CM

## 2016-07-23 DIAGNOSIS — I951 Orthostatic hypotension: Secondary | ICD-10-CM | POA: Diagnosis not present

## 2016-07-23 DIAGNOSIS — R7303 Prediabetes: Secondary | ICD-10-CM

## 2016-07-23 DIAGNOSIS — Z7901 Long term (current) use of anticoagulants: Secondary | ICD-10-CM

## 2016-07-23 DIAGNOSIS — I10 Essential (primary) hypertension: Secondary | ICD-10-CM

## 2016-07-23 DIAGNOSIS — E785 Hyperlipidemia, unspecified: Secondary | ICD-10-CM | POA: Diagnosis not present

## 2016-07-23 DIAGNOSIS — G4733 Obstructive sleep apnea (adult) (pediatric): Secondary | ICD-10-CM | POA: Diagnosis not present

## 2016-07-23 DIAGNOSIS — I480 Paroxysmal atrial fibrillation: Secondary | ICD-10-CM | POA: Diagnosis not present

## 2016-07-23 DIAGNOSIS — Z23 Encounter for immunization: Secondary | ICD-10-CM | POA: Diagnosis not present

## 2016-07-23 DIAGNOSIS — Z79899 Other long term (current) drug therapy: Secondary | ICD-10-CM | POA: Diagnosis not present

## 2016-07-23 DIAGNOSIS — Z950 Presence of cardiac pacemaker: Secondary | ICD-10-CM | POA: Diagnosis not present

## 2016-07-23 DIAGNOSIS — I5042 Chronic combined systolic (congestive) and diastolic (congestive) heart failure: Secondary | ICD-10-CM | POA: Diagnosis not present

## 2016-07-23 LAB — CBC WITH DIFFERENTIAL/PLATELET
BASOS PCT: 0 %
Basophils Absolute: 0 cells/uL (ref 0–200)
EOS ABS: 222 {cells}/uL (ref 15–500)
Eosinophils Relative: 3 %
HEMATOCRIT: 37.9 % — AB (ref 38.5–50.0)
HEMOGLOBIN: 12.7 g/dL — AB (ref 13.2–17.1)
LYMPHS PCT: 21 %
Lymphs Abs: 1554 cells/uL (ref 850–3900)
MCH: 30.6 pg (ref 27.0–33.0)
MCHC: 33.5 g/dL (ref 32.0–36.0)
MCV: 91.3 fL (ref 80.0–100.0)
MONO ABS: 888 {cells}/uL (ref 200–950)
MPV: 9.9 fL (ref 7.5–12.5)
Monocytes Relative: 12 %
NEUTROS PCT: 64 %
Neutro Abs: 4736 cells/uL (ref 1500–7800)
Platelets: 175 10*3/uL (ref 140–400)
RBC: 4.15 MIL/uL — ABNORMAL LOW (ref 4.20–5.80)
RDW: 14 % (ref 11.0–15.0)
WBC: 7.4 10*3/uL (ref 3.8–10.8)

## 2016-07-23 LAB — BASIC METABOLIC PANEL WITH GFR
BUN: 24 mg/dL (ref 7–25)
CO2: 24 mmol/L (ref 20–31)
Calcium: 9.1 mg/dL (ref 8.6–10.3)
Chloride: 105 mmol/L (ref 98–110)
Creat: 1.19 mg/dL — ABNORMAL HIGH (ref 0.70–1.11)
GFR, EST AFRICAN AMERICAN: 64 mL/min (ref >=60)
GFR, Est Non African American: 56 mL/min — ABNORMAL LOW (ref >=60)
GLUCOSE: 67 mg/dL (ref 65–99)
POTASSIUM: 4.6 mmol/L (ref 3.5–5.3)
Sodium: 140 mmol/L (ref 135–146)

## 2016-07-23 LAB — LIPID PANEL
CHOLESTEROL: 205 mg/dL — AB (ref 125–200)
HDL: 70 mg/dL (ref >=40)
LDL Cholesterol: 123 mg/dL (ref <130)
TRIGLYCERIDES: 59 mg/dL (ref <150)
Total CHOL/HDL Ratio: 2.9 Ratio (ref <=5.0)
VLDL: 12 mg/dL (ref <30)

## 2016-07-23 LAB — HEPATIC FUNCTION PANEL
ALBUMIN: 3.9 g/dL (ref 3.6–5.1)
ALT: 14 U/L (ref 9–46)
AST: 19 U/L (ref 10–35)
Alkaline Phosphatase: 56 U/L (ref 40–115)
BILIRUBIN DIRECT: 0.1 mg/dL (ref <=0.2)
Indirect Bilirubin: 0.5 mg/dL (ref 0.2–1.2)
TOTAL PROTEIN: 6.4 g/dL (ref 6.1–8.1)
Total Bilirubin: 0.6 mg/dL (ref 0.2–1.2)

## 2016-07-23 LAB — TSH: TSH: 5.86 m[IU]/L — AB (ref 0.40–4.50)

## 2016-07-23 NOTE — Patient Instructions (Signed)
Please ask about cutting the metoprolol medication dosage down when you go to Dr. Thurman Coyer office today.

## 2016-07-23 NOTE — Progress Notes (Signed)
Assessment and Plan:  Hypertension:  -recommended cutting metoprolol dose in half and to speak with [name] about this -Continue medication,  -monitor blood pressure at home.  -Continue DASH diet.   -Reminder to go to the ER if any CP, SOB, nausea, dizziness, severe HA, changes vision/speech, left arm numbness and tingling, and jaw pain.  Atrial fibrillation -currently being rate controlled with amiodrarone -still taking coumadin  -being monitored by [name]  Athersclerosis -on couamdin -blood pressure well controlled -not on cholesterol medications currently due to age  Cholesterol: -Continue diet and exercise.  -Check cholesterol.   Pre-diabetes: -Continue diet and exercise.  -Check A1C  Vitamin D Def: -check level -continue medications.   Dementia -cont namenda -wife is still taking care of him adequately  Continue diet and meds as discussed. Further disposition pending results of labs.  HPI 82 y.o. male  presents for 3 month follow up with hypertension, hyperlipidemia, prediabetes and vitamin D.   His blood pressure has been controlled at home, today their BP is BP: (!) 104/58.   He does not workout. He denies chest pain, shortness of breath, dizziness.  He reports that he has not seen the cardiologists recently.  He thinks that he is due for a visit.  He is not checking BPs regularly at home.  He reports that he is trying to drink plenty of water.     He is not on cholesterol medication and denies myalgias. His cholesterol is at goal. The cholesterol last visit was:   Lab Results  Component Value Date   CHOL 200 12/23/2015   HDL 57 12/23/2015   LDLCALC 109 12/23/2015   TRIG 172 (H) 12/23/2015   CHOLHDL 3.5 12/23/2015     He has been working on diet and exercise for prediabetes, and denies foot ulcerations, hyperglycemia, hypoglycemia , increased appetite, nausea, paresthesia of the feet, polydipsia, polyuria, visual disturbances, vomiting and weight loss.  Last A1C in the office was:  Lab Results  Component Value Date   HGBA1C 6.0 (H) 12/23/2015    Patient is on Vitamin D supplement.  Lab Results  Component Value Date   VD25OH 37 06/26/2015      He still is coughing quite frequently.  He does not feel like he is choking on his food.  He reports that choking on his food is rarity.    He is alone today. His wife did not want to come back with him today.  He reports that she is making all his medications boxes.     Current Medications:  Current Outpatient Prescriptions on File Prior to Visit  Medication Sig Dispense Refill  . amiodarone (PACERONE) 200 MG tablet Take 100 mg by mouth 2 (two) times daily. Taking half tablet daily    . cholecalciferol (VITAMIN D) 1000 UNITS tablet Take 2,000 Units by mouth daily.     . hydrocortisone 2.5 % cream Apply 1 application topically daily as needed (inflammation on face). To face    . memantine (NAMENDA XR) 28 MG CP24 24 hr capsule Take 28 mg by mouth daily.    Marland Kitchen METOPROLOL SUCCINATE ER PO Take 25 mg by mouth daily.    Marland Kitchen SYNTHROID 50 MCG tablet TAKE 1 TABLET DAILY BEFORE BREAKFAST 90 tablet 1  . warfarin (COUMADIN) 4 MG tablet Take 4 mg by mouth daily. Except taking 2 mg on friday     No current facility-administered medications on file prior to visit.     Medical History:  Past  Medical History:  Diagnosis Date  . Atherosclerosis of aorta ([place]) 2010   noted on lumbar CT in 2010  . Atrial fibrillation ([place])   . Basal cell carcinoma, face   . Chronic sinusitis   . COPD (chronic obstructive pulmonary disease) ([place])    On CXR  . DJD (degenerative joint disease)   . History of right MCA stroke ~ 2002; 2006  . Hyperlipidemia   . Hypertension   . Hypothyroidism   . Hypothyroidism   . OSA on CPAP   . Osteopenia   . Pacemaker medtronic   . Pneumonia 01/28/2016  . Prediabetes   . RLS (restless legs syndrome)   . Sepsis ([place]) 04/14/2016  . Sinoatrial node dysfunction (HCC)   . Vitamin D  deficiency     Allergies:  Allergies  Allergen Reactions  . Bee Venom Anaphylaxis     Review of Systems:  Review of Systems  Unable to perform ROS: Dementia    Family history- Review and unchanged  Social history- Review and unchanged  Physical Exam: BP (!) 104/58   Pulse 64   Temp 97.8 F (36.6 C) (Temporal)   Resp 14   Ht 5\' 8"  (1.727 m)   Wt 170 lb (77.1 kg)   BMI 25.85 kg/m  Wt Readings from Last 3 Encounters:  07/23/16 170 lb (77.1 kg)  05/29/16 170 lb (77.1 kg)  04/29/16 173 lb 3.2 oz (78.6 kg)    General Appearance: Well nourished well developed, in no apparent distress. Eyes: PERRLA, EOMs, conjunctiva no swelling or erythema ENT/Mouth: Ear canals normal without obstruction, swelling, erythma, discharge.  TMs normal bilaterally.  Oropharynx moist, clear, without exudate, or postoropharyngeal swelling. Neck: Supple, thyroid normal,no cervical adenopathy  Respiratory: Respiratory effort normal, Breath sounds clear A&P without rhonchi, wheeze, or rale.  No retractions, no accessory usage. Cardio: RRR with no RGs 1+ peripheral pulses without edema.  Abdomen: Soft, + BS,  Non tender, no guarding, rebound, hernias, masses. Musculoskeletal: Full ROM, 5/5 strength, Normal gait Skin: Warm, dry without rashes, lesions, ecchymosis.  Neuro: Awake and oriented X 3, Cranial nerves intact. Normal muscle tone, no cerebellar symptoms. Psych: Poor short term memory and recall, poor insight    Starlyn Skeans, PA-C 10:01 AM [facility]

## 2016-07-24 LAB — HEMOGLOBIN A1C
Hgb A1c MFr Bld: 5.5 % (ref <5.7)
MEAN PLASMA GLUCOSE: 111 mg/dL

## 2016-07-24 LAB — PROTIME-INR
INR: 1.2 — AB
PROTHROMBIN TIME: 12.3 s — AB (ref 9.0–11.5)

## 2016-07-30 ENCOUNTER — Other Ambulatory Visit: Payer: Self-pay | Admitting: Neurology

## 2016-07-30 DIAGNOSIS — Z950 Presence of cardiac pacemaker: Secondary | ICD-10-CM | POA: Diagnosis not present

## 2016-07-30 DIAGNOSIS — E039 Hypothyroidism, unspecified: Secondary | ICD-10-CM | POA: Diagnosis not present

## 2016-07-30 DIAGNOSIS — I951 Orthostatic hypotension: Secondary | ICD-10-CM | POA: Diagnosis not present

## 2016-07-30 DIAGNOSIS — G4733 Obstructive sleep apnea (adult) (pediatric): Secondary | ICD-10-CM | POA: Diagnosis not present

## 2016-07-30 DIAGNOSIS — Z8673 Personal history of transient ischemic attack (TIA), and cerebral infarction without residual deficits: Secondary | ICD-10-CM | POA: Diagnosis not present

## 2016-07-30 DIAGNOSIS — I480 Paroxysmal atrial fibrillation: Secondary | ICD-10-CM | POA: Diagnosis not present

## 2016-07-30 DIAGNOSIS — Z7901 Long term (current) use of anticoagulants: Secondary | ICD-10-CM | POA: Diagnosis not present

## 2016-07-30 DIAGNOSIS — E785 Hyperlipidemia, unspecified: Secondary | ICD-10-CM | POA: Diagnosis not present

## 2016-08-06 DIAGNOSIS — G4733 Obstructive sleep apnea (adult) (pediatric): Secondary | ICD-10-CM | POA: Diagnosis not present

## 2016-08-06 DIAGNOSIS — E039 Hypothyroidism, unspecified: Secondary | ICD-10-CM | POA: Diagnosis not present

## 2016-08-06 DIAGNOSIS — Z7901 Long term (current) use of anticoagulants: Secondary | ICD-10-CM | POA: Diagnosis not present

## 2016-08-06 DIAGNOSIS — I951 Orthostatic hypotension: Secondary | ICD-10-CM | POA: Diagnosis not present

## 2016-08-06 DIAGNOSIS — E785 Hyperlipidemia, unspecified: Secondary | ICD-10-CM | POA: Diagnosis not present

## 2016-08-06 DIAGNOSIS — Z950 Presence of cardiac pacemaker: Secondary | ICD-10-CM | POA: Diagnosis not present

## 2016-08-06 DIAGNOSIS — I480 Paroxysmal atrial fibrillation: Secondary | ICD-10-CM | POA: Diagnosis not present

## 2016-08-06 DIAGNOSIS — Z8673 Personal history of transient ischemic attack (TIA), and cerebral infarction without residual deficits: Secondary | ICD-10-CM | POA: Diagnosis not present

## 2016-08-17 ENCOUNTER — Other Ambulatory Visit: Payer: Self-pay | Admitting: *Deleted

## 2016-08-17 NOTE — Patient Outreach (Signed)
[place] [facility]) Care Management  08/17/2016   Paul Davies [facility] Feb 22, 1930 [id number]  [place] telephone call to patient.  Hipaa compliance verified. Per patient He weighs 170 pounds. He isdoing good. He is suppose to be on a low sodium diet. Patient has no swelling in lower extremities. No cough or shortness of breath. Patient is still working on the farm Data processing manager. Patient wife is the main caregiver. She prepares his medication and meals.  Patient and wife have agreed to follow up telephone assessments.    Objective:   Current Medications:  Current Outpatient Prescriptions  Medication Sig Dispense Refill  . amiodarone (PACERONE) 200 MG tablet Take 100 mg by mouth 2 (two) times daily. Taking half tablet daily    . cholecalciferol (VITAMIN D) 1000 UNITS tablet Take 2,000 Units by mouth daily.     . hydrocortisone 2.5 % cream Apply 1 application topically daily as needed (inflammation on face). To face    . METOPROLOL SUCCINATE ER PO Take 25 mg by mouth daily.    Marland Kitchen NAMENDA XR 28 MG CP24 24 hr capsule TAKE 1 CAPSULE DAILY 90 capsule 1  . SYNTHROID 50 MCG tablet TAKE 1 TABLET DAILY BEFORE BREAKFAST 90 tablet 1  . warfarin (COUMADIN) 4 MG tablet Take 4 mg by mouth daily. Except taking 2 mg on friday    . memantine (NAMENDA XR) 28 MG CP24 24 hr capsule Take 28 mg by mouth daily.     No current facility-administered medications for this visit.     Functional Status:  In your present state of health, do you have any difficulty performing the following activities: 08/17/2016 07/03/2016  Hearing? [place]? - -  Difficulty concentrating or making decisions? Y -  Walking or climbing stairs? Y -  Dressing or bathing? N -  Doing errands, shopping? Y -  Conservation officer, nature and eating ? Y N  Using the Toilet? N N  In the past six months, have you accidently leaked urine? Y Y  Do you have problems with loss of bowel control? N N  Managing your  Medications? Y Y  Managing your Finances? N N  Housekeeping or managing your Housekeeping? Tempie Donning  Some recent data might be hidden    Fall/Depression Screening: PHQ 2/9 Scores 08/17/2016 07/03/2016 05/01/2016 09/26/2015 12/19/2014 09/18/2014 12/11/2013  PHQ - 2 Score 1 1 0 0 0 0 0   THN CM Care Plan Problem One   Flowsheet Row Most Recent Value  Care Plan Problem One  Knowledge Deficit in Self management of Congestive Heart Failure  Role Documenting the Problem One  [place] for Problem One  Active  THN Long Term Goal (31-90 days)  Patient will not have any admissions for congestive heart failure within the next 90 days  THN Long Term Goal Start Date  08/17/16  Interventions for Problem One Long Term Goal  RN reminded patient to keep appointments with PCP and cardiologist. RN reminbded patient the importance of taking medications as per order. RN will follow up monthly  THN CM Short Term Goal #1 (0-30 days)  Patient and wife will be report doing daily weight and recording within the next 30 days  THN CM Short Term Goal #1 Start Date  08/17/16  Interventions for Short Term Goal #1  RN send educational material on Weigh myself daily. RN sent a calendar book for documentation  THN CM Short Term Goal #2 (0-30 days)  Patient and wife will be able to describe foods low in sodium within the next 30 days.  THN CM Short Term Goal #2 Start Date  08/17/16  Interventions for Short Term Goal #2  RN sent a picture chart of foods low in sodium. RN send education material on dash diet with samples. RN sent addition educational material on low sodium diet  THN CM Short Term Goal #3 (0-30 days)  Patient and wife will be able to verbalize CHF zones and action plan  THN CM Short Term Goal #3 Start Date  08/18/16  Interventions for Short Tern Goal #3  RN sent educational sheet on what zone are you in today. RN will follow up with discussion and teach back      Assessment:  Patient needs education on  low sodium diet Patient will benefit from [place] telephonic outreach for education and support for congestive heart failure self management.  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

## 2016-08-18 ENCOUNTER — Encounter: Payer: Self-pay | Admitting: *Deleted

## 2016-08-20 DIAGNOSIS — Z8673 Personal history of transient ischemic attack (TIA), and cerebral infarction without residual deficits: Secondary | ICD-10-CM | POA: Diagnosis not present

## 2016-08-20 DIAGNOSIS — G4733 Obstructive sleep apnea (adult) (pediatric): Secondary | ICD-10-CM | POA: Diagnosis not present

## 2016-08-20 DIAGNOSIS — I480 Paroxysmal atrial fibrillation: Secondary | ICD-10-CM | POA: Diagnosis not present

## 2016-08-20 DIAGNOSIS — E039 Hypothyroidism, unspecified: Secondary | ICD-10-CM | POA: Diagnosis not present

## 2016-08-20 DIAGNOSIS — Z950 Presence of cardiac pacemaker: Secondary | ICD-10-CM | POA: Diagnosis not present

## 2016-08-20 DIAGNOSIS — E785 Hyperlipidemia, unspecified: Secondary | ICD-10-CM | POA: Diagnosis not present

## 2016-08-20 DIAGNOSIS — Z7901 Long term (current) use of anticoagulants: Secondary | ICD-10-CM | POA: Diagnosis not present

## 2016-08-20 DIAGNOSIS — I951 Orthostatic hypotension: Secondary | ICD-10-CM | POA: Diagnosis not present

## 2016-09-18 DIAGNOSIS — Z8673 Personal history of transient ischemic attack (TIA), and cerebral infarction without residual deficits: Secondary | ICD-10-CM | POA: Diagnosis not present

## 2016-09-18 DIAGNOSIS — I480 Paroxysmal atrial fibrillation: Secondary | ICD-10-CM | POA: Diagnosis not present

## 2016-09-18 DIAGNOSIS — I951 Orthostatic hypotension: Secondary | ICD-10-CM | POA: Diagnosis not present

## 2016-09-18 DIAGNOSIS — E785 Hyperlipidemia, unspecified: Secondary | ICD-10-CM | POA: Diagnosis not present

## 2016-09-18 DIAGNOSIS — G4733 Obstructive sleep apnea (adult) (pediatric): Secondary | ICD-10-CM | POA: Diagnosis not present

## 2016-09-18 DIAGNOSIS — Z7901 Long term (current) use of anticoagulants: Secondary | ICD-10-CM | POA: Diagnosis not present

## 2016-09-18 DIAGNOSIS — Z950 Presence of cardiac pacemaker: Secondary | ICD-10-CM | POA: Diagnosis not present

## 2016-09-18 DIAGNOSIS — E039 Hypothyroidism, unspecified: Secondary | ICD-10-CM | POA: Diagnosis not present

## 2016-09-21 ENCOUNTER — Other Ambulatory Visit: Payer: Self-pay | Admitting: *Deleted

## 2016-09-21 NOTE — Patient Outreach (Signed)
[name]  09/21/2016  Paul Davies [facility] 06-09-1930 [id number]    [place] attempted  #1Follow up outreach call to patient.  Patient was unavailable. HIPPA compliance voicemail message was left with return callback number.   Plan: RN will call patient again within 14 days.    [name] Care Management [phone]

## 2016-10-01 ENCOUNTER — Encounter: Payer: Self-pay | Admitting: *Deleted

## 2016-10-01 ENCOUNTER — Other Ambulatory Visit: Payer: Self-pay | Admitting: *Deleted

## 2016-10-01 NOTE — Patient Outreach (Signed)
[place] [facility]) Care Management  10/01/2016   Paul Davies [facility] 15-Jul-1930 [id number]  Subjective: RN Health Coach telephone call to patient.  Hipaa compliance verified. Per patient and wife. Patient has been out working on his tractor. Patient is not having any swelling in extremities. Patient not having any shortness of breath. Per patient wife he is wrapping up real good to go outside. Patient is weighing daily and recording. Patient wife takes his blood pressure daily. Patient has taken flu shot. Patient has met his goal. RN will follow up with closure letters to patient and physician.   Objective:   Current Medications:  Current Outpatient Prescriptions  Medication Sig Dispense Refill  . amiodarone (PACERONE) 200 MG tablet Take 100 mg by mouth 2 (two) times daily. Taking half tablet daily    . apixaban (ELIQUIS) 5 MG TABS tablet Take 5 mg by mouth 2 (two) times daily.    . cholecalciferol (VITAMIN D) 1000 UNITS tablet Take 2,000 Units by mouth daily.     . hydrocortisone 2.5 % cream Apply 1 application topically daily as needed (inflammation on face). To face    . memantine (NAMENDA XR) 28 MG CP24 24 hr capsule Take 28 mg by mouth daily.    Marland Kitchen METOPROLOL SUCCINATE ER PO Take 25 mg by mouth daily.    Marland Kitchen NAMENDA XR 28 MG CP24 24 hr capsule TAKE 1 CAPSULE DAILY 90 capsule 1  . SYNTHROID 50 MCG tablet TAKE 1 TABLET DAILY BEFORE BREAKFAST 90 tablet 1  . warfarin (COUMADIN) 4 MG tablet Take 4 mg by mouth daily. Except taking 2 mg on friday     No current facility-administered medications for this visit.     Functional Status:  In your present state of health, do you have any difficulty performing the following activities: 10/01/2016 08/17/2016  Hearing? Tempie Donning  [place]? - -  Difficulty concentrating or making decisions? Tempie Donning  Walking or climbing stairs? Y Y  Dressing or bathing? N N  Doing errands, shopping? Tempie Donning  Preparing Food and eating ? - Y  Using the Toilet? - N  In the  past six months, have you accidently leaked urine? - Y  Do you have problems with loss of bowel control? - N  Managing your Medications? - Y  Managing your Finances? - N  Housekeeping or managing your Housekeeping? - Y  Some recent data might be hidden    Fall/Depression Screening: PHQ 2/9 Scores 10/01/2016 08/17/2016 07/03/2016 05/01/2016 09/26/2015 12/19/2014 09/18/2014  PHQ - 2 Score '1 1 1 ' 0 0 0 0   THN CM Care Plan Problem One   Flowsheet Row Most Recent Value  Care Plan Problem One  Knowledge Deficit in Self management of Congestive Heart Failure  Role Documenting the Problem One  [place] for Problem One  Active  THN Long Term Goal (31-90 days)  Patient will not have any admissions for congestive heart failure within the next 90 days  THN Long Term Goal Met Date  10/01/16  Interventions for Problem One Long Term Goal  RN reminded patient to keep appointments with PCP and cardiologist. RN reminbded patient the importance of taking medications as per order. RN will follow up monthly  THN CM Short Term Goal #1 (0-30 days)  Patient and wife will be report doing daily weight and recording within the next 30 days  THN CM Short Term Goal #1 Met Date  10/01/16  Interventions for Short Term Goal #  1  RN send Neurosurgeon on Weigh myself daily. RN sent a calendar book for documentation  THN CM Short Term Goal #2 (0-30 days)  Patient and wife will be able to describe foods low in sodium within the next 30 days.  THN CM Short Term Goal #2 Met Date  10/01/16  Interventions for Short Term Goal #2  RN sent a picture chart of foods low in sodium. RN send education material on dash diet with samples. RN sent addition educational material on low sodium diet  THN CM Short Term Goal #3 (0-30 days)  Patient and wife will be able to verbalize CHF zones and action plan  THN CM Short Term Goal #3 Met Date  10/01/16  Interventions for Short Tern Goal #3  RN sent educational sheet on what zone  are you in today. RN will follow up with discussion and teach back       Assessment:  Patient is in the green zone Patient is weighing daily Patient understand what zones is Patient has met his goals   Plan:  RN will follow up closure letter to patient and physician RN will send patient EMMI educational material on cold, flu and pneumonia [place] Management [phone]

## 2016-10-11 DIAGNOSIS — R404 Transient alteration of awareness: Secondary | ICD-10-CM | POA: Diagnosis not present

## 2016-10-11 DIAGNOSIS — R531 Weakness: Secondary | ICD-10-CM | POA: Diagnosis not present

## 2016-10-12 ENCOUNTER — Encounter (HOSPITAL_COMMUNITY): Payer: Self-pay | Admitting: Emergency Medicine

## 2016-10-12 ENCOUNTER — Emergency Department (HOSPITAL_COMMUNITY): Payer: Medicare Other

## 2016-10-12 ENCOUNTER — Emergency Department (HOSPITAL_COMMUNITY)
Admission: EM | Admit: 2016-10-12 | Discharge: 2016-10-12 | Disposition: A | Discharge status: Home / Self Care | Payer: Medicare Other | Attending: Emergency Medicine | Admitting: Emergency Medicine

## 2016-10-12 DIAGNOSIS — E039 Hypothyroidism, unspecified: Secondary | ICD-10-CM | POA: Insufficient documentation

## 2016-10-12 DIAGNOSIS — Z7722 Contact with and (suspected) exposure to environmental tobacco smoke (acute) (chronic): Secondary | ICD-10-CM | POA: Insufficient documentation

## 2016-10-12 DIAGNOSIS — R400 Somnolence: Secondary | ICD-10-CM | POA: Insufficient documentation

## 2016-10-12 DIAGNOSIS — Z8673 Personal history of transient ischemic attack (TIA), and cerebral infarction without residual deficits: Secondary | ICD-10-CM | POA: Insufficient documentation

## 2016-10-12 DIAGNOSIS — Z0000 Encounter for general adult medical examination without abnormal findings: Secondary | ICD-10-CM

## 2016-10-12 DIAGNOSIS — I5042 Chronic combined systolic (congestive) and diastolic (congestive) heart failure: Secondary | ICD-10-CM | POA: Diagnosis not present

## 2016-10-12 DIAGNOSIS — Z7901 Long term (current) use of anticoagulants: Secondary | ICD-10-CM | POA: Diagnosis not present

## 2016-10-12 DIAGNOSIS — R531 Weakness: Secondary | ICD-10-CM | POA: Diagnosis present

## 2016-10-12 DIAGNOSIS — Z79899 Other long term (current) drug therapy: Secondary | ICD-10-CM | POA: Insufficient documentation

## 2016-10-12 DIAGNOSIS — I110 Hypertensive heart disease with heart failure: Secondary | ICD-10-CM | POA: Diagnosis not present

## 2016-10-12 DIAGNOSIS — J449 Chronic obstructive pulmonary disease, unspecified: Secondary | ICD-10-CM | POA: Diagnosis not present

## 2016-10-12 DIAGNOSIS — R05 Cough: Secondary | ICD-10-CM | POA: Diagnosis not present

## 2016-10-12 LAB — URINE MICROSCOPIC-ADD ON

## 2016-10-12 LAB — COMPREHENSIVE METABOLIC PANEL
ALBUMIN: 3.3 g/dL — AB (ref 3.5–5.0)
ALK PHOS: 70 U/L (ref 38–126)
ALT: 12 U/L — AB (ref 17–63)
AST: 18 U/L (ref 15–41)
Anion gap: 9 (ref 5–15)
BILIRUBIN TOTAL: 0.4 mg/dL (ref 0.3–1.2)
BUN: 20 mg/dL (ref 6–20)
CALCIUM: 8.9 mg/dL (ref 8.9–10.3)
CO2: 22 mmol/L (ref 22–32)
CREATININE: 1.11 mg/dL (ref 0.61–1.24)
Chloride: 108 mmol/L (ref 101–111)
GFR calc Af Amer: >60 mL/min (ref >60)
GFR calc non Af Amer: 59 mL/min — ABNORMAL LOW (ref >60)
GLUCOSE: 120 mg/dL — AB (ref 65–99)
Potassium: 3.6 mmol/L (ref 3.5–5.1)
Sodium: 139 mmol/L (ref 135–145)
TOTAL PROTEIN: 5.8 g/dL — AB (ref 6.5–8.1)

## 2016-10-12 LAB — URINALYSIS, ROUTINE W REFLEX MICROSCOPIC
Bilirubin Urine: NEGATIVE
GLUCOSE, UA: NEGATIVE mg/dL
KETONES UR: NEGATIVE mg/dL
Leukocytes, UA: NEGATIVE
Nitrite: NEGATIVE
PROTEIN: NEGATIVE mg/dL
Specific Gravity, Urine: 1.034 — ABNORMAL HIGH (ref 1.005–1.030)
pH: 5.5 (ref 5.0–8.0)

## 2016-10-12 LAB — CBC WITH DIFFERENTIAL/PLATELET
BASOS ABS: 0 10*3/uL (ref 0.0–0.1)
BASOS PCT: 0 %
Eosinophils Absolute: 0.3 10*3/uL (ref 0.0–0.7)
Eosinophils Relative: 4 %
HEMATOCRIT: 37.1 % — AB (ref 39.0–52.0)
HEMOGLOBIN: 12.3 g/dL — AB (ref 13.0–17.0)
LYMPHS PCT: 25 %
Lymphs Abs: 1.7 10*3/uL (ref 0.7–4.0)
MCH: 30.7 pg (ref 26.0–34.0)
MCHC: 33.2 g/dL (ref 30.0–36.0)
MCV: 92.5 fL (ref 78.0–100.0)
Monocytes Absolute: 0.7 10*3/uL (ref 0.1–1.0)
Monocytes Relative: 10 %
NEUTROS ABS: 4.1 10*3/uL (ref 1.7–7.7)
NEUTROS PCT: 61 %
Platelets: 144 10*3/uL — ABNORMAL LOW (ref 150–400)
RBC: 4.01 MIL/uL — ABNORMAL LOW (ref 4.22–5.81)
RDW: 13.3 % (ref 11.5–15.5)
WBC: 6.7 10*3/uL (ref 4.0–10.5)

## 2016-10-12 LAB — LACTIC ACID, PLASMA: Lactic Acid, Venous: 1.9 mmol/L (ref 0.5–1.9)

## 2016-10-12 LAB — CBG MONITORING, ED: Glucose-Capillary: 114 mg/dL — ABNORMAL HIGH (ref 65–99)

## 2016-10-12 MED ORDER — SODIUM CHLORIDE 0.9 % IV SOLN
1000.0000 mL | Freq: Once | INTRAVENOUS | Status: AC
  Administered 2016-10-12: 1000 mL via INTRAVENOUS

## 2016-10-12 NOTE — ED Provider Notes (Signed)
[facility] DEPT Provider Note   CSN: [id number] Arrival date & time: 10/12/16  0053     History   Chief Complaint Chief Complaint  Patient presents with  . Weakness    HPI Paul Davies is a 82 y.o. male.  Patient with a history of HTN, HLD, COPD, OSA, medronic pacemaker, atrial fibrillation presents to the emergency department after his wife called EMS for lethargy and difficult to awaken. The patient has been well lately and had eaten a large meal before falling asleep in his chair. On trying to waken him to go to bed, the wife became concerned that it was hard to arouse him and called 911. The patient denies pain, SOB, nausea or feeling ill in any way.    The history is provided by the patient. No language interpreter was used.  Weakness     Past Medical History:  Diagnosis Date  . Atherosclerosis of aorta ([place]) 2010   noted on lumbar CT in 2010  . Atrial fibrillation ([place])   . Basal cell carcinoma, face   . Chronic sinusitis   . COPD (chronic obstructive pulmonary disease) ([place])    On CXR  . DJD (degenerative joint disease)   . History of right MCA stroke ~ 2002; 2006  . Hyperlipidemia   . Hypertension   . Hypothyroidism   . Hypothyroidism   . OSA on CPAP   . Osteopenia   . Pacemaker medtronic   . Pneumonia 01/28/2016  . Prediabetes   . RLS (restless legs syndrome)   . Sepsis ([place]) 04/14/2016  . Sinoatrial node dysfunction (HCC)   . Vitamin D deficiency     Patient Active Problem List   Diagnosis Date Noted  . Medication management 04/22/2016  . Prediabetes 04/22/2016  . HCAP (healthcare-associated pneumonia) 04/14/2016  . Hypothyroidism 04/14/2016  . Chronic combined systolic and diastolic congestive heart failure ([place]) 01/30/2016  . Atherosclerosis of aorta ([place])   . COPD (chronic obstructive pulmonary disease) ([place]) 12/19/2014  . RLS (restless legs syndrome) 12/19/2014  . Cardiac pacemaker in situ   . Essential hypertension 03/19/2014  .  Current use of long term anticoagulation   . Sleep apnea in adult   . Alzheimer's disease 03/06/2014  . Hyperlipidemia   . Vitamin D deficiency   . History of stroke   . Paroxysmal atrial fibrillation (HCC)   . BPH 12/20/2008    Past Surgical History:  Procedure Laterality Date  . BASAL CELL CARCINOMA EXCISION     "face"  . CARDIAC CATHETERIZATION  02/2004   normal coronary arteries  . INSERT / REPLACE / REMOVE PACEMAKER  04/2005  . PACEMAKER GENERATOR CHANGE N/A 05/04/2014   Procedure: PACEMAKER GENERATOR CHANGE;  Surgeon: [name], MD;  Location[facility] C[facility] LAB;  Service: Cardiovascular;  Laterality: N/A;  . SHOULDER OPEN ROTATOR CUFF REPAIR Right 02/2008  . TRANSURETHRAL RESECTION OF PROSTATE         Home Medications    Prior to Admission medications   Medication Sig Start Date End Date Taking? Authorizing Provider  amiodarone (PACERONE) 200 MG tablet Take 100 mg by mouth 2 (two) times daily. Taking half tablet daily    Historical Provider, MD  apixaban (ELIQUIS) 5 MG TABS tablet Take 5 mg by mouth 2 (two) times daily.    Historical Provider, MD  cholecalciferol (VITAMIN D) 1000 UNITS tablet Take 2,000 Units by mouth daily.     Historical Provider, MD  hydrocortisone 2.5 % cream Apply 1 application topically  daily as needed (inflammation on face). To face    Historical Provider, MD  memantine (NAMENDA XR) 28 MG CP24 24 hr capsule Take 28 mg by mouth daily.    Historical Provider, MD  METOPROLOL SUCCINATE ER PO Take 25 mg by mouth daily.    Historical Provider, MD  NAMENDA XR 28 MG CP24 24 hr capsule TAKE 1 CAPSULE DAILY 07/31/16   Garvin Fila, MD  SYNTHROID 50 MCG tablet TAKE 1 TABLET DAILY BEFORE BREAKFAST 07/13/16   Unk Pinto, MD  warfarin (COUMADIN) 4 MG tablet Take 4 mg by mouth daily. Except taking 2 mg on friday    Historical Provider, MD    Family History Family History  Problem Relation Age of Onset  . Heart attack Father   . Stroke Sister   . Dementia  Sister   . Heart attack Brother 55    Social History Social History  Substance Use Topics  . Smoking status: Passive Smoke Exposure - Never Smoker  . Smokeless tobacco: Never Used  . Alcohol use No     Allergies   Bee venom   Review of Systems Review of Systems  Constitutional: Negative for appetite change, chills and fever.  HENT: Negative.   Respiratory: Negative.   Cardiovascular: Negative.   Gastrointestinal: Negative.   Genitourinary: Negative.   Musculoskeletal: Negative.   Skin: Negative.   Neurological:       See HPI.     Physical Exam Updated Vital Signs BP 107/68 (BP Location: Left Arm)   Pulse 61   Temp 98.3 F (36.8 C) (Oral)   Resp 15   SpO2 93%   Physical Exam  Constitutional: He is oriented to person, place, and time. He appears well-developed and well-nourished. No distress.  HENT:  Head: Normocephalic.  Eyes: Conjunctivae are normal.  Neck: Normal range of motion. Neck supple.  Cardiovascular: Normal rate and regular rhythm.   No murmur heard. Pulmonary/Chest: Effort normal and breath sounds normal.  Abdominal: Soft. Bowel sounds are normal. There is no tenderness. There is no rebound and no guarding.  Musculoskeletal: Normal range of motion.  Neurological: He is alert and oriented to person, place, and time. Coordination normal.  CN's 3-12 intact. Speech clear, focused. Ambulatory without ataxia. He is alert without somnolence.   Skin: Skin is warm and dry. No rash noted.  Psychiatric: He has a normal mood and affect.     ED Treatments / Results  Labs (all labs ordered are listed, but only abnormal results are displayed) Labs Reviewed  CBG MONITORING, ED - Abnormal; Notable for the following:       Result Value   Glucose-Capillary 114 (*)    All other components within normal limits  URINE CULTURE  CBC WITH DIFFERENTIAL/PLATELET  COMPREHENSIVE METABOLIC PANEL  URINALYSIS, ROUTINE W REFLEX MICROSCOPIC (NOT AT [facility])  LACTIC ACID,  PLASMA   Results for orders placed or performed during the hospital encounter of 10/12/16  CBC with Differential  Result Value Ref Range   WBC 6.7 4.0 - 10.5 K/uL   RBC 4.01 (L) 4.22 - 5.81 MIL/uL   Hemoglobin 12.3 (L) 13.0 - 17.0 g/dL   HCT 37.1 (L) 39.0 - 52.0 %   MCV 92.5 78.0 - 100.0 fL   MCH 30.7 26.0 - 34.0 pg   MCHC 33.2 30.0 - 36.0 g/dL   RDW 13.3 11.5 - 15.5 %   Platelets 144 (L) 150 - 400 K/uL   Neutrophils Relative % 61 %  Neutro Abs 4.1 1.7 - 7.7 K/uL   Lymphocytes Relative 25 %   Lymphs Abs 1.7 0.7 - 4.0 K/uL   Monocytes Relative 10 %   Monocytes Absolute 0.7 0.1 - 1.0 K/uL   Eosinophils Relative 4 %   Eosinophils Absolute 0.3 0.0 - 0.7 K/uL   Basophils Relative 0 %   Basophils Absolute 0.0 0.0 - 0.1 K/uL  Comprehensive metabolic panel  Result Value Ref Range   Sodium 139 135 - 145 mmol/L   Potassium 3.6 3.5 - 5.1 mmol/L   Chloride 108 101 - 111 mmol/L   CO2 22 22 - 32 mmol/L   Glucose, Bld 120 (H) 65 - 99 mg/dL   BUN 20 6 - 20 mg/dL   Creatinine, Ser 1.11 0.61 - 1.24 mg/dL   Calcium 8.9 8.9 - 10.3 mg/dL   Total Protein 5.8 (L) 6.5 - 8.1 g/dL   Albumin 3.3 (L) 3.5 - 5.0 g/dL   AST 18 15 - 41 U/L   ALT 12 (L) 17 - 63 U/L   Alkaline Phosphatase 70 38 - 126 U/L   Total Bilirubin 0.4 0.3 - 1.2 mg/dL   GFR calc non Af Amer 59 (L) >60 mL/min   GFR calc Af Amer >60 >60 mL/min   Anion gap 9 5 - 15  Urinalysis, Routine w reflex microscopic  Result Value Ref Range   Color, Urine YELLOW YELLOW   APPearance CLEAR CLEAR   Specific Gravity, Urine 1.034 (H) 1.005 - 1.030   pH 5.5 5.0 - 8.0   Glucose, UA NEGATIVE NEGATIVE mg/dL   Hgb urine dipstick TRACE (A) NEGATIVE   Bilirubin Urine NEGATIVE NEGATIVE   Ketones, ur NEGATIVE NEGATIVE mg/dL   Protein, ur NEGATIVE NEGATIVE mg/dL   Nitrite NEGATIVE NEGATIVE   Leukocytes, UA NEGATIVE NEGATIVE  Lactic acid, plasma  Result Value Ref Range   Lactic Acid, Venous 1.9 0.5 - 1.9 mmol/L  Urine microscopic-add on  Result  Value Ref Range   Squamous Epithelial / LPF 0-5 (A) NONE SEEN   WBC, UA 0-5 0 - 5 WBC/hpf   RBC / HPF 0-5 0 - 5 RBC/hpf   Bacteria, UA FEW (A) NONE SEEN   Crystals CA OXALATE CRYSTALS (A) NEGATIVE   Urine-Other MUCOUS PRESENT   CBG monitoring, ED  Result Value Ref Range   Glucose-Capillary 114 (H) 65 - 99 mg/dL    EKG  EKG Interpretation None       Radiology No results found. Dg Chest 2 View  Result Date: 10/12/2016 CLINICAL DATA:  Lethargy tonight.  Cough EXAM: CHEST  2 VIEW COMPARISON:  05/06/2016 FINDINGS: Cardiac pacemaker. Shallow inspiration with linear atelectasis in the lung bases. Can't exclude basilar infiltrates. No blunting of costophrenic angles. No pneumothorax. Calcified aorta. Degenerative changes in the spine. IMPRESSION: Shallow inspiration with linear atelectasis or infiltration in the lung bases. Electronically Signed   By: Lucienne Capers M.D.   On: 10/12/2016 01:39    Procedures Procedures (including critical care time)  Medications Ordered in ED Medications  0.9 %  sodium chloride infusion (1,000 mLs Intravenous New Bag/Given 10/12/16 0109)     Initial Impression / Assessment and Plan / ED Course  I have reviewed the triage vital signs and the nursing notes.  Pertinent labs & imaging results that were available during my care of the patient were reviewed by me and considered in my medical decision making (see chart for details).  Clinical Course    Patient here by EMS with  concern by his wife of being difficult to waken. Here, he is alert, contributory to history and in NAD. VSS. Labs are essentially unremarkable. No evidence of infection, sepsis, CVA, acute cardiac event.   On multiple re-evaluations the patient remains easy to wake. He is ambulated without ataxic gait. He continues to be asymptomatic. Wife at bedside. He is examined by Dr. Dina Rich and found appropriate for discharge home with close PCP follow up.  Final Clinical Impressions(s) /  ED Diagnoses   Final diagnoses:  None   1. Somnolent 2. Normal exam  New Prescriptions New Prescriptions   No medications on file     Charlann Lange, Hershal Coria 10/12/16 [place], MD 10/13/16 [phone]

## 2016-10-12 NOTE — ED Notes (Signed)
Pt ambulated with steady gait in the hallway.

## 2016-10-12 NOTE — ED Triage Notes (Signed)
Per EMS, pt from home with increased weakness and lethargy. Wife went to wake pt to go to bed, pt slow to arouse and more lethargic than normal. A&O x 4 upon arrival. BP-120/70, HR-70, SpO2-95% ra, CBG-128

## 2016-10-12 NOTE — Discharge Instructions (Signed)
FOLLOW UP WITH YOUR DOCTOR FOR RECHECK THIS WEEK. RETURN TO THE EMERGENCY DEPARTMENT IF SYMPTOMS WORSEN OR FOR NEW CONCERN.

## 2016-10-13 LAB — URINE CULTURE: SPECIAL REQUESTS: NORMAL

## 2016-10-28 DIAGNOSIS — Z8673 Personal history of transient ischemic attack (TIA), and cerebral infarction without residual deficits: Secondary | ICD-10-CM | POA: Diagnosis not present

## 2016-10-28 DIAGNOSIS — I480 Paroxysmal atrial fibrillation: Secondary | ICD-10-CM | POA: Diagnosis not present

## 2016-10-28 DIAGNOSIS — E039 Hypothyroidism, unspecified: Secondary | ICD-10-CM | POA: Diagnosis not present

## 2016-10-28 DIAGNOSIS — E785 Hyperlipidemia, unspecified: Secondary | ICD-10-CM | POA: Diagnosis not present

## 2016-10-28 DIAGNOSIS — Z950 Presence of cardiac pacemaker: Secondary | ICD-10-CM | POA: Diagnosis not present

## 2016-10-28 DIAGNOSIS — Z7901 Long term (current) use of anticoagulants: Secondary | ICD-10-CM | POA: Diagnosis not present

## 2016-10-28 DIAGNOSIS — I951 Orthostatic hypotension: Secondary | ICD-10-CM | POA: Diagnosis not present

## 2016-10-28 DIAGNOSIS — G4733 Obstructive sleep apnea (adult) (pediatric): Secondary | ICD-10-CM | POA: Diagnosis not present

## 2016-11-23 NOTE — Patient Instructions (Signed)

## 2016-11-23 NOTE — Progress Notes (Signed)
[facility] Unk Pinto, M.D.        Uvaldo Bristle. Silverio Lay, P.A.-C       Starlyn Skeans, P.A.-C  [facility]                [address] [place] [id number] Telephone [phone] Telefax [phone] ______________________________________________________________________     This very nice 83 y.o. MWM presents for 3 month follow up with Hypertension,  ASHD/cAfib, Hyperlipidemia, Pre-Diabetes, Hypothyroidism. and Vitamin D Deficiency. Patient has Moderate SDAT and requires moderate supervision by wife and daughter. Patient's short term recallis unreliable and his med compliance is questionable.      Patient is treated for HTN & BP has been controlled at home. Patient has cAfib and is on Eliquis and also has hx/o chronic combined Systolic/Diastolic Heart failure. Patient had a PPM implanted in 2006 for SSS. Patient is followed by Dr Tilley/Cardiology. Today's BP is 142/86. Patient has had no complaints of any cardiac type chest pain, palpitations, dyspnea/orthopnea/PND, dizziness, claudication, or dependent edema. Patient had been on Coumadin for his cAfib and has been switched to Eliquis abnd he has an unstable gait and is considered High Fall Risk.      Hyperlipidemia is not controlled with diet & meds are deferred for age. Patient denies myalgias or other med SE's. Last Lipids were not at goal:   Lab Results  Component Value Date   CHOL 205 (H) 07/23/2016   HDL 70 07/23/2016   LDLCALC 123 07/23/2016   TRIG 59 07/23/2016   CHOLHDL 2.9 07/23/2016      Also, the patient has history of PreDiabetes since 2015 with A1c 5.9% and has had no symptoms of reactive hypoglycemia, diabetic polys, paresthesias or visual blurring.  Last A1c was 5.5% and at goal.    Patient is on thyroid replacement with monitoring in euthyroid range. Further, the patient also has history of Vitamin D Deficiency and supplements  vitamin D without any suspected side-effects. Last vitamin D was "37" very low & not at goal (70-100).   Lab Results  Component Value Date   Current Outpatient Prescriptions on File Prior to Visit  Medication Sig  . amiodarone  200 MG tablet Take 100 mg by mouth 2 (two) times daily. Taking half tablet daily  . ELIQUIS 5 MG TABS  Take 5 mg by mouth 2 (two) times daily.  Marland Kitchen VITAMIN D 1000 UNITS Take 2,000 Units by mouth daily.   . hydrocortisone 2.5 % crm Apply 1 application topically daily as needed (inflammation on face). To face  . metoprolol succ 25 MG Take 25 mg by mouth daily.  Marland Kitchen NAMENDA XR 28 MG CP24  TAKE 1 CAPSULE DAILY  . SYNTHROID 50 MCG  TAKE 1 TABLET DAILY BEFORE BREAKFAST   Allergies  Allergen Reactions  . Bee Venom Anaphylaxis   PMHx:   Past Medical History:  Diagnosis Date  . Atherosclerosis of aorta ([place]) 2010   noted on lumbar CT in 2010  . Atrial fibrillation ([place])   . Basal cell carcinoma, face   . Chronic sinusitis   . COPD (chronic obstructive pulmonary disease) ([place])    On CXR  . DJD (degenerative joint disease)   . History of right MCA stroke ~ 2002; 2006  . Hyperlipidemia   . Hypertension   . Hypothyroidism   . Hypothyroidism   . OSA on CPAP   .  Osteopenia   . Pacemaker medtronic   . Pneumonia 01/28/2016  . Prediabetes   . RLS (restless legs syndrome)   . Sepsis ([place]) 04/14/2016  . Sinoatrial node dysfunction (HCC)   . Vitamin D deficiency    Immunization History  Administered Date(s) Administered  . Influenza, High Dose Seasonal PF 09/18/2014, 09/09/2015, 07/23/2016  . Influenza-Unspecified 10/04/2013  . Pneumococcal Conjugate-13 12/19/2014  . Pneumococcal Polysaccharide-23 10/17/2013  . Td 12/08/2012   Past Surgical History:  Procedure Laterality Date  . BASAL CELL CARCINOMA EXCISION     "face"  . CARDIAC CATHETERIZATION  02/2004   normal coronary arteries  . INSERT / REPLACE / REMOVE PACEMAKER  04/2005  . PACEMAKER GENERATOR CHANGE N/A  05/04/2014   Procedure: PACEMAKER GENERATOR CHANGE;  Surgeon: [name], MD;  Location: [facility] CATH LAB;  Service: Cardiovascular;  Laterality: N/A;  . SHOULDER OPEN ROTATOR CUFF REPAIR Right 02/2008  . TRANSURETHRAL RESECTION OF PROSTATE     FHx:    Reviewed / unchanged  SHx:    Reviewed / unchanged  Systems Review:    Is not felt reliable due to his Dementia and confabulation.   Physical Exam  BP 142/76   P 72   T97.1 F   R 16   Ht 5\' 8"     Wt 173 lb 12.8 oz    BMI 26.43    Appears well nourished and in no distress.  Eyes: PERRLA, EOMs, conjunctiva no swelling or erythema. Sinuses: No frontal/maxillary tenderness ENT/Mouth: EAC's clear, TM's nl w/o erythema, bulging. Nares clear w/o erythema, swelling, exudates. Oropharynx clear without erythema or exudates. Oral hygiene is good. Tongue normal, non obstructing. Hearing intact.  Neck: Supple. Thyroid nl. Car 2+/2+ without bruits, nodes or JVD. Chest: Respirations nl with BS clear & equal w/o rales, rhonchi, wheezing or stridor.  Cor: Heart sounds normal w/ irregular rate and rhythm without sig. murmurs, gallops, clicks, or rubs. Peripheral pulses normal and equal  without edema.  Abdomen: Soft & bowel sounds normal. Non-tender w/o guarding, rebound, hernias, masses, or organomegaly.  Lymphatics: Unremarkable.  Musculoskeletal: Full ROM all peripheral extremities, joint stability, 5/5 strength and gait is broad-based and unsteady .  Skin: Warm, dry without exposed rashes, lesions or ecchymosis apparent.  Neuro: Cranial nerves intact, reflexes equal bilaterally. Sensory-motor testing grossly intact. Tendon reflexes flat. (+) snout and palmo-mental reflexes. Pysch: Alert & oriented x 2.  Insight and judgement limited and poor. Confabulates and is not felt reliable.  Assessment and Plan:  1. Essential hypertension  - Continue medication, monitor blood pressure at home.  - Continue DASH diet. Reminder to go to the ER if any CP,   SOB, nausea, dizziness, severe HA, changes vision/speech,  left arm numbness and tingling and jaw pain. - CBC with Differential/Platelet - BASIC METABOLIC PANEL WITH GFR - TSH  2. Mixed hyperlipidemia  - Continue diet/meds, exercise,& lifestyle modifications.  - Continue monitor periodic cholesterol/liver & renal functions   - Hepatic function panel - TSH  3. Prediabetes  - Continue diet, exercise, lifestyle modifications.  - Monitor appropriate labs. - Hemoglobin A1c - Insulin, random  4. Vitamin D deficiency  - Continue supplementation. - VITAMIN D 25 Hydroxy  5. Paroxysmal atrial fibrillation (HCC)  - TSH  6. Sick sinus syndrome (HCC)   7. Cardiac pacemaker in situ   8. Hypothyroidism  - TSH  9. Chronic combined systolic and diastolic congestive heart failure ([place])   10. OSA on CPAP   11. SDAT (senile  dementia of Alzheimer's type)   12. Medication management  - CBC with Differential/Platelet - BASIC METABOLIC PANEL WITH GFR - Hepatic function panel - Magnesium       Discussed with patient & his caretaker wife and recommended regular exercise, BP monitoring, weight control, and discussed med and SE's. Recommended labs to assess and monitor clinical status. Further disposition pending results of labs. Over 30 minutes of exam, counseling, chart review was performed

## 2016-11-24 ENCOUNTER — Encounter: Payer: Self-pay | Admitting: Internal Medicine

## 2016-11-24 ENCOUNTER — Ambulatory Visit (INDEPENDENT_AMBULATORY_CARE_PROVIDER_SITE_OTHER): Payer: Medicare Other | Admitting: Internal Medicine

## 2016-11-24 VITALS — BP 142/76 | HR 72 | Temp 97.1°F | Resp 16 | Ht 68.0 in | Wt 173.8 lb

## 2016-11-24 DIAGNOSIS — G301 Alzheimer's disease with late onset: Secondary | ICD-10-CM | POA: Diagnosis not present

## 2016-11-24 DIAGNOSIS — E782 Mixed hyperlipidemia: Secondary | ICD-10-CM

## 2016-11-24 DIAGNOSIS — Z950 Presence of cardiac pacemaker: Secondary | ICD-10-CM | POA: Diagnosis not present

## 2016-11-24 DIAGNOSIS — Z9989 Dependence on other enabling machines and devices: Secondary | ICD-10-CM

## 2016-11-24 DIAGNOSIS — Z79899 Other long term (current) drug therapy: Secondary | ICD-10-CM

## 2016-11-24 DIAGNOSIS — I5042 Chronic combined systolic (congestive) and diastolic (congestive) heart failure: Secondary | ICD-10-CM

## 2016-11-24 DIAGNOSIS — E039 Hypothyroidism, unspecified: Secondary | ICD-10-CM | POA: Diagnosis not present

## 2016-11-24 DIAGNOSIS — R7303 Prediabetes: Secondary | ICD-10-CM

## 2016-11-24 DIAGNOSIS — G4733 Obstructive sleep apnea (adult) (pediatric): Secondary | ICD-10-CM | POA: Diagnosis not present

## 2016-11-24 DIAGNOSIS — F0280 Dementia in other diseases classified elsewhere without behavioral disturbance: Secondary | ICD-10-CM

## 2016-11-24 DIAGNOSIS — I10 Essential (primary) hypertension: Secondary | ICD-10-CM | POA: Diagnosis not present

## 2016-11-24 DIAGNOSIS — I480 Paroxysmal atrial fibrillation: Secondary | ICD-10-CM

## 2016-11-24 DIAGNOSIS — I495 Sick sinus syndrome: Secondary | ICD-10-CM | POA: Diagnosis not present

## 2016-11-24 DIAGNOSIS — E559 Vitamin D deficiency, unspecified: Secondary | ICD-10-CM | POA: Diagnosis not present

## 2016-11-24 LAB — CBC WITH DIFFERENTIAL/PLATELET
BASOS ABS: 0 {cells}/uL (ref 0–200)
Basophils Relative: 0 %
EOS PCT: 3 %
Eosinophils Absolute: 207 cells/uL (ref 15–500)
HCT: 42 % (ref 38.5–50.0)
Hemoglobin: 13.9 g/dL (ref 13.2–17.1)
LYMPHS PCT: 21 %
Lymphs Abs: 1449 cells/uL (ref 850–3900)
MCH: 30.6 pg (ref 27.0–33.0)
MCHC: 33.1 g/dL (ref 32.0–36.0)
MCV: 92.5 fL (ref 80.0–100.0)
MONOS PCT: 7 %
MPV: 10.4 fL (ref 7.5–12.5)
Monocytes Absolute: 483 cells/uL (ref 200–950)
NEUTROS ABS: 4761 {cells}/uL (ref 1500–7800)
NEUTROS PCT: 69 %
PLATELETS: 178 10*3/uL (ref 140–400)
RBC: 4.54 MIL/uL (ref 4.20–5.80)
RDW: 14.1 % (ref 11.0–15.0)
WBC: 6.9 10*3/uL (ref 3.8–10.8)

## 2016-11-24 LAB — TSH: TSH: 6.24 m[IU]/L — AB (ref 0.40–4.50)

## 2016-11-25 LAB — HEPATIC FUNCTION PANEL
ALBUMIN: 4.1 g/dL (ref 3.6–5.1)
ALT: 10 U/L (ref 9–46)
AST: 13 U/L (ref 10–35)
Alkaline Phosphatase: 69 U/L (ref 40–115)
BILIRUBIN DIRECT: 0.1 mg/dL (ref <=0.2)
BILIRUBIN TOTAL: 0.5 mg/dL (ref 0.2–1.2)
Indirect Bilirubin: 0.4 mg/dL (ref 0.2–1.2)
Total Protein: 6.9 g/dL (ref 6.1–8.1)

## 2016-11-25 LAB — BASIC METABOLIC PANEL WITH GFR
BUN: 20 mg/dL (ref 7–25)
CALCIUM: 9.3 mg/dL (ref 8.6–10.3)
CO2: 25 mmol/L (ref 20–31)
CREATININE: 0.94 mg/dL (ref 0.70–1.11)
Chloride: 105 mmol/L (ref 98–110)
GFR, Est African American: 86 mL/min (ref >=60)
GFR, Est Non African American: 74 mL/min (ref >=60)
Glucose, Bld: 96 mg/dL (ref 65–99)
Potassium: 4.2 mmol/L (ref 3.5–5.3)
SODIUM: 140 mmol/L (ref 135–146)

## 2016-11-25 LAB — VITAMIN D 25 HYDROXY (VIT D DEFICIENCY, FRACTURES): Vit D, 25-Hydroxy: 25 ng/mL — ABNORMAL LOW (ref 30–100)

## 2016-11-25 LAB — HEMOGLOBIN A1C
Hgb A1c MFr Bld: 5.6 % (ref <5.7)
MEAN PLASMA GLUCOSE: 114 mg/dL

## 2016-11-25 LAB — MAGNESIUM: MAGNESIUM: 2.1 mg/dL (ref 1.5–2.5)

## 2016-11-25 LAB — INSULIN, RANDOM: INSULIN: 9.9 u[IU]/mL (ref 2.0–19.6)

## 2016-12-21 DIAGNOSIS — I495 Sick sinus syndrome: Secondary | ICD-10-CM | POA: Diagnosis not present

## 2016-12-21 DIAGNOSIS — Z950 Presence of cardiac pacemaker: Secondary | ICD-10-CM | POA: Diagnosis not present

## 2016-12-21 DIAGNOSIS — Z8673 Personal history of transient ischemic attack (TIA), and cerebral infarction without residual deficits: Secondary | ICD-10-CM | POA: Diagnosis not present

## 2016-12-21 DIAGNOSIS — Z7901 Long term (current) use of anticoagulants: Secondary | ICD-10-CM | POA: Diagnosis not present

## 2016-12-21 DIAGNOSIS — I480 Paroxysmal atrial fibrillation: Secondary | ICD-10-CM | POA: Diagnosis not present

## 2016-12-21 DIAGNOSIS — G4733 Obstructive sleep apnea (adult) (pediatric): Secondary | ICD-10-CM | POA: Diagnosis not present

## 2016-12-21 DIAGNOSIS — E039 Hypothyroidism, unspecified: Secondary | ICD-10-CM | POA: Diagnosis not present

## 2016-12-21 DIAGNOSIS — E785 Hyperlipidemia, unspecified: Secondary | ICD-10-CM | POA: Diagnosis not present

## 2016-12-21 DIAGNOSIS — I951 Orthostatic hypotension: Secondary | ICD-10-CM | POA: Diagnosis not present

## 2016-12-23 ENCOUNTER — Other Ambulatory Visit: Payer: Self-pay | Admitting: Internal Medicine

## 2016-12-24 ENCOUNTER — Encounter: Payer: Self-pay | Admitting: Physician Assistant

## 2017-01-04 ENCOUNTER — Telehealth: Payer: Self-pay | Admitting: Neurology

## 2017-01-04 ENCOUNTER — Other Ambulatory Visit: Payer: Self-pay

## 2017-01-04 MED ORDER — NAMENDA XR 28 MG PO CP24: 28.0000 mg | ORAL_CAPSULE | Freq: Every day | ORAL | 4 refills | Status: DC

## 2017-01-04 NOTE — Telephone Encounter (Signed)
Namenda sent to [facility]. Pt needs to come to schedule appt in April 2018 with Carolyn(NP).

## 2017-01-04 NOTE — Telephone Encounter (Signed)
Pt's wife request refill for NAMENDA XR 28 MG CP24 24 hr capsule sent to [facility]. She said his insurance has not changed. Pt has 3 tabs left.

## 2017-01-27 DIAGNOSIS — Z950 Presence of cardiac pacemaker: Secondary | ICD-10-CM | POA: Diagnosis not present

## 2017-02-10 DIAGNOSIS — L812 Freckles: Secondary | ICD-10-CM | POA: Diagnosis not present

## 2017-02-10 DIAGNOSIS — L821 Other seborrheic keratosis: Secondary | ICD-10-CM | POA: Diagnosis not present

## 2017-02-25 ENCOUNTER — Ambulatory Visit (HOSPITAL_COMMUNITY)
Admission: RE | Admit: 2017-02-25 | Discharge: 2017-02-25 | Disposition: A | Discharge status: Home / Self Care | Payer: Medicare Other | Source: Ambulatory Visit | Attending: Physician Assistant | Admitting: Physician Assistant

## 2017-02-25 ENCOUNTER — Ambulatory Visit (INDEPENDENT_AMBULATORY_CARE_PROVIDER_SITE_OTHER): Payer: Medicare Other | Admitting: Physician Assistant

## 2017-02-25 ENCOUNTER — Encounter: Payer: Self-pay | Admitting: Physician Assistant

## 2017-02-25 VITALS — BP 124/70 | HR 69 | Temp 97.5°F | Resp 16 | Ht 67.0 in | Wt 174.4 lb

## 2017-02-25 DIAGNOSIS — Z8673 Personal history of transient ischemic attack (TIA), and cerebral infarction without residual deficits: Secondary | ICD-10-CM

## 2017-02-25 DIAGNOSIS — G2581 Restless legs syndrome: Secondary | ICD-10-CM

## 2017-02-25 DIAGNOSIS — E039 Hypothyroidism, unspecified: Secondary | ICD-10-CM | POA: Diagnosis not present

## 2017-02-25 DIAGNOSIS — Z0001 Encounter for general adult medical examination with abnormal findings: Secondary | ICD-10-CM

## 2017-02-25 DIAGNOSIS — I10 Essential (primary) hypertension: Secondary | ICD-10-CM | POA: Diagnosis not present

## 2017-02-25 DIAGNOSIS — Z7901 Long term (current) use of anticoagulants: Secondary | ICD-10-CM

## 2017-02-25 DIAGNOSIS — R32 Unspecified urinary incontinence: Secondary | ICD-10-CM

## 2017-02-25 DIAGNOSIS — I700 Atherosclerosis of aorta: Secondary | ICD-10-CM

## 2017-02-25 DIAGNOSIS — E782 Mixed hyperlipidemia: Secondary | ICD-10-CM

## 2017-02-25 DIAGNOSIS — G4733 Obstructive sleep apnea (adult) (pediatric): Secondary | ICD-10-CM

## 2017-02-25 DIAGNOSIS — G301 Alzheimer's disease with late onset: Secondary | ICD-10-CM | POA: Diagnosis not present

## 2017-02-25 DIAGNOSIS — I495 Sick sinus syndrome: Secondary | ICD-10-CM

## 2017-02-25 DIAGNOSIS — R05 Cough: Secondary | ICD-10-CM | POA: Diagnosis not present

## 2017-02-25 DIAGNOSIS — Z950 Presence of cardiac pacemaker: Secondary | ICD-10-CM

## 2017-02-25 DIAGNOSIS — I480 Paroxysmal atrial fibrillation: Secondary | ICD-10-CM | POA: Diagnosis not present

## 2017-02-25 DIAGNOSIS — Z79899 Other long term (current) drug therapy: Secondary | ICD-10-CM

## 2017-02-25 DIAGNOSIS — R7303 Prediabetes: Secondary | ICD-10-CM

## 2017-02-25 DIAGNOSIS — N320 Bladder-neck obstruction: Secondary | ICD-10-CM | POA: Diagnosis not present

## 2017-02-25 DIAGNOSIS — R6889 Other general symptoms and signs: Secondary | ICD-10-CM | POA: Diagnosis not present

## 2017-02-25 DIAGNOSIS — J449 Chronic obstructive pulmonary disease, unspecified: Secondary | ICD-10-CM | POA: Insufficient documentation

## 2017-02-25 DIAGNOSIS — Z0000 Encounter for general adult medical examination without abnormal findings: Secondary | ICD-10-CM

## 2017-02-25 DIAGNOSIS — I5042 Chronic combined systolic (congestive) and diastolic (congestive) heart failure: Secondary | ICD-10-CM

## 2017-02-25 DIAGNOSIS — E559 Vitamin D deficiency, unspecified: Secondary | ICD-10-CM

## 2017-02-25 DIAGNOSIS — F0280 Dementia in other diseases classified elsewhere without behavioral disturbance: Secondary | ICD-10-CM

## 2017-02-25 MED ORDER — DOXYCYCLINE HYCLATE 100 MG PO CAPS: ORAL_CAPSULE | ORAL | 0 refills | Status: DC

## 2017-02-25 NOTE — Progress Notes (Addendum)
MEDICARE ANNUAL WELLNESS VISIT AND FOLLOW UP Assessment:    Paroxysmal atrial fibrillation (HCC) Continue cardio follow up  Essential hypertension - continue medications, DASH diet, exercise and monitor at home. Call if greater than 130/80.  -     CBC with Differential/Platelet -     BASIC METABOLIC PANEL WITH GFR -     Hepatic function panel -     Microalbumin / creatinine urine ratio  Atherosclerosis of aorta (HCC) Control blood pressure, cholesterol, glucose, increase exercise.   Chronic combined systolic and diastolic congestive heart failure (HCC) Weight up 4 lbs,but no edema, will get CXR  Chronic obstructive pulmonary disease, unspecified COPD type ([place]) -     DG Chest 2 View; Future - doxy, get cXR  OSA and COPD overlap syndrome ([place]) Continue CPAP  Hypothyroidism, unspecified type Hypothyroidism-check TSH level, continue medications the same, reminded to take on an empty stomach 30-43mins before food.  -     TSH  SDAT (senile dementia of Alzheimer's type) Check labs, rule out infection No focal deficits at this time May need repeat imaging, following up with neuro end of this month If all labs negative? Benefit from seroquel at night or sinemet -     Urine culture  BPH -     Urinalysis, Routine w reflex microscopic -     Urine culture  Mixed hyperlipidemia -continue medications, check lipids, decrease fatty foods, increase activity.  -     Lipid panel  History of stroke No focal deficits at this time  Current use of long term anticoagulation Continue cardio follow up  Cardiac pacemaker in situ Continue cardio follow up  Vitamin D deficiency Continue supplement  RLS (restless legs syndrome) Monitor labs  Medication management -     Magnesium  Prediabetes Discussed general issues about diabetes pathophysiology and management., Educational material distributed., Suggested low cholesterol diet., Encouraged aerobic exercise., Discussed foot care.,  Reminded to get yearly retinal exam.  Sick sinus syndrome ([place]) s/p pacemaker  Urinary incontinence, unspecified type -     doxycycline (VIBRAMYCIN) 100 MG capsule; Take 1 capsule twice daily with food -     Urine culture    Future Appointments Date Time Provider [place]  03/16/2017 9:45 AM Dennie Bible, NP [facility] None  07/23/2017 9:45 AM Unk Pinto, MD [facility] None  03/01/2018 10:00 AM Vicie Mutters, PA-C [facility] None      Plan:   During the course of the visit the patient was educated and counseled about appropriate screening and preventive services including:    Pneumococcal vaccine   Influenza vaccine  Td vaccine  Screening electrocardiogram  Colorectal cancer screening  Diabetes screening  Glaucoma screening  Nutrition counseling   Subjective:  Paul Davies is a 83 y.o. male who presents for Medicare Annual Wellness Visit and 3 month follow up for HTN, hyperlipidemia, prediabetes, and vitamin D Def.   His blood pressure has been controlled at home, today their BP is BP: 124/70 He does not workout, but he is very active. He denies chest pain, shortness of breath, dizziness.  He is on Coumadin for Afib and sees [name], he is on elliquis, he also has a pacemaker.  He denies any abnormal bleeding. He has history of COPD, has worsening cough, more mucus.  He is not on cholesterol medication and denies myalgias. His cholesterol is not at goal. The cholesterol last visit was:   Lab Results  Component Value Date   CHOL 205 (H) 07/23/2016  HDL 70 07/23/2016   LDLCALC 123 07/23/2016   TRIG 59 07/23/2016   CHOLHDL 2.9 07/23/2016   He has been working on diet and exercise for prediabetes, and denies paresthesia of the feet, polydipsia and polyuria. Last A1C in the office was:  Lab Results  Component Value Date   HGBA1C 5.6 11/24/2016   Patient is on Vitamin D supplement.   Lab Results  Component Value Date   VD25OH 25 (L)  11/24/2016     He is on thyroid medication. His medication was not changed last visit. Patient denies heat / cold intolerance, nervousness and palpitations.  Lab Results  Component Value Date   TSH 6.24 (H) 11/24/2016  .  He is on CPAP at night and states this helps.  He sees Dr. Leonie Man for early dementia he is on Namenda twice daily. Daughter is here to help provide history. Patient complains of more falls, having falls daily, no injuries, has had more incontinence over last 6 months as well. Had unchanged CT head 03/2016, no evidence of enlarged ventricles, wife also states he has had some hallucinations at night and not sleeping well.   BMI is Body mass index is 27.31 kg/m., he is working on diet and exercise. Wt Readings from Last 3 Encounters:  02/25/17 174 lb 6.4 oz (79.1 kg)  11/24/16 173 lb 12.8 oz (78.8 kg)  07/23/16 170 lb (77.1 kg)    Names of Other Physician/Practitioners you currently use: 1. [facility] here for primary care 2. Dr. Frederico Hamman , eye doctor, last visit Dec 2017 due this Dec 3. Dr. Carman Ching dentist, last visit q 6 months, 12/2016 Patient Care Team: Unk Pinto, MD as PCP - General (Internal Medicine) Jacolyn Reedy, MD as Consulting Physician (Cardiology) Rana Snare, MD as Consulting Physician (Urology) Garvin Fila, MD as Consulting Physician (Neurology)  Medication Review: Current Outpatient Prescriptions on File Prior to Visit  Medication Sig Dispense Refill  . amiodarone (PACERONE) 200 MG tablet Take 100 mg by mouth 2 (two) times daily. Taking half tablet daily    . apixaban (ELIQUIS) 5 MG TABS tablet Take 5 mg by mouth 2 (two) times daily.    . cholecalciferol (VITAMIN D) 1000 UNITS tablet Take 2,000 Units by mouth daily.     . hydrocortisone 2.5 % cream Apply 1 application topically daily as needed (inflammation on face). To face    . metoprolol succinate (TOPROL-XL) 25 MG 24 hr tablet Take 25 mg by mouth  daily.    Marland Kitchen NAMENDA XR 28 MG CP24 24 hr capsule Take 1 capsule (28 mg total) by mouth daily. 90 capsule 4  . SYNTHROID 50 MCG tablet TAKE 1 TABLET DAILY BEFORE BREAKFAST 90 tablet 1   No current facility-administered medications on file prior to visit.     Current Problems (verified) Patient Active Problem List   Diagnosis Date Noted  . Medication management 04/22/2016  . Prediabetes 04/22/2016  . Hypothyroidism 04/14/2016  . Chronic combined systolic and diastolic congestive heart failure ([place]) 01/30/2016  . Atherosclerosis of aorta ([place])   . Sick sinus syndrome ([place]) 12/19/2014  . COPD (chronic obstructive pulmonary disease) ([place]) 12/19/2014  . RLS (restless legs syndrome) 12/19/2014  . Cardiac pacemaker in situ   . Essential hypertension 03/19/2014  . Current use of long term anticoagulation   . OSA and COPD overlap syndrome ([place])   . SDAT (senile dementia of Alzheimer's type) 03/06/2014  . Hyperlipidemia   . Vitamin D deficiency   .  Abnormal blood sugar   . History of stroke   . Paroxysmal atrial fibrillation (HCC)   . BPH 12/20/2008    Screening Tests Immunization History  Administered Date(s) Administered  . Influenza, High Dose Seasonal PF 09/18/2014, 09/09/2015, 07/23/2016  . Influenza-Unspecified 10/04/2013  . Pneumococcal Conjugate-13 12/19/2014  . Pneumococcal Polysaccharide-23 10/17/2013  . Td 12/08/2012   Tetanus: 2014  Pneumovax: 2014  Prevnar: 2016 Flu vaccine: 2016 Zostavax: due, declines due to cost  DEXA: 08/2012 osteopenia DUE but declines Echo 2006 Stress test 01/2015 Colonoscopy: 01/15/2009 Dr. Ardis Hughs.  EGD: N/A  CXR 01/2013 COPD changes  Allergies Allergies  Allergen Reactions  . Bee Venom Anaphylaxis    SURGICAL HISTORY He  has a past surgical history that includes pacemaker generator change (N/A, 05/04/2014); Shoulder open rotator cuff repair (Right, 02/2008); Excision basal cell carcinoma; Insert / replace / remove pacemaker (04/2005);  Cardiac catheterization (02/2004); and Transurethral resection of prostate. FAMILY HISTORY His family history includes Dementia in his sister; Heart attack in his father; Heart attack (age of onset: 60) in his brother; Stroke in his sister. SOCIAL HISTORY He  reports that he is a non-smoker but has been exposed to tobacco smoke. He has never used smokeless tobacco. He reports that he does not drink alcohol or use drugs.  MEDICARE WELLNESS OBJECTIVES: Physical activity: Current Exercise Habits: The patient does not participate in regular exercise at present Cardiac risk factors: Cardiac Risk Factors include: advanced age (>31men, >32 women);dyslipidemia;hypertension;male gender;obesity (BMI >30kg/m2);sedentary lifestyle Depression/mood screen:   Depression screen [facility] 2/9 02/25/2017  Decreased Interest 0  Down, Depressed, Hopeless 0  PHQ - 2 Score 0    ADLs:  In your present state of health, do you have any difficulty performing the following activities: 02/25/2017 11/24/2016  Hearing? Tempie Donning  Vision? N N  Difficulty concentrating or making decisions? Tempie Donning  Walking or climbing stairs? Y Y  Dressing or bathing? Y Y  Doing errands, shopping? Tempie Donning  Preparing Food and eating ? Y -  Using the Toilet? Y -  In the past six months, have you accidently leaked urine? - -  Do you have problems with loss of bowel control? N -  Managing your Medications? Y -  Managing your Finances? Y -  Housekeeping or managing your Housekeeping? Y -  Some recent data might be hidden     Cognitive Testing  Alert? Yes  Normal Appearance?Yes  Oriented to person? Yes  Place? Yes   Time? Yes  Recall of three objects?  No  Can perform simple calculations? Yes  Displays appropriate judgment?Yes  Can read the correct time from a watch face?Yes  EOL planning: Does Patient Have a Medical Advance Directive?: Yes Type of Advance Directive: Healthcare Power of Attorney, Living will [place] in  Chart?: No - copy requested   Objective:   Blood pressure 124/70, pulse 69, temperature 97.5 F (36.4 C), resp. rate 16, height 5\' 7"  (1.702 m), weight 174 lb 6.4 oz (79.1 kg), SpO2 96 %. Body mass index is 27.31 kg/m.  General appearance: alert, no distress, WD/WN, male General Appearance: Well nourished, in no apparent distress. Eyes: PERRLA, EOMs, conjunctiva no swelling or erythema, normal fundi and vessels. Sinuses: No Frontal/maxillary tenderness ENT/Mouth: Ext aud canals clear, normal light reflex with TMs without erythema, bulging. Good dentition. No erythema, swelling, or exudate on post pharynx. Tonsils not swollen or erythematous. Hearing decreased Neck: Supple, thyroid normal. No bruits Respiratory: Respiratory effort normal,  BS decreased bilaterally with rhonchi bilateral lungs, without rales, wheezing or stridor. Cardio: Irreg, Irreg with 2/6 systolic murmur without rubs or gallops. Brisk peripheral pulses without edema.  Chest: symmetric, with normal excursions and percussion. Abdomen: Soft, +BS. Non tender, no guarding, rebound, hernias, masses, or organomegaly.  Lymphatics: Non tender without lymphadenopathy.  Genitourinary: defer Musculoskeletal: Full ROM all peripheral extremities,4/5 strength Skin: Warm, dry without rashes, ecchymosis. Several places on his face,ears, and left temple that need to be frozen/removed.  Neuro: Cranial nerves intact, reflexes equal bilaterally. Normal muscle tone, no cerebellar symptoms. Sensation intact bilateral feet. Slow to get up from chair, shuffling gait, slightly unsteady.  Psych: Awake and oriented X 3, flat affect, Insight and Judgment appropriate.   Medicare Attestation I have personally reviewed: The patient's medical and social history Their use of alcohol, tobacco or illicit drugs Their current medications and supplements The patient's functional ability including ADLs,fall risks, home safety risks, cognitive, and hearing  and visual impairment Diet and physical activities Evidence for depression or mood disorders  The patient's weight, height, BMI, and visual acuity have been recorded in the chart.  I have made referrals, counseling, and provided education to the patient based on review of the above and I have provided the patient with a written personalized care plan for preventive services.     Vicie Mutters, PA-C   02/25/2017

## 2017-02-25 NOTE — Patient Instructions (Addendum)
Cut the metoprolol 1/2 pill a day Will check urine/chest xray, start on doxycycline for possible COPD Follow up neurology    Hypotension As your heart beats, it forces blood through your body. This force is called blood pressure. If you have hypotension, you have low blood pressure. When your blood pressure is too low, you may not get enough blood to your brain. You may feel weak, feel light-headed, have a fast heartbeat, or even pass out (faint). Follow these instructions at home: Eating and drinking   Drink enough fluids to keep your pee (urine) clear or pale yellow.  Eat a healthy diet, and follow instructions from your doctor about eating or drinking restrictions. A healthy diet includes:  Fresh fruits and vegetables.  Whole grains.  Low-fat (lean) meats.  Low-fat dairy products.  Eat extra salt only as told. Do not add extra salt to your diet unless your doctor tells you to.  Eat small meals often.  Avoid standing up quickly after you eat. Medicines   Take over-the-counter and prescription medicines only as told by your doctor.  Follow instructions from your doctor about changing how much you take (the dosage) of your medicines, if this applies.  Do not stop or change your medicine on your own. General instructions   Wear compression stockings as told by your doctor.  Get up slowly from lying down or sitting.  Avoid hot showers and a lot of heat as told by your doctor.  Return to your normal activities as told by your doctor. Ask what activities are safe for you.  Do not use any products that contain nicotine or tobacco, such as cigarettes and e-cigarettes. If you need help quitting, ask your doctor.  Keep all follow-up visits as told by your doctor. This is important. Contact a doctor if:  You throw up (vomit).  You have watery poop (diarrhea).  You have a fever for more than 2-3 days.  You feel more thirsty than normal.  You feel weak and tired. Get  help right away if:  You have chest pain.  You have a fast or irregular heartbeat.  You lose feeling (get numbness) in any part of your body.  You cannot move your arms or your legs.  You have trouble talking.  You get sweaty or feel light-headed.  You faint.  You have trouble breathing.  You have trouble staying awake.  You feel confused. This information is not intended to replace advice given to you by your health care provider. Make sure you discuss any questions you have with your health care provider. Document Released: 02/03/2010 Document Revised: 07/28/2016 Document Reviewed: 07/28/2016 Elsevier Interactive Patient Education  2017 [employer].

## 2017-02-26 LAB — HEPATIC FUNCTION PANEL
ALT: 10 U/L (ref 9–46)
AST: 13 U/L (ref 10–35)
Albumin: 4.1 g/dL (ref 3.6–5.1)
Alkaline Phosphatase: 65 U/L (ref 40–115)
Bilirubin, Direct: 0.1 mg/dL (ref <=0.2)
Indirect Bilirubin: 0.4 mg/dL (ref 0.2–1.2)
Total Bilirubin: 0.5 mg/dL (ref 0.2–1.2)
Total Protein: 6.8 g/dL (ref 6.1–8.1)

## 2017-02-26 LAB — CBC WITH DIFFERENTIAL/PLATELET
Basophils Absolute: 0 cells/uL (ref 0–200)
Basophils Relative: 0 %
Eosinophils Absolute: 272 cells/uL (ref 15–500)
Eosinophils Relative: 4 %
HCT: 40.9 % (ref 38.5–50.0)
Hemoglobin: 13.3 g/dL (ref 13.2–17.1)
Lymphocytes Relative: 22 %
Lymphs Abs: 1496 cells/uL (ref 850–3900)
MCH: 30 pg (ref 27.0–33.0)
MCHC: 32.5 g/dL (ref 32.0–36.0)
MCV: 92.3 fL (ref 80.0–100.0)
MPV: 10.5 fL (ref 7.5–12.5)
Monocytes Absolute: 544 cells/uL (ref 200–950)
Monocytes Relative: 8 %
Neutro Abs: 4488 cells/uL (ref 1500–7800)
Neutrophils Relative %: 66 %
Platelets: 189 10*3/uL (ref 140–400)
RBC: 4.43 MIL/uL (ref 4.20–5.80)
RDW: 14.3 % (ref 11.0–15.0)
WBC: 6.8 10*3/uL (ref 3.8–10.8)

## 2017-02-26 LAB — TSH: TSH: 6.12 m[IU]/L — AB (ref 0.40–4.50)

## 2017-02-26 LAB — URINALYSIS, ROUTINE W REFLEX MICROSCOPIC
Bilirubin Urine: NEGATIVE
Glucose, UA: NEGATIVE
Ketones, ur: NEGATIVE
Leukocytes, UA: NEGATIVE
Nitrite: NEGATIVE
Protein, ur: NEGATIVE
Specific Gravity, Urine: 1.026 (ref 1.001–1.035)
pH: 6 (ref 5.0–8.0)

## 2017-02-26 LAB — BASIC METABOLIC PANEL WITH GFR
BUN: 25 mg/dL (ref 7–25)
CALCIUM: 9 mg/dL (ref 8.6–10.3)
CHLORIDE: 104 mmol/L (ref 98–110)
CO2: 25 mmol/L (ref 20–31)
Creat: 1.06 mg/dL (ref 0.70–1.11)
GFR, EST NON AFRICAN AMERICAN: 64 mL/min (ref >=60)
GFR, Est African American: 74 mL/min (ref >=60)
Glucose, Bld: 92 mg/dL (ref 65–99)
Potassium: 4.5 mmol/L (ref 3.5–5.3)
SODIUM: 138 mmol/L (ref 135–146)

## 2017-02-26 LAB — URINE CULTURE: Organism ID, Bacteria: NO GROWTH

## 2017-02-26 LAB — URINALYSIS, MICROSCOPIC ONLY
Casts: NONE SEEN [LPF]
Squamous Epithelial / LPF: NONE SEEN [HPF] (ref <=5)
WBC, UA: NONE SEEN WBC/HPF (ref <=5)
Yeast: NONE SEEN [HPF]

## 2017-02-26 LAB — MICROALBUMIN / CREATININE URINE RATIO
CREATININE, URINE: 187 mg/dL (ref 20–370)
Microalb Creat Ratio: 2 mcg/mg creat (ref <30)
Microalb, Ur: 0.4 mg/dL

## 2017-02-26 LAB — LIPID PANEL
CHOL/HDL RATIO: 2.9 ratio (ref <5.0)
CHOLESTEROL: 205 mg/dL — AB (ref <200)
HDL: 71 mg/dL (ref >40)
LDL Cholesterol: 114 mg/dL — ABNORMAL HIGH (ref <100)
Triglycerides: 98 mg/dL (ref <150)
VLDL: 20 mg/dL (ref <30)

## 2017-02-26 LAB — MAGNESIUM: Magnesium: 2.2 mg/dL (ref 1.5–2.5)

## 2017-03-01 NOTE — Progress Notes (Signed)
Pt's wife was made aware of lab results & voiced understanding of those results.

## 2017-03-04 DIAGNOSIS — Z8673 Personal history of transient ischemic attack (TIA), and cerebral infarction without residual deficits: Secondary | ICD-10-CM | POA: Diagnosis not present

## 2017-03-04 DIAGNOSIS — I639 Cerebral infarction, unspecified: Secondary | ICD-10-CM | POA: Diagnosis not present

## 2017-03-04 DIAGNOSIS — Z7901 Long term (current) use of anticoagulants: Secondary | ICD-10-CM | POA: Diagnosis not present

## 2017-03-04 DIAGNOSIS — Z950 Presence of cardiac pacemaker: Secondary | ICD-10-CM | POA: Diagnosis not present

## 2017-03-04 DIAGNOSIS — I480 Paroxysmal atrial fibrillation: Secondary | ICD-10-CM | POA: Diagnosis not present

## 2017-03-04 DIAGNOSIS — E785 Hyperlipidemia, unspecified: Secondary | ICD-10-CM | POA: Diagnosis not present

## 2017-03-04 DIAGNOSIS — E039 Hypothyroidism, unspecified: Secondary | ICD-10-CM | POA: Diagnosis not present

## 2017-03-04 DIAGNOSIS — H04129 Dry eye syndrome of unspecified lacrimal gland: Secondary | ICD-10-CM | POA: Diagnosis not present

## 2017-03-04 DIAGNOSIS — I951 Orthostatic hypotension: Secondary | ICD-10-CM | POA: Diagnosis not present

## 2017-03-04 DIAGNOSIS — G4733 Obstructive sleep apnea (adult) (pediatric): Secondary | ICD-10-CM | POA: Diagnosis not present

## 2017-03-04 DIAGNOSIS — H538 Other visual disturbances: Secondary | ICD-10-CM | POA: Diagnosis not present

## 2017-03-04 DIAGNOSIS — R42 Dizziness and giddiness: Secondary | ICD-10-CM | POA: Diagnosis not present

## 2017-03-06 ENCOUNTER — Telehealth: Payer: Self-pay | Admitting: Neurology

## 2017-03-06 NOTE — Telephone Encounter (Signed)
Daughter called with re:  Patient has been declining functionally, more weaker, not walking as well, cognitive decline.  Had seen PCP, placed on Abx empirically for infection, cardiologist recommending stopping it.  Pt was see by Dr. Leonie Man a year ago last. Daughter is encouraged to make FU appointment. Difficult to say what is going on, I explained, condition may be d/t electrolyte dist/infection/dehydration etc. She may have to take him to ER if acute changes.  She was reminded to call on Mon in order to make FU appt.  She verbalized understanding and agreement.

## 2017-03-08 NOTE — Telephone Encounter (Signed)
Thanks agree with your plan

## 2017-03-16 ENCOUNTER — Ambulatory Visit (INDEPENDENT_AMBULATORY_CARE_PROVIDER_SITE_OTHER): Payer: Medicare Other | Admitting: Nurse Practitioner

## 2017-03-16 ENCOUNTER — Encounter: Payer: Self-pay | Admitting: Nurse Practitioner

## 2017-03-16 VITALS — BP 117/69

## 2017-03-16 DIAGNOSIS — R296 Repeated falls: Secondary | ICD-10-CM | POA: Diagnosis not present

## 2017-03-16 DIAGNOSIS — Z8673 Personal history of transient ischemic attack (TIA), and cerebral infarction without residual deficits: Secondary | ICD-10-CM

## 2017-03-16 DIAGNOSIS — E782 Mixed hyperlipidemia: Secondary | ICD-10-CM

## 2017-03-16 DIAGNOSIS — I480 Paroxysmal atrial fibrillation: Secondary | ICD-10-CM | POA: Diagnosis not present

## 2017-03-16 DIAGNOSIS — F0280 Dementia in other diseases classified elsewhere without behavioral disturbance: Secondary | ICD-10-CM | POA: Diagnosis not present

## 2017-03-16 DIAGNOSIS — G301 Alzheimer's disease with late onset: Secondary | ICD-10-CM | POA: Diagnosis not present

## 2017-03-16 MED ORDER — NAMENDA XR 28 MG PO CP24: 28.0000 mg | ORAL_CAPSULE | Freq: Every day | ORAL | 3 refills | Status: DC

## 2017-03-16 NOTE — Patient Instructions (Addendum)
Continue Namenda ER will  Refill Reviewed the importance of managing stroke risk factors, of hyperlipedemia,and atrial fib . Continue Eliquis for secondary stroke prevention and atrial fibrillation Keep systolic blood pressure less than 130, today's reading 117/69 Reviewed recent lipid panel, strive to keep cholesterol less than 200 most recent 205. LDL less than 100 most recent 114. Due to multiple falls will check CT of the head Unable to have   MRI of the brain due to pacemaker for recurrent stroke Be careful with ambulation Follow up 6 months

## 2017-03-16 NOTE — Progress Notes (Signed)
[facility]  PATIENT: Paul Davies DOB: [birth year]   REASON FOR VISIT: follow up for dementia HISTORY FROM:patient and daughter    HISTORY OF PRESENT ILLNESS:UPDATE 04/24/2018CM Paul Davies, 83 year old male returns for follow-up with a history of dementia  as well as remote right MCA branch infarct secondary to cardiac embolism from atrial fibrillation in June 2006 who is seen today for followup after last visit 03/16/16. The patient is accompanied today by his daughter who claims he has had problems with his memory over the last year he's had pneumonia twice and admitted to the hospital for atrial fibrillation. He requires supervision with activities of daily living. He no longer drives . He has no issues with agitation, delusions, but does have occasional hallucinations maybe twice a month. Sees people in the house. Daughter also reports that he has been falling a lot, he so far has not injured himself. He does not use an assistive device  He has been on Namenda 10 mg twice daily for several years.  He has recently been evaluated by ophthalmology and according to the daughter there was a question of stroke.  His memory score has dropped . is stable Update 03/16/2016 : He returns for follow-up after last visit to 1 year ago. Is a complaint by his wife. Patient was admitted to the hospital in March for 5 days for congestive heart failure and pneumonia flareup. He was confused and disoriented during this hospitalization but has gradually recovered back to his baseline since discharge. He has finished course of Zithromax. He was taken back to the emergency room a few weeks later for dehydration and improved after IV fluids. The patient's wife states that cognitively he is about the same. Continues to have mild short-term memory difficulties. He however remains quite independent in most activities of daily living though he needs some supervision. His blood pressure is well  controlled and today it is 138/68. He remains on warfarin which is tolerating well without bleeding or bruising but INR has been fluctuating up and down. 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 He has not had any further episodes of confusion, delusions, hallucination, agitation or behavioral disturbances. He remains on Namenda which is tolerating well without side effects.  REVIEW OF SYSTEMS: Full 14 system review of systems performed and notable only for those listed, all others are neg:  Constitutional:fatigue Cardiovascular: Pacemaker Ear/Nose/Throat: Hearing loss  Skin: neg Eyes: neg Respiratory: neg Gastroitestinal: Incontinence of urine  Hematology/Lymphatic: neg  Endocrine: neg Musculoskeletal walking difficulty with falls Allergy/Immunology: neg Neurological: neg Psychiatric   Confusion, rare hallucination Sleep : neg   ALLERGIES: Allergies  Allergen Reactions  . Bee Venom Anaphylaxis    HOME MEDICATIONS: Outpatient Medications Prior to Visit  Medication Sig Dispense Refill  . amiodarone (PACERONE) 200 MG tablet Take 100 mg by mouth 2 (two) times daily. Taking half tablet daily    . apixaban (ELIQUIS) 5 MG TABS tablet Take 5 mg by mouth 2 (two) times daily.    . cholecalciferol (VITAMIN D) 1000 UNITS tablet Take 2,000 Units by mouth daily.     . hydrocortisone 2.5 % cream Apply 1 application topically daily as needed (inflammation on face). To face    . NAMENDA XR 28 MG CP24 24 hr capsule Take 1 capsule (28 mg total) by mouth daily. 90 capsule 4  . SYNTHROID 50 MCG tablet TAKE 1 TABLET DAILY  BEFORE BREAKFAST 90 tablet 1  . doxycycline (VIBRAMYCIN) 100 MG capsule Take 1 capsule twice daily with food 20 capsule 0  . metoprolol succinate (TOPROL-XL) 25 MG 24 hr tablet Take 25 mg by mouth daily.     No facility-administered medications prior to visit.     PAST MEDICAL HISTORY: Past Medical History:    Diagnosis Date  . Atherosclerosis of aorta ([place]) 2010   noted on lumbar CT in 2010  . Atrial fibrillation ([place])   . Basal cell carcinoma, face   . Chronic sinusitis   . COPD (chronic obstructive pulmonary disease) ([place])    On CXR  . DJD (degenerative joint disease)   . History of right MCA stroke ~ 2002; 2006  . Hyperlipidemia   . Hypertension   . Hypothyroidism   . Hypothyroidism   . OSA on CPAP   . Osteopenia   . Pacemaker medtronic   . Pneumonia 01/28/2016  . Prediabetes   . RLS (restless legs syndrome)   . Sepsis ([place]) 04/14/2016  . Sinoatrial node dysfunction (HCC)   . Vitamin D deficiency     PAST SURGICAL HISTORY: Past Surgical History:  Procedure Laterality Date  . BASAL CELL CARCINOMA EXCISION     "face"  . CARDIAC CATHETERIZATION  02/2004   normal coronary arteries  . INSERT / REPLACE / REMOVE PACEMAKER  04/2005  . PACEMAKER GENERATOR CHANGE N/A 05/04/2014   Procedure: PACEMAKER GENERATOR CHANGE;  Surgeon: [name], MD;  Location: [facility] C[facility] LAB;  Service: Cardiovascular;  Laterality: N/A;  . SHOULDER OPEN ROTATOR CUFF REPAIR Right 02/2008  . TRANSURETHRAL RESECTION OF PROSTATE      FAMILY HISTORY: Family History  Problem Relation Age of Onset  . Heart attack Father   . Stroke Sister   . Dementia Sister   . Heart attack Brother 39    SOCIAL HISTORY: Social History   Social History  . Marital status: Married    Spouse name: N/A  . Number of children: 5  . Years of education: 56   Occupational History  . Retired    Social History Main Topics  . Smoking status: Passive Smoke Exposure - Never Smoker  . Smokeless tobacco: Never Used  . Alcohol use No  . Drug use: No  . Sexual activity: Not on file   Other Topics Concern  . Not on file   Social History Narrative   Patient lives at home with his wife    Patient drinks coffee daily     PHYSICAL EXAM  Vitals:   03/16/17 1004  BP: 117/69   There is no height or weight on file to  calculate BMI.  Generalized: Well developed, in no acute distress  Head: normocephalic and atraumatic,. Oropharynx benign  Neck: Supple, no carotid bruits  Cardiac: Regular rate rhythm, no murmur  Musculoskeletal: No deformity   Neurological examination   Mentation: Alert , AFT 11. Clock drawing 4/4. MMSE - Mini Mental State Exam 03/16/2017 09/09/2015  Orientation to time 1 4  Orientation to Place 5 5  Registration 2 3  Attention/ Calculation 2 4  Recall 1 2  Language- name 2 objects 2 2  Language- repeat 1 1  Language- follow 3 step command 3 3  Language- read & follow direction 1 1  Write a sentence 1 1  Copy design 1 1  Total score 20 27  Follows all commands speech and language fluent.. Hard of hearing   Cranial nerve II-XII: Fundoscopic exam reveals  sharp disc margins.Pupils were equal round reactive to light extraocular movements were full, visual field were full on confrontational test. Facial sensation and strength were normal. hearing was intact to finger rubbing bilaterally. Uvula tongue midline. head turning and shoulder shrug were normal and symmetric.Tongue protrusion into cheek strength was normal. Motor: normal bulk and tone, full strength in the BUE, BLE,  Sensory: normal and symmetric to light touch, pinprick, and  Vibration,In the upper and lower extremities  Coordination: finger-nose-finger, heel-to-shin bilaterally, no dysmetria Reflexes: 1+ upper lower and symmetric, plantar responses were flexor bilaterally. Gait and Station: Rising up from seated position without assistance, stooped stance, unable to perform tiptoe, and heel walking without difficulty. Tandem gait is unsteady. No assistive device  DIAGNOSTIC DATA (LABS, IMAGING, TESTING) - I reviewed patient records, labs, notes, testing and imaging myself where available.  Lab Results  Component Value Date   WBC 6.8 02/25/2017   HGB 13.3 02/25/2017   HCT 40.9 02/25/2017   MCV 92.3 02/25/2017   PLT 189  02/25/2017      Component Value Date/Time   NA 138 02/25/2017 1109   K 4.5 02/25/2017 1109   CL 104 02/25/2017 1109   CO2 25 02/25/2017 1109   GLUCOSE 92 02/25/2017 1109   BUN 25 02/25/2017 1109   CREATININE 1.06 02/25/2017 1109   CALCIUM 9.0 02/25/2017 1109   PROT 6.8 02/25/2017 1109   ALBUMIN 4.1 02/25/2017 1109   AST 13 02/25/2017 1109   ALT 10 02/25/2017 1109   ALKPHOS 65 02/25/2017 1109   BILITOT 0.5 02/25/2017 1109   GFRNONAA 64 02/25/2017 1109   GFRAA 74 02/25/2017 1109   Lab Results  Component Value Date   CHOL 205 (H) 02/25/2017   HDL 71 02/25/2017   LDLCALC 114 (H) 02/25/2017   TRIG 98 02/25/2017   CHOLHDL 2.9 02/25/2017   Lab Results  Component Value Date   HGBA1C 5.6 11/24/2016   No results found for: [id number] Lab Results  Component Value Date   TSH 6.12 (H) 02/25/2017      ASSESSMENT AND PLAN 83y.o. year old male  has a past medical history of Stroke; Hyperlipidemia; Hypertension; OSA (obstructive sleep apnea); RLS (restless legs syndrome);Pacemaker medtronic; DJD (degenerative joint disease); Atherosclerosis of aorta (2010); COPD (chronic obstructive pulmonary disease); Sinoatrial node dysfunction; Prediabetes; and Atrial fibrillation. Here to follow up. Pt has been falling more frequently .  Continue Namenda ER will  Refill Reviewed the importance of managing stroke risk factors, of hyperlipedemia,and atrial fib . Continue Eliquis for secondary stroke prevention and atrial fibrillation Keep systolic blood pressure less than 130, today's reading 117/69 Reviewed recent lipid panel, strive to keep cholesterol less than 200 most recent 205. LDL less than 100 most recent 114. Due to multiple falls will check CT of the brain for recurrent stroke Be careful with ambulation Follow up 6 months Dennie Bible, [facility], [facility], APRN  [facility] [address] [place], [place] [address] [phone]

## 2017-03-17 NOTE — Progress Notes (Signed)
I agree with the above plan 

## 2017-03-19 ENCOUNTER — Ambulatory Visit
Admission: RE | Admit: 2017-03-19 | Discharge: 2017-03-19 | Disposition: A | Discharge status: Home / Self Care | Payer: Medicare Other | Source: Ambulatory Visit | Attending: Nurse Practitioner | Admitting: Nurse Practitioner

## 2017-03-19 DIAGNOSIS — G301 Alzheimer's disease with late onset: Secondary | ICD-10-CM | POA: Diagnosis not present

## 2017-03-19 DIAGNOSIS — R296 Repeated falls: Secondary | ICD-10-CM

## 2017-03-19 DIAGNOSIS — I480 Paroxysmal atrial fibrillation: Secondary | ICD-10-CM

## 2017-03-19 DIAGNOSIS — Z8673 Personal history of transient ischemic attack (TIA), and cerebral infarction without residual deficits: Secondary | ICD-10-CM

## 2017-03-22 ENCOUNTER — Telehealth: Payer: Self-pay | Admitting: *Deleted

## 2017-03-22 NOTE — Telephone Encounter (Signed)
Spoke to wife and relayed the results of CT.  She stated that he has hallucinations more often then when she stated when in the office.  Is there medications that may help this?  I relayed LMVM for angie, daughter who was in the office with pt about CT (no change from 04-13-16 CT).

## 2017-03-23 NOTE — Telephone Encounter (Signed)
Telephone call to daughter Janace Hoard who is with patient on his visit. We have discussed his hallucinations and the medications that we use and the risk versus benefit. She decided that she did not want him to be on any medication. She will call her mother and discuss this and let us know

## 2017-03-30 NOTE — Telephone Encounter (Signed)
Pt's wife called said he is hallucinating about doing things. She is concerned he is going to hurt himself trying to plant a garden with a tractor. Please call her at [phone]

## 2017-03-30 NOTE — Telephone Encounter (Signed)
LMVM for daughter, Janace Hoard, that received call from pts wife, her mother about pt having hallucinations and that she feels will hurt himself.  I know that CM/NP and angie have spoken about medications to help with this.   I called and spoke with wife.  She states that he at times will wake her up sometimes 2-3 times night.  She can usually talk him out of what he sees, and he will settle back down.  I told her that I LMVM for her daughter Janace Hoard, about this.  She has several caregivers that help her out.

## 2017-05-06 ENCOUNTER — Other Ambulatory Visit: Payer: Self-pay | Admitting: *Deleted

## 2017-05-06 ENCOUNTER — Other Ambulatory Visit: Payer: Self-pay | Admitting: Internal Medicine

## 2017-05-06 ENCOUNTER — Telehealth: Payer: Self-pay | Admitting: *Deleted

## 2017-05-06 MED ORDER — TAMSULOSIN HCL 0.4 MG PO CAPS: ORAL_CAPSULE | ORAL | 0 refills | Status: DC

## 2017-05-06 MED ORDER — QUETIAPINE FUMARATE 25 MG PO TABS: ORAL_TABLET | ORAL | 0 refills | Status: DC

## 2017-05-06 MED ORDER — TAMSULOSIN HCL 0.4 MG PO CAPS: 0.4000 mg | ORAL_CAPSULE | Freq: Every day | ORAL | 2 refills | Status: DC

## 2017-05-06 NOTE — Telephone Encounter (Signed)
Patient's spouse called and states the patient was constipated ,but has had a bowel movement.  He is unable to have a good urine flow and an RX was sent to [facility] for Tamsulosin 0.4 mg daily.Marland Kitchen Spouse is aware.

## 2017-05-06 NOTE — Telephone Encounter (Signed)
Patient's daughter Freda Munro, called and states the patient is very agitated, confused and has threatened to harm his spouse.  Per Dr Melford Aase, an RX was sent in for Seroquel 25 mg to take up to 3 times a day for agitation.  The spouse was called and she states they will try the medication and declined a Hospice referral at this time.  She was advised to take the patient to [facility] ER if he became a danger to himself or others.

## 2017-05-12 DIAGNOSIS — R338 Other retention of urine: Secondary | ICD-10-CM | POA: Diagnosis not present

## 2017-05-12 DIAGNOSIS — N401 Enlarged prostate with lower urinary tract symptoms: Secondary | ICD-10-CM | POA: Diagnosis not present

## 2017-05-19 DIAGNOSIS — R338 Other retention of urine: Secondary | ICD-10-CM | POA: Diagnosis not present

## 2017-05-26 ENCOUNTER — Encounter (HOSPITAL_COMMUNITY): Payer: Self-pay | Admitting: Emergency Medicine

## 2017-05-26 ENCOUNTER — Emergency Department (HOSPITAL_COMMUNITY): Payer: Medicare Other

## 2017-05-26 ENCOUNTER — Inpatient Hospital Stay (HOSPITAL_COMMUNITY)
Admission: EM | Admit: 2017-05-26 | Discharge: 2017-05-29 | DRG: 698 | Disposition: A | Discharge status: Home / Self Care | Payer: Medicare Other | Attending: Internal Medicine | Admitting: Internal Medicine

## 2017-05-26 DIAGNOSIS — Z7901 Long term (current) use of anticoagulants: Secondary | ICD-10-CM | POA: Diagnosis not present

## 2017-05-26 DIAGNOSIS — N401 Enlarged prostate with lower urinary tract symptoms: Secondary | ICD-10-CM | POA: Diagnosis present

## 2017-05-26 DIAGNOSIS — I10 Essential (primary) hypertension: Secondary | ICD-10-CM | POA: Diagnosis present

## 2017-05-26 DIAGNOSIS — G9341 Metabolic encephalopathy: Secondary | ICD-10-CM | POA: Diagnosis present

## 2017-05-26 DIAGNOSIS — R7303 Prediabetes: Secondary | ICD-10-CM | POA: Diagnosis present

## 2017-05-26 DIAGNOSIS — N390 Urinary tract infection, site not specified: Secondary | ICD-10-CM | POA: Diagnosis not present

## 2017-05-26 DIAGNOSIS — I480 Paroxysmal atrial fibrillation: Secondary | ICD-10-CM | POA: Diagnosis not present

## 2017-05-26 DIAGNOSIS — E785 Hyperlipidemia, unspecified: Secondary | ICD-10-CM | POA: Diagnosis present

## 2017-05-26 DIAGNOSIS — Z8673 Personal history of transient ischemic attack (TIA), and cerebral infarction without residual deficits: Secondary | ICD-10-CM

## 2017-05-26 DIAGNOSIS — Z950 Presence of cardiac pacemaker: Secondary | ICD-10-CM | POA: Diagnosis present

## 2017-05-26 DIAGNOSIS — B961 Klebsiella pneumoniae [K. pneumoniae] as the cause of diseases classified elsewhere: Secondary | ICD-10-CM | POA: Diagnosis present

## 2017-05-26 DIAGNOSIS — J449 Chronic obstructive pulmonary disease, unspecified: Secondary | ICD-10-CM | POA: Diagnosis present

## 2017-05-26 DIAGNOSIS — G2581 Restless legs syndrome: Secondary | ICD-10-CM | POA: Diagnosis present

## 2017-05-26 DIAGNOSIS — R338 Other retention of urine: Secondary | ICD-10-CM | POA: Diagnosis present

## 2017-05-26 DIAGNOSIS — E039 Hypothyroidism, unspecified: Secondary | ICD-10-CM | POA: Diagnosis present

## 2017-05-26 DIAGNOSIS — T83518A Infection and inflammatory reaction due to other urinary catheter, initial encounter: Principal | ICD-10-CM | POA: Diagnosis present

## 2017-05-26 DIAGNOSIS — A419 Sepsis, unspecified organism: Secondary | ICD-10-CM | POA: Diagnosis not present

## 2017-05-26 DIAGNOSIS — I5042 Chronic combined systolic (congestive) and diastolic (congestive) heart failure: Secondary | ICD-10-CM | POA: Diagnosis present

## 2017-05-26 DIAGNOSIS — G4733 Obstructive sleep apnea (adult) (pediatric): Secondary | ICD-10-CM | POA: Diagnosis present

## 2017-05-26 DIAGNOSIS — I482 Chronic atrial fibrillation: Secondary | ICD-10-CM | POA: Diagnosis present

## 2017-05-26 DIAGNOSIS — G301 Alzheimer's disease with late onset: Secondary | ICD-10-CM | POA: Diagnosis present

## 2017-05-26 DIAGNOSIS — R402411 Glasgow coma scale score 13-15, in the field [EMT or ambulance]: Secondary | ICD-10-CM | POA: Diagnosis not present

## 2017-05-26 DIAGNOSIS — R5383 Other fatigue: Secondary | ICD-10-CM | POA: Diagnosis not present

## 2017-05-26 DIAGNOSIS — Z85828 Personal history of other malignant neoplasm of skin: Secondary | ICD-10-CM

## 2017-05-26 DIAGNOSIS — I110 Hypertensive heart disease with heart failure: Secondary | ICD-10-CM | POA: Diagnosis present

## 2017-05-26 DIAGNOSIS — N320 Bladder-neck obstruction: Secondary | ICD-10-CM | POA: Diagnosis present

## 2017-05-26 DIAGNOSIS — F0280 Dementia in other diseases classified elsewhere without behavioral disturbance: Secondary | ICD-10-CM | POA: Diagnosis present

## 2017-05-26 DIAGNOSIS — R9431 Abnormal electrocardiogram [ECG] [EKG]: Secondary | ICD-10-CM | POA: Diagnosis not present

## 2017-05-26 DIAGNOSIS — Y846 Urinary catheterization as the cause of abnormal reaction of the patient, or of later complication, without mention of misadventure at the time of the procedure: Secondary | ICD-10-CM | POA: Diagnosis present

## 2017-05-26 DIAGNOSIS — R509 Fever, unspecified: Secondary | ICD-10-CM

## 2017-05-26 DIAGNOSIS — Z79899 Other long term (current) drug therapy: Secondary | ICD-10-CM | POA: Diagnosis not present

## 2017-05-26 DIAGNOSIS — R4182 Altered mental status, unspecified: Secondary | ICD-10-CM | POA: Diagnosis not present

## 2017-05-26 LAB — URINALYSIS, ROUTINE W REFLEX MICROSCOPIC
Bilirubin Urine: NEGATIVE
Glucose, UA: NEGATIVE mg/dL
Ketones, ur: NEGATIVE mg/dL
Nitrite: POSITIVE — AB
PROTEIN: NEGATIVE mg/dL
SPECIFIC GRAVITY, URINE: 1.015 (ref 1.005–1.030)
SQUAMOUS EPITHELIAL / LPF: NONE SEEN
pH: 5 (ref 5.0–8.0)

## 2017-05-26 LAB — COMPREHENSIVE METABOLIC PANEL
ALBUMIN: 3.5 g/dL (ref 3.5–5.0)
ALK PHOS: 58 U/L (ref 38–126)
ALT: 9 U/L — ABNORMAL LOW (ref 17–63)
ANION GAP: 8 (ref 5–15)
AST: 13 U/L — ABNORMAL LOW (ref 15–41)
BUN: 17 mg/dL (ref 6–20)
CALCIUM: 9 mg/dL (ref 8.9–10.3)
CO2: 25 mmol/L (ref 22–32)
Chloride: 101 mmol/L (ref 101–111)
Creatinine, Ser: 1.15 mg/dL (ref 0.61–1.24)
GFR calc non Af Amer: 56 mL/min — ABNORMAL LOW (ref >60)
GLUCOSE: 144 mg/dL — AB (ref 65–99)
POTASSIUM: 3.7 mmol/L (ref 3.5–5.1)
SODIUM: 134 mmol/L — AB (ref 135–145)
Total Bilirubin: 0.8 mg/dL (ref 0.3–1.2)
Total Protein: 7.1 g/dL (ref 6.5–8.1)

## 2017-05-26 LAB — CBC WITH DIFFERENTIAL/PLATELET
Basophils Absolute: 0 10*3/uL (ref 0.0–0.1)
Basophils Relative: 0 %
Eosinophils Absolute: 0 10*3/uL (ref 0.0–0.7)
Eosinophils Relative: 0 %
HEMATOCRIT: 36.1 % — AB (ref 39.0–52.0)
HEMOGLOBIN: 12.2 g/dL — AB (ref 13.0–17.0)
Lymphocytes Relative: 4 %
Lymphs Abs: 0.7 10*3/uL (ref 0.7–4.0)
MCH: 30.6 pg (ref 26.0–34.0)
MCHC: 33.8 g/dL (ref 30.0–36.0)
MCV: 90.5 fL (ref 78.0–100.0)
MONO ABS: 1.2 10*3/uL — AB (ref 0.1–1.0)
MONOS PCT: 7 %
NEUTROS ABS: 14.8 10*3/uL — AB (ref 1.7–7.7)
NEUTROS PCT: 89 %
Platelets: 197 10*3/uL (ref 150–400)
RBC: 3.99 MIL/uL — ABNORMAL LOW (ref 4.22–5.81)
RDW: 13 % (ref 11.5–15.5)
WBC: 16.8 10*3/uL — ABNORMAL HIGH (ref 4.0–10.5)

## 2017-05-26 LAB — I-STAT CG4 LACTIC ACID, ED: LACTIC ACID, VENOUS: 1.51 mmol/L (ref 0.5–1.9)

## 2017-05-26 MED ORDER — SODIUM CHLORIDE 0.9 % IV BOLUS (SEPSIS)
1000.0000 mL | Freq: Once | INTRAVENOUS | Status: AC
  Administered 2017-05-26: 1000 mL via INTRAVENOUS

## 2017-05-26 MED ORDER — APIXABAN 5 MG PO TABS
5.0000 mg | ORAL_TABLET | Freq: Two times a day (BID) | ORAL | Status: DC
  Administered 2017-05-27 – 2017-05-29 (×5): 5 mg via ORAL
  Filled 2017-05-26 (×5): qty 1

## 2017-05-26 MED ORDER — ONDANSETRON HCL 4 MG/2ML IJ SOLN: 4.0000 mg | Freq: Four times a day (QID) | INTRAMUSCULAR | Status: DC | PRN

## 2017-05-26 MED ORDER — ACETAMINOPHEN 650 MG RE SUPP
650.0000 mg | Freq: Once | RECTAL | Status: AC
  Administered 2017-05-26: 650 mg via RECTAL
  Filled 2017-05-26: qty 1

## 2017-05-26 MED ORDER — AMIODARONE HCL 100 MG PO TABS
100.0000 mg | ORAL_TABLET | Freq: Two times a day (BID) | ORAL | Status: DC
  Administered 2017-05-27 – 2017-05-29 (×4): 100 mg via ORAL
  Filled 2017-05-26 (×6): qty 1

## 2017-05-26 MED ORDER — ACETAMINOPHEN 325 MG PO TABS: 650.0000 mg | ORAL_TABLET | Freq: Four times a day (QID) | ORAL | Status: DC | PRN

## 2017-05-26 MED ORDER — MEMANTINE HCL ER 28 MG PO CP24
28.0000 mg | ORAL_CAPSULE | Freq: Every day | ORAL | Status: DC
  Administered 2017-05-27 – 2017-05-29 (×3): 28 mg via ORAL
  Filled 2017-05-26 (×3): qty 1

## 2017-05-26 MED ORDER — ONDANSETRON HCL 4 MG PO TABS: 4.0000 mg | ORAL_TABLET | Freq: Four times a day (QID) | ORAL | Status: DC | PRN

## 2017-05-26 MED ORDER — LEVOTHYROXINE SODIUM 50 MCG PO TABS
50.0000 ug | ORAL_TABLET | Freq: Every day | ORAL | Status: DC
  Administered 2017-05-28 – 2017-05-29 (×2): 50 ug via ORAL
  Filled 2017-05-26 (×2): qty 1

## 2017-05-26 MED ORDER — ALBUTEROL SULFATE (2.5 MG/3ML) 0.083% IN NEBU: 2.5000 mg | INHALATION_SOLUTION | RESPIRATORY_TRACT | Status: DC | PRN

## 2017-05-26 MED ORDER — METOPROLOL SUCCINATE ER 25 MG PO TB24
25.0000 mg | ORAL_TABLET | Freq: Every day | ORAL | Status: DC
  Administered 2017-05-27: 25 mg via ORAL
  Filled 2017-05-26 (×3): qty 1

## 2017-05-26 MED ORDER — VANCOMYCIN HCL IN DEXTROSE 1-5 GM/200ML-% IV SOLN
1000.0000 mg | Freq: Once | INTRAVENOUS | Status: AC
  Administered 2017-05-26: 1000 mg via INTRAVENOUS
  Filled 2017-05-26: qty 200

## 2017-05-26 MED ORDER — PIPERACILLIN-TAZOBACTAM 3.375 G IVPB 30 MIN
3.3750 g | Freq: Once | INTRAVENOUS | Status: AC
  Administered 2017-05-26: 3.375 g via INTRAVENOUS
  Filled 2017-05-26: qty 50

## 2017-05-26 MED ORDER — ACETAMINOPHEN 650 MG RE SUPP: 650.0000 mg | Freq: Four times a day (QID) | RECTAL | Status: DC | PRN

## 2017-05-26 MED ORDER — SODIUM CHLORIDE 0.9 % IV SOLN
INTRAVENOUS | Status: DC
  Administered 2017-05-26: via INTRAVENOUS

## 2017-05-26 MED ORDER — TAMSULOSIN HCL 0.4 MG PO CAPS
0.4000 mg | ORAL_CAPSULE | Freq: Two times a day (BID) | ORAL | Status: DC
  Administered 2017-05-27 – 2017-05-29 (×5): 0.4 mg via ORAL
  Filled 2017-05-26 (×5): qty 1

## 2017-05-26 MED ORDER — DIPHENHYDRAMINE HCL 50 MG/ML IJ SOLN
25.0000 mg | Freq: Once | INTRAMUSCULAR | Status: AC
  Administered 2017-05-27: 25 mg via INTRAVENOUS
  Filled 2017-05-26: qty 1

## 2017-05-26 MED ORDER — DEXTROSE 5 % IV SOLN
2.0000 g | Freq: Every day | INTRAVENOUS | Status: DC
  Administered 2017-05-27 – 2017-05-29 (×3): 2 g via INTRAVENOUS
  Filled 2017-05-26 (×3): qty 2

## 2017-05-26 NOTE — ED Notes (Addendum)
CALLED CODE SEPSIS #[phone]  DR Altamese Dilling

## 2017-05-26 NOTE — H&P (Signed)
History and Physical    Paul Davies NOM:[id number] DOB: 1930-06-24 DOA: 05/26/2017  Referring MD/NP/PA: Dr. Theotis Burrow PCP: Unk Pinto, MD  Patient coming from: [place] via EMS  Chief Complaint: Altered  HPI: Paul Davies is a 83 y.o. male with medical history significant of HTN, HLD, hypothyroidism, A. Fib, s/p PM, history of dementia, and BPH with Foley in place; who presents after being found to be altered by the family. History is obtained by the patient's wife and daughter were present at bedside. They report that he was trying to cook with a pan on the stove and was more agitated than normal. While at his daughter's house patient was noted to be extremely warm . They checked his axillary temperature and it was noted to be 102F. The patient has been having issues with urinary retention for the last 3 weeks. They had placed a Foley catheter and he had been being followed by Dr. Risa Grill of [facility]. The Foley catheter was just changed out approximately 5 days ago. Other associated symptoms include chronic cough that is unchanged. Family denies any complaints of chest pain, falls, or shortness of breath.   ED Course: Upon admission into the emergency department patient was seen to be febrile abdominal 102.31F, and all other vital signs relatively within normal limits. Labs revealed WBC 16.8, hemoglobin 12.1, sodium 134, BUN 17, creatinine 1.15, and all other labs within normal limits. Urinalysis was positive for signs of infection. Due to his initial presenting symptoms patient was empirically treated with vancomycin and Zosyn and given 1 L normal saline IV fluids.  Review of Systems: Review of Systems  Unable to perform ROS: Dementia  Constitutional: Positive for chills and fever.  HENT: Negative for ear discharge and nosebleeds.   Eyes: Negative for photophobia.  Respiratory: Positive for cough (Chronic). Negative for shortness of breath.   Cardiovascular:  Negative for palpitations and orthopnea.  Gastrointestinal: Negative for abdominal pain, diarrhea, nausea and vomiting.  Genitourinary: Negative for frequency and hematuria.       Positive for Foley catheter  Musculoskeletal: Negative for falls.  Skin: Negative for itching and rash.  Neurological: Negative for speech change and focal weakness.  Endo/Heme/Allergies: Negative for environmental allergies. Does not bruise/bleed easily.  Psychiatric/Behavioral: Positive for memory loss.    Past Medical History:  Diagnosis Date  . Atherosclerosis of aorta ([place]) 2010   noted on lumbar CT in 2010  . Atrial fibrillation ([place])   . Basal cell carcinoma, face   . Chronic sinusitis   . COPD (chronic obstructive pulmonary disease) ([place])    On CXR  . DJD (degenerative joint disease)   . History of right MCA stroke ~ 2002; 2006  . Hyperlipidemia   . Hypertension   . Hypothyroidism   . Hypothyroidism   . OSA on CPAP   . Osteopenia   . Pacemaker medtronic   . Pneumonia 01/28/2016  . Prediabetes   . RLS (restless legs syndrome)   . Sepsis ([place]) 04/14/2016  . Sinoatrial node dysfunction (HCC)   . Vitamin D deficiency     Past Surgical History:  Procedure Laterality Date  . BASAL CELL CARCINOMA EXCISION     "face"  . CARDIAC CATHETERIZATION  02/2004   normal coronary arteries  . INSERT / REPLACE / REMOVE PACEMAKER  04/2005  . PACEMAKER GENERATOR CHANGE N/A 05/04/2014   Procedure: PACEMAKER GENERATOR CHANGE;  Surgeon: [name], MD;  Location: [facility] CATH LAB;  Service: Cardiovascular;  Laterality:  N/A;  . SHOULDER OPEN ROTATOR CUFF REPAIR Right 02/2008  . TRANSURETHRAL RESECTION OF PROSTATE       reports that he is a non-smoker but has been exposed to tobacco smoke. He has never used smokeless tobacco. He reports that he does not drink alcohol or use drugs.  Allergies  Allergen Reactions  . Bee Venom Anaphylaxis    Family History  Problem Relation Age of Onset  . Heart attack Father     . Stroke Sister   . Dementia Sister   . Heart attack Brother 60    Prior to Admission medications   Medication Sig Start Date End Date Taking? Authorizing Provider  amiodarone (PACERONE) 200 MG tablet Take 100 mg by mouth 2 (two) times daily. Taking half tablet daily    [provider]  apixaban (ELIQUIS) 5 MG TABS tablet Take 5 mg by mouth 2 (two) times daily.    [provider]  cholecalciferol (VITAMIN D) 1000 UNITS tablet Take 2,000 Units by mouth daily.     [provider]  hydrocortisone 2.5 % cream Apply 1 application topically daily as needed (inflammation on face). To face    [provider]  metoprolol succinate (TOPROL-XL) 25 MG 24 hr tablet Take 25 mg by mouth daily. 05/06/17   [provider]  NAMENDA XR 28 MG CP24 24 hr capsule Take 1 capsule (28 mg total) by mouth daily. 03/16/17   Dennie Bible, NP  QUEtiapine (SEROQUEL) 25 MG tablet Take 1 tablet 3 times a day PRN for agitation. 05/06/17   Unk Pinto, MD  SYNTHROID 50 MCG tablet TAKE 1 TABLET DAILY BEFORE BREAKFAST 12/23/16   Vicie Mutters, PA-C  tamsulosin (FLOMAX) 0.4 MG CAPS capsule Take 1 capsule (0.4 mg total) by mouth daily after supper. 05/06/17   Unk Pinto, MD    Physical Exam:  Constitutional: Elderly male who appears restless Vitals:   05/26/17 2137 05/26/17 2159 05/26/17 2231  BP: 130/90  134/64  Pulse: 73  81  Resp: 12  19  Temp: (!) 102.7 F (39.3 C)    TempSrc: Rectal    SpO2: 96%  95%  Weight:  79.4 kg (175 lb)    Eyes: PERRL, lids and conjunctivae normal ENMT: Mucous membranes are dry. Posterior pharynx clear of any exudate or lesions.  Neck: normal, supple, no masses, no thyromegaly Respiratory: clear to auscultation bilaterally, no wheezing, no crackles. Normal respiratory effort. No accessory muscle use.  Cardiovascular: Regular rate and rhythm, no murmurs / rubs / gallops. No extremity edema. 2+ pedal pulses. No carotid bruits.   Abdomen: no tenderness, no masses palpated. No hepatosplenomegaly. Bowel sounds positive.  Musculoskeletal: no clubbing / cyanosis. No joint deformity upper and lower extremities. Good ROM, no contractures. Normal muscle tone.  Skin: no rashes, lesions, ulcers. No induration Neurologic: CN 2-12 grossly intact. Sensation intact, DTR normal. Strength 5/5 in all 4.  Psychiatric: Dementia. Alert and oriented x 1 agitated mood    Labs on Admission: I have personally reviewed following labs and imaging studies  CBC:  Recent Labs Lab 05/26/17 2153  WBC 16.8*  NEUTROABS 14.8*  HGB 12.2*  HCT 36.1*  MCV 90.5  PLT 932   Basic Metabolic Panel:  Recent Labs Lab 05/26/17 2153  NA 134*  K 3.7  CL 101  CO2 25  GLUCOSE 144*  BUN 17  CREATININE 1.15  CALCIUM 9.0   GFR: Estimated Creatinine Clearance: 47.4 mL/min (by C-G formula based on SCr of 1.15  mg/dL). Liver Function Tests:  Recent Labs Lab 05/26/17 2153  AST 13*  ALT 9*  ALKPHOS 58  BILITOT 0.8  PROT 7.1  ALBUMIN 3.5   No results for input(s): LIPASE, AMYLASE in the last 168 hours. No results for input(s): AMMONIA in the last 168 hours. Coagulation Profile: No results for input(s): INR, PROTIME in the last 168 hours. Cardiac Enzymes: No results for input(s): CKTOTAL, CKMB, CKMBINDEX, TROPONINI in the last 168 hours. BNP (last 3 results) No results for input(s): PROBNP in the last 8760 hours. HbA1C: No results for input(s): HGBA1C in the last 72 hours. CBG: No results for input(s): GLUCAP in the last 168 hours. Lipid Profile: No results for input(s): CHOL, HDL, LDLCALC, TRIG, CHOLHDL, LDLDIRECT in the last 72 hours. Thyroid Function Tests: No results for input(s): TSH, T4TOTAL, FREET4, T3FREE, THYROIDAB in the last 72 hours. Anemia Panel: No results for input(s): VITAMINB12, FOLATE, FERRITIN, TIBC, IRON, RETICCTPCT in the last 72 hours. Urine analysis:    Component Value Date/Time   COLORURINE YELLOW  05/26/2017 2153   APPEARANCEUR CLEAR 05/26/2017 2153   LABSPEC 1.015 05/26/2017 2153   PHURINE 5.0 05/26/2017 2153   GLUCOSEU NEGATIVE 05/26/2017 2153   HGBUR MODERATE (A) 05/26/2017 2153   BILIRUBINUR NEGATIVE 05/26/2017 2153   [place] 05/26/2017 2153   PROTEINUR NEGATIVE 05/26/2017 2153   UROBILINOGEN 0.2 12/19/2014 1136   NITRITE POSITIVE (A) 05/26/2017 2153   LEUKOCYTESUR LARGE (A) 05/26/2017 2153   Sepsis Labs: No results found for this or any previous visit (from the past 240 hour(s)).   Radiological Exams on Admission: Dg Chest Port 1 View  Result Date: 05/26/2017 CLINICAL DATA:  Fever and altered mental status EXAM: PORTABLE CHEST 1 VIEW COMPARISON:  Chest radiograph 02/25/2017 FINDINGS: Unchanged position of left chest wall pacemaker leads. Unchanged cardiomediastinal contours. No focal airspace consolidation or pulmonary edema. No pleural effusion or pneumothorax. Healed right sixth rib fracture. IMPRESSION: No active disease. Electronically Signed   By: Ulyses Jarred M.D.   On: 05/26/2017 22:19    EKG: Independently reviewed. Sinus rhythm 67 bpm  Assessment/Plan Sepsis secondary to urinary tract infection:Acute. Patient presents with fever up to 102.43F and WBC elevated at 16.8. Lactate acid was reassuring at 1.51, but urinalysis was positive for significant signs of infection. Patient was empirically given vancomycin and Zosyn initially. Foley catheter ordered to be changed out last changed 5 days ago. - Admit to a MedSurg bed - Follow-up blood and urine cultures - Continue empiric antibiotics of Rocephin as no previous history of resistant bacteria noted - Tylenol prn fever  Acute encephalopathy: Secondary to the above. - Continue to monitor  Essential hypertension - Continue metoprolol  Atrial fibrillation, SSS s/p PM, on chronic anticoagulant - Continue Eliquis and amiodarone   Chronic combined systolic and diastolic CHF: Last EF 10-93% in 01/2016.  -  Strict I&Os, daily weights  Dementia - Continue Namenda  BPH with urinary retention - Continue Flomax   DVT prophylaxis: Eliquis Code Status: Full Family Communication:  Discussed plan of care with patient and family present at bedside Disposition Plan: Discharge home once medically stable Consults called: none  Admission status: Inpatient  Norval Morton MD [facility] Pager [phone]  If 7PM-7AM, please contact night-coverage [email or web address] Password [email or web address]  05/26/2017, 11:00 PM

## 2017-05-26 NOTE — ED Notes (Signed)
Unable to get second set of blood cultures before starting abx.  First set already received from [facility].

## 2017-05-26 NOTE — ED Provider Notes (Signed)
[facility] DEPT Provider Note   CSN: [id number] Arrival date & time: 05/26/17  2122     History   Chief Complaint Chief Complaint  Patient presents with  . Altered Mental Status    HPI Paul Davies is a 83 y.o. male.  83 year old male with past medical history including atrial fibrillation, COPD, dementia, pacemaker who presents with altered mental status. Daughter reports that the patient has been altered from his baseline and lethargic today. They noted a fever at home. He recently had problems with urinary retention and constipation and had a Foley catheter placed several weeks ago. He has had several trials avoid that have failed, his catheter was most recently replaced just under a week ago. He has a chronic cough that is unchanged recently. He has not had any vomiting or diarrhea. He denies any pain but states that he is cold.  LEVEL 5 CAVEAT DUE TO DEMENTIA   The history is provided by a relative.    Past Medical History:  Diagnosis Date  . Atherosclerosis of aorta ([place]) 2010   noted on lumbar CT in 2010  . Atrial fibrillation ([place])   . Basal cell carcinoma, face   . Chronic sinusitis   . COPD (chronic obstructive pulmonary disease) ([place])    On CXR  . DJD (degenerative joint disease)   . History of right MCA stroke ~ 2002; 2006  . Hyperlipidemia   . Hypertension   . Hypothyroidism   . Hypothyroidism   . OSA on CPAP   . Osteopenia   . Pacemaker medtronic   . Pneumonia 01/28/2016  . Prediabetes   . RLS (restless legs syndrome)   . Sepsis ([place]) 04/14/2016  . Sinoatrial node dysfunction (HCC)   . Vitamin D deficiency     Patient Active Problem List   Diagnosis Date Noted  . Falls frequently 03/16/2017  . Medication management 04/22/2016  . Prediabetes 04/22/2016  . Hypothyroidism 04/14/2016  . Chronic combined systolic and diastolic congestive heart failure ([place]) 01/30/2016  . Atherosclerosis of aorta ([place])   . Sick sinus syndrome ([place]) 12/19/2014    . COPD (chronic obstructive pulmonary disease) ([place]) 12/19/2014  . RLS (restless legs syndrome) 12/19/2014  . Cardiac pacemaker in situ   . Essential hypertension 03/19/2014  . Current use of long term anticoagulation   . OSA and COPD overlap syndrome ([place])   . SDAT (senile dementia of Alzheimer's type) 03/06/2014  . Hyperlipidemia   . Vitamin D deficiency   . Abnormal blood sugar   . History of stroke   . Paroxysmal atrial fibrillation (HCC)   . BPH 12/20/2008    Past Surgical History:  Procedure Laterality Date  . BASAL CELL CARCINOMA EXCISION     "face"  . CARDIAC CATHETERIZATION  02/2004   normal coronary arteries  . INSERT / REPLACE / REMOVE PACEMAKER  04/2005  . PACEMAKER GENERATOR CHANGE N/A 05/04/2014   Procedure: PACEMAKER GENERATOR CHANGE;  Surgeon: [name], MD;  Location: [facility] CATH LAB;  Service: Cardiovascular;  Laterality: N/A;  . SHOULDER OPEN ROTATOR CUFF REPAIR Right 02/2008  . TRANSURETHRAL RESECTION OF PROSTATE         Home Medications    Prior to Admission medications   Medication Sig Start Date End Date Taking? Authorizing Provider  amiodarone (PACERONE) 200 MG tablet Take 100 mg by mouth 2 (two) times daily. Taking half tablet daily    [provider]  apixaban (ELIQUIS) 5 MG TABS tablet Take 5 mg by  mouth 2 (two) times daily.    [provider]  cholecalciferol (VITAMIN D) 1000 UNITS tablet Take 2,000 Units by mouth daily.     [provider]  hydrocortisone 2.5 % cream Apply 1 application topically daily as needed (inflammation on face). To face    [provider]  metoprolol succinate (TOPROL-XL) 25 MG 24 hr tablet Take 25 mg by mouth daily. 05/06/17   [provider]  NAMENDA XR 28 MG CP24 24 hr capsule Take 1 capsule (28 mg total) by mouth daily. 03/16/17   Dennie Bible, NP  QUEtiapine (SEROQUEL) 25 MG tablet Take 1 tablet 3 times a day PRN for agitation. 05/06/17   Unk Pinto, MD   SYNTHROID 50 MCG tablet TAKE 1 TABLET DAILY BEFORE BREAKFAST 12/23/16   Vicie Mutters, PA-C  tamsulosin (FLOMAX) 0.4 MG CAPS capsule Take 1 capsule (0.4 mg total) by mouth daily after supper. 05/06/17   Unk Pinto, MD    Family History Family History  Problem Relation Age of Onset  . Heart attack Father   . Stroke Sister   . Dementia Sister   . Heart attack Brother 26    Social History Social History  Substance Use Topics  . Smoking status: Passive Smoke Exposure - Never Smoker  . Smokeless tobacco: Never Used  . Alcohol use No     Allergies   Bee venom   Review of Systems Review of Systems  Unable to perform ROS: Dementia     Physical Exam Updated Vital Signs BP 134/64   Pulse 81   Temp (!) 102.7 F (39.3 C) (Rectal)   Resp 19   Wt 79.4 kg (175 lb)   SpO2 95%   BMI 27.41 kg/m   Physical Exam  Constitutional: He appears well-developed. No distress.  Frail elderly man awake and alert  HENT:  Head: Normocephalic and atraumatic.  dry mucous membranes  Eyes: Conjunctivae are normal. Pupils are equal, round, and reactive to light.  Neck: Neck supple.  Cardiovascular: Normal rate, regular rhythm and normal heart sounds.   No murmur heard. Pulmonary/Chest: Effort normal.  Rhonchi b/l  Abdominal: Soft. Bowel sounds are normal. He exhibits no distension. There is no tenderness.  Musculoskeletal: He exhibits no edema.  Neurological: He is alert.  Fluent speech, oriented to person, following commands  Skin: Skin is warm and dry.  Nursing note and vitals reviewed.    ED Treatments / Results  Labs (all labs ordered are listed, but only abnormal results are displayed) Labs Reviewed  COMPREHENSIVE METABOLIC PANEL - Abnormal; Notable for the following:       Result Value   Sodium 134 (*)    Glucose, Bld 144 (*)    AST 13 (*)    ALT 9 (*)    GFR calc non Af Amer 56 (*)    All other components within normal limits  CBC WITH DIFFERENTIAL/PLATELET -  Abnormal; Notable for the following:    WBC 16.8 (*)    RBC 3.99 (*)    Hemoglobin 12.2 (*)    HCT 36.1 (*)    Neutro Abs 14.8 (*)    Monocytes Absolute 1.2 (*)    All other components within normal limits  URINALYSIS, ROUTINE W REFLEX MICROSCOPIC - Abnormal; Notable for the following:    Hgb urine dipstick MODERATE (*)    Nitrite POSITIVE (*)    Leukocytes, UA LARGE (*)    Bacteria, UA RARE (*)    All other components within  normal limits  CULTURE, BLOOD (ROUTINE X 2)  CULTURE, BLOOD (ROUTINE X 2)  URINE CULTURE  I-STAT CG4 LACTIC ACID, ED    EKG  EKG Interpretation  Date/Time:  Wednesday May 26 2017 21:52:27 EDT Ventricular Rate:  67 PR Interval:    QRS Duration: 99 QT Interval:  469 QTC Calculation: 496 R Axis:   11 Text Interpretation:  Sinus rhythm Borderline T abnormalities, inferior leads Borderline prolonged QT interval No significant change since last tracing Confirmed by Theotis Burrow [phone]) on 05/26/2017 10:07:17 PM       Radiology Dg Chest Port 1 View  Result Date: 05/26/2017 CLINICAL DATA:  Fever and altered mental status EXAM: PORTABLE CHEST 1 VIEW COMPARISON:  Chest radiograph 02/25/2017 FINDINGS: Unchanged position of left chest wall pacemaker leads. Unchanged cardiomediastinal contours. No focal airspace consolidation or pulmonary edema. No pleural effusion or pneumothorax. Healed right sixth rib fracture. IMPRESSION: No active disease. Electronically Signed   By: Ulyses Jarred M.D.   On: 05/26/2017 22:19    Procedures .Critical Care Performed by: Sharlett Iles Authorized by: Sharlett Iles   Critical care provider statement:    Critical care time (minutes):  30   Critical care time was exclusive of:  Separately billable procedures and treating other patients   Critical care was necessary to treat or prevent imminent or life-threatening deterioration of the following conditions:  Sepsis   Critical care was time spent personally by me  on the following activities:  Development of treatment plan with patient or surrogate, evaluation of patient's response to treatment, examination of patient, obtaining history from patient or surrogate, ordering and performing treatments and interventions, ordering and review of laboratory studies, ordering and review of radiographic studies and re-evaluation of patient's condition   (including critical care time)  Medications Ordered in ED Medications  vancomycin (VANCOCIN) IVPB 1000 mg/200 mL premix (1,000 mg Intravenous New Bag/Given 05/26/17 2241)  piperacillin-tazobactam (ZOSYN) IVPB 3.375 g (0 g Intravenous Stopped 05/26/17 2241)  acetaminophen (TYLENOL) suppository 650 mg (650 mg Rectal Given 05/26/17 2205)  sodium chloride 0.9 % bolus 1,000 mL (1,000 mLs Intravenous New Bag/Given 05/26/17 2205)     Initial Impression / Assessment and Plan / ED Course  I have reviewed the triage vital signs and the nursing notes.  Pertinent labs & imaging results that were available during my care of the patient were reviewed by me and considered in my medical decision making (see chart for details).    PT w/ AMS and lethargy noted by family today. Fever of 102.7 on arrival, Normal BP and heart rate. He denied any complaints of pain. Initiated a code sepsis with blood and urine cultures, vancomycin and Zosyn given unclear source. Gave IV fluids and Tylenol.  Initial lactate normal. WBC 16.8, UA c/w infection. Creatinine normal, CXR negative acute. I have placed an order to exchange the patient's Foley. Discussed admission with [facility] hospitalist, Dr. Tamala Julian, and patient admitted for further treatment.  Final Clinical Impressions(s) / ED Diagnoses   Final diagnoses:  Altered mental status, unspecified altered mental status type  Urinary tract infection without hematuria, site unspecified  Fever, unspecified fever cause    New Prescriptions New Prescriptions   No medications on file     Lasonja Lakins, Wenda Overland, MD 05/26/17 2306

## 2017-05-26 NOTE — ED Triage Notes (Signed)
Pt comes from his daughter's house via EMS with complaints of lethargy and altered mental status.  Pt's daughter states she got an axillary temp of 102. Hx of dementia.  Foley cath in place on arrival. Vitals WNL.  Pt has pacemaker. CBG 131 in route.

## 2017-05-26 NOTE — ED Notes (Signed)
Bed: VH06 Expected date:  Expected time:  Means of arrival:  Comments: 83 yo M  Lethargic

## 2017-05-27 DIAGNOSIS — R509 Fever, unspecified: Secondary | ICD-10-CM

## 2017-05-27 LAB — BASIC METABOLIC PANEL
Anion gap: 7 (ref 5–15)
BUN: 15 mg/dL (ref 6–20)
CALCIUM: 8.4 mg/dL — AB (ref 8.9–10.3)
CO2: 24 mmol/L (ref 22–32)
Chloride: 104 mmol/L (ref 101–111)
Creatinine, Ser: 1.13 mg/dL (ref 0.61–1.24)
GFR calc Af Amer: >60 mL/min (ref >60)
GFR, EST NON AFRICAN AMERICAN: 57 mL/min — AB (ref >60)
GLUCOSE: 133 mg/dL — AB (ref 65–99)
Potassium: 3.7 mmol/L (ref 3.5–5.1)
Sodium: 135 mmol/L (ref 135–145)

## 2017-05-27 LAB — CBC
HCT: 33.5 % — ABNORMAL LOW (ref 39.0–52.0)
Hemoglobin: 11.2 g/dL — ABNORMAL LOW (ref 13.0–17.0)
MCH: 30.4 pg (ref 26.0–34.0)
MCHC: 33.4 g/dL (ref 30.0–36.0)
MCV: 90.8 fL (ref 78.0–100.0)
PLATELETS: 181 10*3/uL (ref 150–400)
RBC: 3.69 MIL/uL — AB (ref 4.22–5.81)
RDW: 13.1 % (ref 11.5–15.5)
WBC: 18.4 10*3/uL — AB (ref 4.0–10.5)

## 2017-05-27 LAB — PROCALCITONIN: Procalcitonin: 9.07 ng/mL

## 2017-05-27 NOTE — Progress Notes (Signed)
PHARMACY NOTE -  ANTIBIOTIC RENAL DOSE ADJUSTMENT   Request received for Pharmacy to assist with antibiotic renal dose adjustment.  Patient has been initiated on Ceftriaxone 2gm iv q24hr for UTI. SCr 1.13, estimated CrCl 43 ml/min Current dosage is appropriate and need for further dosage adjustment appears unlikely at present. Will sign off at this time.  Please reconsult if a change in clinical status warrants re-evaluation of dosage.

## 2017-05-27 NOTE — Progress Notes (Signed)
[facility] PROGRESS NOTE    Progress Note  BROADUS COSTILLA  BJS:[id number] DOB: 20-Feb-1930 DOA: 05/26/2017 PCP: Unk Pinto, MD     Brief Narrative:   Paul Davies is an 83 y.o. male past medical history of essential hypertension, chronic atrial fibrillation dementia with an indwelling Foley catheter due to BPH presents for altered mental status most of the history was obtained from the daughter is a patient cannot provide history. Was found to have a temperature of 102 in the ED Foley was exchanged by his urologist 5 days prior to admission. In the ED he was found to have a fever 102 with a white count of 16 with a left shift so we're consulted for evaluation.  Assessment/Plan:   Sepsis secondary to UTI [facility]): Continues to spike fevers, he was started on IV Rocephin on admission he has no previous history of resistant bacteria's. He continues to have a persistence leukocytosis, vitals are stable. Lactic acid was less than 2, pro calcitonin was 9.0 Culture data is pending.  Acute encephalopathy: Likely due to infectious etiology.  Essential hypertension: No changes made to his medication.  Chronic atrial fibrillation due to sick sinus syndrome, status post pacemaker: Continue Eluquis and amiodarone.  Chronic combined systolic and diastolic heart failure with an EF of 45% on 3 2017. Seems to be euvolemic continue strict I's and O's daily weights.  Advanced dementia: Continue Namenda.  BPH Continue Flomax.   DVT prophylaxis: Eluquis Family Communication:None Disposition Plan/Barrier to D/C: home in 1-2 days Code Status:     Code Status Orders        Start     Ordered   05/26/17 2312  Full code  Continuous     05/26/17 2313    Code Status History    Date Active Date Inactive Code Status Order ID Comments User Context   04/14/2016  7:14 AM 04/16/2016  5:19 PM Full Code [id number]  Lily Kocher, MD Inpatient   01/28/2016  6:52 AM 01/31/2016 12:30 PM Full  Code [id number]  Minus Breeding, MD ED   05/04/2014  5:22 PM 05/04/2014 10:37 PM Full Code [id number]  [name], MD Inpatient    Advance Directive Documentation     Most Recent Value  Type of Advance Directive  Healthcare Power of [place], Living will  Pre-existing out of facility DNR order (yellow form or pink MOST form)  -  "MOST" Form in Place?  -        IV Access:    Peripheral IV   Procedures and diagnostic studies:   Dg Chest Port 1 View  Result Date: 05/26/2017 CLINICAL DATA:  Fever and altered mental status EXAM: PORTABLE CHEST 1 VIEW COMPARISON:  Chest radiograph 02/25/2017 FINDINGS: Unchanged position of left chest wall pacemaker leads. Unchanged cardiomediastinal contours. No focal airspace consolidation or pulmonary edema. No pleural effusion or pneumothorax. Healed right sixth rib fracture. IMPRESSION: No active disease. Electronically Signed   By: Ulyses Jarred M.D.   On: 05/26/2017 22:19     Medical Consultants:    None.  Anti-Infectives:   Rocephin  Subjective:    Archie Balboa [place] she relates she feels better compared to yesterday. Her suprapubic pain is resolved.  Objective:    Vitals:   05/26/17 2319 05/26/17 2339 05/27/17 0115 05/27/17 0645  BP: (!) 109/58   (!) 93/56  Pulse: 66   61  Resp: 19   20  Temp:  98.6 F (37 C)  99.1 F (37.3 C)  TempSrc:  Oral  Oral  SpO2: 95%   97%  Weight:   74.8 kg (165 lb)   Height:   5\' 6"  (1.676 m)     Intake/Output Summary (Last 24 hours) at 05/27/17 0755 Last data filed at 05/27/17 0536  Gross per 24 hour  Intake             1425 ml  Output              600 ml  Net              825 ml   Filed Weights   05/26/17 2159 05/27/17 0115  Weight: 79.4 kg (175 lb) 74.8 kg (165 lb)    Exam: General exam: In no acute distress Respiratory system: Good air movement clear to auscultation. Cardiovascular system: Regular rate and rhythm with positive S1-S2 no murmurs or gallops Gastrointestinal  system: Abdomen is soft, nontender nondistended Central nervous system: Nonfocal, alert and oriented Extremities: No lower extremity edema Skin: No rashes Psychiatry: Wichman and insight appear normal.   Data Reviewed:    Labs: Basic Metabolic Panel:  Recent Labs Lab 05/26/17 2153 05/27/17 0059  NA 134* 135  K 3.7 3.7  CL 101 104  CO2 25 24  GLUCOSE 144* 133*  BUN 17 15  CREATININE 1.15 1.13  CALCIUM 9.0 8.4*   GFR Estimated Creatinine Clearance: 43.1 mL/min (by C-G formula based on SCr of 1.13 mg/dL). Liver Function Tests:  Recent Labs Lab 05/26/17 2153  AST 13*  ALT 9*  ALKPHOS 58  BILITOT 0.8  PROT 7.1  ALBUMIN 3.5   No results for input(s): LIPASE, AMYLASE in the last 168 hours. No results for input(s): AMMONIA in the last 168 hours. Coagulation profile No results for input(s): INR, PROTIME in the last 168 hours.  CBC:  Recent Labs Lab 05/26/17 2153 05/27/17 0059  WBC 16.8* 18.4*  NEUTROABS 14.8*  --   HGB 12.2* 11.2*  HCT 36.1* 33.5*  MCV 90.5 90.8  PLT 197 181   Cardiac Enzymes: No results for input(s): CKTOTAL, CKMB, CKMBINDEX, TROPONINI in the last 168 hours. BNP (last 3 results) No results for input(s): PROBNP in the last 8760 hours. CBG: No results for input(s): GLUCAP in the last 168 hours. D-Dimer: No results for input(s): DDIMER in the last 72 hours. Hgb A1c: No results for input(s): HGBA1C in the last 72 hours. Lipid Profile: No results for input(s): CHOL, HDL, LDLCALC, TRIG, CHOLHDL, LDLDIRECT in the last 72 hours. Thyroid function studies: No results for input(s): TSH, T4TOTAL, T3FREE, THYROIDAB in the last 72 hours.  Invalid input(s): FREET3 Anemia work up: No results for input(s): VITAMINB12, FOLATE, FERRITIN, TIBC, IRON, RETICCTPCT in the last 72 hours. Sepsis Labs:  Recent Labs Lab 05/26/17 2149 05/26/17 2153 05/27/17 0059  PROCALCITON  --   --  9.07  WBC  --  16.8* 18.4*  LATICACIDVEN 1.51  --   --     Microbiology No results found for this or any previous visit (from the past 240 hour(s)).   Medications:   . amiodarone  100 mg Oral BID  . apixaban  5 mg Oral BID  . levothyroxine  50 mcg Oral QAC breakfast  . memantine  28 mg Oral Daily  . metoprolol succinate  25 mg Oral Daily  . tamsulosin  0.4 mg Oral BID   Continuous Infusions: . sodium chloride 75 mL/hr at 05/26/17 2333  . cefTRIAXone (ROCEPHIN)  IV Stopped (05/27/17 0536)  LOS: 1 day   Charlynne Cousins  [facility] Pager [phone]  *Please refer to [email or web address], password [email or web address] to get updated schedule on who will round on this patient, as hospitalists switch teams weekly. If 7PM-7AM, please contact night-coverage at [email or web address], password [email or web address] for any overnight needs.  05/27/2017, 7:55 AM

## 2017-05-28 MED ORDER — SODIUM CHLORIDE 0.9 % IV BOLUS (SEPSIS)
500.0000 mL | Freq: Once | INTRAVENOUS | Status: AC
  Administered 2017-05-28: 500 mL via INTRAVENOUS

## 2017-05-28 MED ORDER — QUETIAPINE FUMARATE 25 MG PO TABS: 25.0000 mg | ORAL_TABLET | Freq: Three times a day (TID) | ORAL | Status: DC | PRN

## 2017-05-28 NOTE — Evaluation (Signed)
Physical Therapy Evaluation Patient Details Name: Paul Davies MRN: [id number] DOB: 02-10-1930 Today's Date: 05/28/2017   History of Present Illness  83 yo male admitted with sepsis. Hx of dementia, A fib, COPD, pacemaker, CVA, hypothyroidism, HTN  Clinical Impression  On eval, pt required Min assist for mobility. He walked ~350 feet in hallway. Pt is unsteady and at risk for falls. He tolerated distance well. Discussed d/c plan-pt will return home after hospital stay. Educated pt and wife on use of RW for ambulation safety. Wife reports pt has been noncompliant with RW use since last admission. Recommend HHPT for general strength and balance training and RW use for ambulation safety.     Follow Up Recommendations Home health PT;Supervision/Assistance - 24 hour    Equipment Recommendations  None recommended by PT (wife reports they have plenty of walkers at home)    Recommendations for Other Services       Precautions / Restrictions Precautions Precautions: Fall Precaution Comments: Multiple falls per wife Restrictions Weight Bearing Restrictions: No      Mobility  Bed Mobility Overal bed mobility: Needs Assistance Bed Mobility: Supine to Sit;Sit to Supine     Supine to sit: Supervision;HOB elevated Sit to supine: Supervision;HOB elevated   General bed mobility comments: for safety  Transfers Overall transfer level: Needs assistance   Transfers: Sit to/from Stand Sit to Stand: Min assist         General transfer comment: Assist to rise, stabilize, control descent. Pt is unsteady.   Ambulation/Gait Ambulation/Gait assistance: Min assist Ambulation Distance (Feet): 350 Feet Assistive device: None Gait Pattern/deviations: Step-through pattern;Decreased stride length;Staggering left;Staggering right;Drifts right/left     General Gait Details: Assist to stabilize throughout ambulation distance. LOB x 2 when walking while turning his head/scanning  environment  Stairs            Wheelchair Mobility    Modified Rankin (Stroke Patients Only)       Balance Overall balance assessment: Needs assistance;History of Falls           Standing balance-Leahy Scale: Poor                               Pertinent Vitals/Pain Pain Assessment: No/denies pain    Home Living Family/patient expects to be discharged to:: Private residence Living Arrangements: Spouse/significant other Available Help at Discharge: Family;Available 24 hours/day Type of Home: House Home Access: Stairs to enter Entrance Stairs-Rails: Right Entrance Stairs-Number of Steps: 2 Home Layout: One level Home Equipment: Walker - 2 wheels;Bedside commode;Shower seat - built in;Grab bars - tub/shower      Prior Function Level of Independence: Independent               Hand Dominance        Extremity/Trunk Assessment   Upper Extremity Assessment Upper Extremity Assessment: Generalized weakness    Lower Extremity Assessment Lower Extremity Assessment: Generalized weakness    Cervical / Trunk Assessment Cervical / Trunk Assessment: Normal  Communication   Communication: HOH  Cognition Arousal/Alertness: Awake/alert Behavior During Therapy: WFL for tasks assessed/performed Overall Cognitive Status: Within Functional Limits for tasks assessed                                        General Comments      Exercises     Assessment/Plan  PT Assessment Patient needs continued PT services  PT Problem List Decreased strength;Decreased mobility;Decreased activity tolerance;Decreased balance;Decreased knowledge of use of DME;Decreased cognition       PT Treatment Interventions Gait training;Therapeutic activities;Therapeutic exercise;Patient/family education;Balance training;Functional mobility training;DME instruction    PT Goals (Current goals can be found in the Care Plan section)  Acute Rehab PT  Goals Patient Stated Goal: home per wife PT Goal Formulation: With family Time For Goal Achievement: 06/11/17 Potential to Achieve Goals: Good    Frequency Min 3X/week   Barriers to discharge        Co-evaluation               AM-PAC PT "6 Clicks" Daily Activity  Outcome Measure Difficulty turning over in bed (including adjusting bedclothes, sheets and blankets)?: A Little Difficulty moving from lying on back to sitting on the side of the bed? : A Little Difficulty sitting down on and standing up from a chair with arms (e.g., wheelchair, bedside commode, etc,.)?: Total Help needed moving to and from a bed to chair (including a wheelchair)?: A Little Help needed walking in hospital room?: A Little Help needed climbing 3-5 steps with a railing? : A Little 6 Click Score: 16    End of Session Equipment Utilized During Treatment: Gait belt Activity Tolerance: Patient tolerated treatment well Patient left: in bed;with call bell/phone within reach;with family/visitor present   PT Visit Diagnosis: Muscle weakness (generalized) (M62.81);Difficulty in walking, not elsewhere classified (R26.2)    Time: 4193-7902 PT Time Calculation (min) (ACUTE ONLY): 18 min   Charges:   PT Evaluation $PT Eval Low Complexity: 1 Procedure     PT G Codes:         Weston Anna, MPT Pager: [phone]

## 2017-05-28 NOTE — Progress Notes (Signed)
Patient manual BP 80/50 MD paged to make aware. Pt resting in bed with visitors, no complaints.

## 2017-05-28 NOTE — Progress Notes (Addendum)
[facility] PROGRESS NOTE    Progress Note  [name]  YBO:[id number] DOB: 10-19-1930 DOA: 05/26/2017 PCP: Unk Pinto, MD     Brief Narrative:   Paul Davies is an 83 y.o. male past medical history of essential hypertension, chronic atrial fibrillation dementia with an indwelling Foley catheter due to BPH presents for altered mental status most of the history was obtained from the daughter is a patient cannot provide history. Was found to have a temperature of 102 in the ED Foley was exchanged by his urologist 5 days prior to admission. In the ED he was found to have a fever 102 with a white count of 16 with a left shift so we're consulted for evaluation.  Assessment/Plan:   Sepsis secondary to UTI possible due to indwelling foley cath: He is now afebrile, urine culture grew more than 100,000 colonies of them negative rods he was started on IV Rocephin on admission he has no previous history of resistant bacteria's. We'll await sensitivities And speciation. Physical therapy consulted.  Acute encephalopathy: Likely due to infectious etiology.  Essential hypertension: No changes made to his medication.  Chronic atrial fibrillation due to sick sinus syndrome, status post pacemaker: Continue Eluquis and amiodarone.  Chronic combined systolic and diastolic heart failure with an EF of 45% on 3 2017. Seems to be euvolemic continue strict I's and O's daily weights.  Advanced dementia: Continue Namenda.  BPH Continue Flomax.   DVT prophylaxis: Eluquis Family Communication:None Disposition Plan/Barrier to D/C: home in 1 days Code Status:     Code Status Orders        Start     Ordered   05/26/17 2312  Full code  Continuous     05/26/17 2313    Code Status History    Date Active Date Inactive Code Status Order ID Comments User Context   04/14/2016  7:14 AM 04/16/2016  5:19 PM Full Code [id number]  Lily Kocher, MD Inpatient   01/28/2016  6:52 AM  01/31/2016 12:30 PM Full Code [id number]  Minus Breeding, MD ED   05/04/2014  5:22 PM 05/04/2014 10:37 PM Full Code [id number]  [name], MD Inpatient    Advance Directive Documentation     Most Recent Value  Type of Advance Directive  Healthcare Power of [place], Living will  Pre-existing out of facility DNR order (yellow form or pink MOST form)  -  "MOST" Form in Place?  -        IV Access:    Peripheral IV   Procedures and diagnostic studies:   Dg Chest Port 1 View  Result Date: 05/26/2017 CLINICAL DATA:  Fever and altered mental status EXAM: PORTABLE CHEST 1 VIEW COMPARISON:  Chest radiograph 02/25/2017 FINDINGS: Unchanged position of left chest wall pacemaker leads. Unchanged cardiomediastinal contours. No focal airspace consolidation or pulmonary edema. No pleural effusion or pneumothorax. Healed right sixth rib fracture. IMPRESSION: No active disease. Electronically Signed   By: Ulyses Jarred M.D.   On: 05/26/2017 22:19     Medical Consultants:    None.  Anti-Infectives:   Rocephin  Subjective:    Reyes J [place] no new complaints  Objective:    Vitals:   05/27/17 0645 05/27/17 1532 05/27/17 2001 05/28/17 0523  BP: (!) 93/56 (!) 92/48 (!) 99/57 (!) 90/47  Pulse: 61 (!) 59 62 (!) 58  Resp: 20 16 16 17   Temp: 99.1 F (37.3 C) 97.8 F (36.6 C) 98.5 F (36.9 C) 98.4 F (36.9 C)  TempSrc: Oral Oral Oral Oral  SpO2: 97% 96% 94% 96%  Weight:    75.5 kg (166 lb 7.2 oz)  Height:        Intake/Output Summary (Last 24 hours) at 05/28/17 0843 Last data filed at 05/28/17 0600  Gross per 24 hour  Intake           623.67 ml  Output             1350 ml  Net          -726.33 ml   Filed Weights   05/26/17 2159 05/27/17 0115 05/28/17 0523  Weight: 79.4 kg (175 lb) 74.8 kg (165 lb) 75.5 kg (166 lb 7.2 oz)    Exam: General exam: In no acute distress Respiratory system: Good air movement and clear to auscultation. Cardiovascular system: Rate and rhythm  with positive S1-S2 no murmurs or gallops. Gastrointestinal system: Abdomen is soft, nontender nondistended. Central nervous system: No focal Extremities: No lower extremity edema or rashes Skin: No rashes   Data Reviewed:    Labs: Basic Metabolic Panel:  Recent Labs Lab 05/26/17 2153 05/27/17 0059  NA 134* 135  K 3.7 3.7  CL 101 104  CO2 25 24  GLUCOSE 144* 133*  BUN 17 15  CREATININE 1.15 1.13  CALCIUM 9.0 8.4*   GFR Estimated Creatinine Clearance: 43.1 mL/min (by C-G formula based on SCr of 1.13 mg/dL). Liver Function Tests:  Recent Labs Lab 05/26/17 2153  AST 13*  ALT 9*  ALKPHOS 58  BILITOT 0.8  PROT 7.1  ALBUMIN 3.5   No results for input(s): LIPASE, AMYLASE in the last 168 hours. No results for input(s): AMMONIA in the last 168 hours. Coagulation profile No results for input(s): INR, PROTIME in the last 168 hours.  CBC:  Recent Labs Lab 05/26/17 2153 05/27/17 0059  WBC 16.8* 18.4*  NEUTROABS 14.8*  --   HGB 12.2* 11.2*  HCT 36.1* 33.5*  MCV 90.5 90.8  PLT 197 181   Cardiac Enzymes: No results for input(s): CKTOTAL, CKMB, CKMBINDEX, TROPONINI in the last 168 hours. BNP (last 3 results) No results for input(s): PROBNP in the last 8760 hours. CBG: No results for input(s): GLUCAP in the last 168 hours. D-Dimer: No results for input(s): DDIMER in the last 72 hours. Hgb A1c: No results for input(s): HGBA1C in the last 72 hours. Lipid Profile: No results for input(s): CHOL, HDL, LDLCALC, TRIG, CHOLHDL, LDLDIRECT in the last 72 hours. Thyroid function studies: No results for input(s): TSH, T4TOTAL, T3FREE, THYROIDAB in the last 72 hours.  Invalid input(s): FREET3 Anemia work up: No results for input(s): VITAMINB12, FOLATE, FERRITIN, TIBC, IRON, RETICCTPCT in the last 72 hours. Sepsis Labs:  Recent Labs Lab 05/26/17 2149 05/26/17 2153 05/27/17 0059  PROCALCITON  --   --  9.07  WBC  --  16.8* 18.4*  LATICACIDVEN 1.51  --   --     Microbiology Recent Results (from the past 240 hour(s))  Urine culture     Status: Abnormal (Preliminary result)   Collection Time: 05/26/17  9:53 PM  Result Value Ref Range Status   Specimen Description URINE, CATHETERIZED  Final   Special Requests NONE  Final   Culture >=100,000 COLONIES/mL GRAM NEGATIVE RODS (A)  Final   Report Status PENDING  Incomplete     Medications:   . amiodarone  100 mg Oral BID  . apixaban  5 mg Oral BID  . levothyroxine  50 mcg Oral QAC breakfast  .  memantine  28 mg Oral Daily  . metoprolol succinate  25 mg Oral Daily  . tamsulosin  0.4 mg Oral BID   Continuous Infusions: . sodium chloride 10 mL/hr at 05/27/17 0908  . cefTRIAXone (ROCEPHIN)  IV 2 g (05/28/17 0554)     LOS: 2 days   Charlynne Cousins  [facility] Pager [phone]  *Please refer to [email or web address], password [email or web address] to get updated schedule on who will round on this patient, as hospitalists switch teams weekly. If 7PM-7AM, please contact night-coverage at [email or web address], password [email or web address] for any overnight needs.  05/28/2017, 8:43 AM

## 2017-05-29 DIAGNOSIS — A419 Sepsis, unspecified organism: Secondary | ICD-10-CM

## 2017-05-29 DIAGNOSIS — Z7901 Long term (current) use of anticoagulants: Secondary | ICD-10-CM

## 2017-05-29 DIAGNOSIS — N390 Urinary tract infection, site not specified: Secondary | ICD-10-CM

## 2017-05-29 DIAGNOSIS — Z950 Presence of cardiac pacemaker: Secondary | ICD-10-CM

## 2017-05-29 LAB — URINE CULTURE

## 2017-05-29 MED ORDER — SODIUM CHLORIDE 0.9 % IV BOLUS (SEPSIS)
250.0000 mL | Freq: Once | INTRAVENOUS | Status: AC
Start: 2017-05-29 — End: 2017-05-29

## 2017-05-29 MED ORDER — SODIUM CHLORIDE 0.9 % IV SOLN: INTRAVENOUS | Status: DC

## 2017-05-29 MED ORDER — CEFUROXIME AXETIL 250 MG PO TABS: 250.0000 mg | ORAL_TABLET | Freq: Two times a day (BID) | ORAL | 0 refills | Status: DC

## 2017-05-29 NOTE — Progress Notes (Signed)
05/29/17  1615  Reviewed discharge instructions with patient and family. Re educated patient and son on how the cleanse foley. Both verbalized understanding of foley care and discharge instructions. Copy of discharge instructions given to patient.

## 2017-05-29 NOTE — Care Management Note (Signed)
Case Management Note  Patient Details  Name: Paul Davies MRN: [id number] Date of Birth: 06/20/1930  Subjective/Objective:   Sepsis s/t UTI, Acute Encephalopathy,  HTN, afib, CHF                Action/Plan: Discharge Planning: NCM spoke to pt, wife and dtr at bedside. Offered choice for HH/list provided. Wife requested [facility] for [place] [place] CSW. Contacted [facility] for [facility] with new referral. Pt has RW and 3n1 bedside commode at home.   PCP  Unk Pinto MD   Expected Discharge Date:  05/29/17               Expected Discharge Plan:  [place]  In-House Referral:  NA  Discharge planning Services  CM Consult  Post Acute Care Choice:  Home Health Choice offered to:  Spouse  DME Arranged:  N/A DME Agency:  NA  HH Arranged:  PT, Social Work [employer] Agency:  [facility] [employer]  Status of Service:  Completed, signed off  If discussed at [employer], dates discussed:    Additional Comments:  Erenest Rasher, RN 05/29/2017, 2:41 PM

## 2017-05-29 NOTE — Discharge Summary (Addendum)
Physician Discharge Summary  Paul Davies ZOX:[id number] DOB: Sep 27, 1930 DOA: 05/26/2017  PCP: Unk Pinto, MD  Admit date: 05/26/2017 Discharge date: 05/29/2017  Admitted From: Home Disposition:  Home  Recommendations for Outpatient Follow-up:  1. Follow up with PCP in 1-2 weeks 2. Follow up with Urology as scheduled 3. May ultimately need nursing home referral if dementia progresses down the road  Discharge Condition:Improved CODE STATUS:Full Diet recommendation: Heart healthy   Brief/Interim Summary: Paul Davies is an 83 y.o. male past medical history of essential hypertension, chronic atrial fibrillation dementia with an indwelling Foley catheter due to BPH presents for altered mental status most of the history was obtained from the daughter is a patient cannot provide history. Was found to have a temperature of 102 in the ED Foley was exchanged by his urologist 5 days prior to admission. In the ED he was found to have a fever 102 with a white count of 16 with a left shift so we're consulted for evaluation.  Sepsis secondary to UTI [facility]) related to foley cath: Urine culture grew more than 100,000 colonies of multi-drug sensitive klebsiella Physical therapy consulted, recs for home PT Pt had been on rocephin. Will transition vantin to complete course as outpatient, 6 more days  Acute encephalopathy: Likely due to infectious etiology. Improved  Essential hypertension: No changes made to his medication. Remained stable  Chronic atrial fibrillation due to sick sinus syndrome, status post pacemaker: Continue Eluquis and amiodarone as tolerated  Chronic combined systolic and diastolic heart failure with an EF of 45% on 3 2017. Seems to be euvolemic   Advanced dementia: Continue Namenda.  BPH Continue Flomax.  Discharge Diagnoses:  Principal Problem:   Sepsis secondary to UTI [facility]) Active Problems:   Paroxysmal atrial fibrillation (HCC)   BPH   SDAT  (senile dementia of Alzheimer's type)   Current use of long term anticoagulation   Essential hypertension   Cardiac pacemaker in situ    Discharge Instructions   Allergies as of 05/29/2017      Reactions   Bee Venom Anaphylaxis      Medication List    TAKE these medications   amiodarone 200 MG tablet Commonly known as:  PACERONE Take 100 mg by mouth 2 (two) times daily. Taking half tablet daily   cefUROXime 250 MG tablet Commonly known as:  CEFTIN Take 1 tablet (250 mg total) by mouth 2 (two) times daily with a meal.   cholecalciferol 1000 units tablet Commonly known as:  VITAMIN D Take 2,000 Units by mouth daily.   ELIQUIS 5 MG Tabs tablet Generic drug:  apixaban Take 5 mg by mouth 2 (two) times daily.   metoprolol succinate 25 MG 24 hr tablet Commonly known as:  TOPROL-XL Take 25 mg by mouth daily.   NAMENDA XR 28 MG Cp24 24 hr capsule Generic drug:  memantine Take 1 capsule (28 mg total) by mouth daily.   QUEtiapine 25 MG tablet Commonly known as:  SEROQUEL Take 1 tablet 3 times a day PRN for agitation.   SYNTHROID 50 MCG tablet Generic drug:  levothyroxine TAKE 1 TABLET DAILY BEFORE BREAKFAST   tamsulosin 0.4 MG Caps capsule Commonly known as:  FLOMAX Take 1 capsule (0.4 mg total) by mouth daily after supper. What changed:  when to take this       Allergies  Allergen Reactions  . Bee Venom Anaphylaxis   Procedures/Studies: Dg Chest Port 1 View  Result Date: 05/26/2017 CLINICAL DATA:  Fever and  altered mental status EXAM: PORTABLE CHEST 1 VIEW COMPARISON:  Chest radiograph 02/25/2017 FINDINGS: Unchanged position of left chest wall pacemaker leads. Unchanged cardiomediastinal contours. No focal airspace consolidation or pulmonary edema. No pleural effusion or pneumothorax. Healed right sixth rib fracture. IMPRESSION: No active disease. Electronically Signed   By: Ulyses Jarred M.D.   On: 05/26/2017 22:19    Subjective: Eager to go home  Discharge  Exam: Vitals:   05/28/17 2049 05/29/17 0427  BP: (!) 95/57 (!) 134/58  Pulse: 74 65  Resp: 18 18  Temp: 97.9 F (36.6 C) (!) 97.4 F (36.3 C)   Vitals:   05/28/17 1402 05/28/17 1846 05/28/17 2049 05/29/17 0427  BP: (!) 81/53 (!) 80/50 (!) 95/57 (!) 134/58  Pulse: 76  74 65  Resp: 18  18 18   Temp: 97.6 F (36.4 C)  97.9 F (36.6 C) (!) 97.4 F (36.3 C)  TempSrc: Oral  Oral Oral  SpO2: 96%  94% 94%  Weight:      Height:        General: Pt is alert, awake, not in acute distress Cardiovascular: RRR, S1/S2 +, no rubs, no gallops Respiratory: CTA bilaterally, no wheezing, no rhonchi Abdominal: Soft, NT, ND, bowel sounds + Extremities: no edema, no cyanosis   The results of significant diagnostics from this hospitalization (including imaging, microbiology, ancillary and laboratory) are listed below for reference.     Microbiology: Recent Results (from the past 240 hour(s))  Blood Culture (routine x 2)     Status: None (Preliminary result)   Collection Time: 05/26/17  9:53 PM  Result Value Ref Range Status   Specimen Description RIGHT ANTECUBITAL  Final   Special Requests   Final    BOTTLES DRAWN AEROBIC AND ANAEROBIC Blood Culture adequate volume   Culture   Final    NO GROWTH 1 DAY Performed at [facility], [address], [place], [place] [address]    Report Status PENDING  Incomplete  Urine culture     Status: Abnormal   Collection Time: 05/26/17  9:53 PM  Result Value Ref Range Status   Specimen Description URINE, CATHETERIZED  Final   Special Requests NONE  Final   Culture >=100,000 COLONIES/mL KLEBSIELLA PNEUMONIAE (A)  Final   Report Status 05/29/2017 FINAL  Final   Organism ID, Bacteria KLEBSIELLA PNEUMONIAE (A)  Final      Susceptibility   Klebsiella pneumoniae - MIC*    AMPICILLIN >=32 RESISTANT Resistant     CEFAZOLIN <=4 SENSITIVE Sensitive     CEFTRIAXONE <=1 SENSITIVE Sensitive     CIPROFLOXACIN <=0.25 SENSITIVE Sensitive     GENTAMICIN <=1  SENSITIVE Sensitive     IMIPENEM <=0.25 SENSITIVE Sensitive     NITROFURANTOIN 32 SENSITIVE Sensitive     TRIMETH/SULFA <=20 SENSITIVE Sensitive     AMPICILLIN/SULBACTAM >=32 RESISTANT Resistant     PIP/TAZO 32 INTERMEDIATE Intermediate     Extended ESBL NEGATIVE Sensitive     * >=100,000 COLONIES/mL KLEBSIELLA PNEUMONIAE  Blood Culture (routine x 2)     Status: None (Preliminary result)   Collection Time: 05/27/17  1:00 AM  Result Value Ref Range Status   Specimen Description BLOOD LEFT ANTECUBITAL  Final   Special Requests   Final    BOTTLES DRAWN AEROBIC AND ANAEROBIC Blood Culture adequate volume   Culture   Final    NO GROWTH 1 DAY Performed at [facility], [address] [place],  [address]  Report Status PENDING  Incomplete     Labs: BNP (last 3 results) No results for input(s): BNP in the last 8760 hours. Basic Metabolic Panel:  Recent Labs Lab 05/26/17 2153 05/27/17 0059  NA 134* 135  K 3.7 3.7  CL 101 104  CO2 25 24  GLUCOSE 144* 133*  BUN 17 15  CREATININE 1.15 1.13  CALCIUM 9.0 8.4*   Liver Function Tests:  Recent Labs Lab 05/26/17 2153  AST 13*  ALT 9*  ALKPHOS 58  BILITOT 0.8  PROT 7.1  ALBUMIN 3.5   No results for input(s): LIPASE, AMYLASE in the last 168 hours. No results for input(s): AMMONIA in the last 168 hours. CBC:  Recent Labs Lab 05/26/17 2153 05/27/17 0059  WBC 16.8* 18.4*  NEUTROABS 14.8*  --   HGB 12.2* 11.2*  HCT 36.1* 33.5*  MCV 90.5 90.8  PLT 197 181   Cardiac Enzymes: No results for input(s): CKTOTAL, CKMB, CKMBINDEX, TROPONINI in the last 168 hours. BNP: Invalid input(s): POCBNP CBG: No results for input(s): GLUCAP in the last 168 hours. D-Dimer No results for input(s): DDIMER in the last 72 hours. Hgb A1c No results for input(s): HGBA1C in the last 72 hours. Lipid Profile No results for input(s): CHOL, HDL, LDLCALC, TRIG, CHOLHDL, LDLDIRECT in the last 72 hours. Thyroid function studies No  results for input(s): TSH, T4TOTAL, T3FREE, THYROIDAB in the last 72 hours.  Invalid input(s): FREET3 Anemia work up No results for input(s): VITAMINB12, FOLATE, FERRITIN, TIBC, IRON, RETICCTPCT in the last 72 hours. Urinalysis    Component Value Date/Time   COLORURINE YELLOW 05/26/2017 2153   APPEARANCEUR CLEAR 05/26/2017 2153   LABSPEC 1.015 05/26/2017 2153   PHURINE 5.0 05/26/2017 2153   GLUCOSEU NEGATIVE 05/26/2017 2153   HGBUR MODERATE (A) 05/26/2017 2153   BILIRUBINUR NEGATIVE 05/26/2017 2153   KETONESUR NEGATIVE 05/26/2017 2153   PROTEINUR NEGATIVE 05/26/2017 2153   UROBILINOGEN 0.2 12/19/2014 1136   NITRITE POSITIVE (A) 05/26/2017 2153   LEUKOCYTESUR LARGE (A) 05/26/2017 2153   Sepsis Labs Invalid input(s): PROCALCITONIN,  WBC,  LACTICIDVEN Microbiology Recent Results (from the past 240 hour(s))  Blood Culture (routine x 2)     Status: None (Preliminary result)   Collection Time: 05/26/17  9:53 PM  Result Value Ref Range Status   Specimen Description RIGHT ANTECUBITAL  Final   Special Requests   Final    BOTTLES DRAWN AEROBIC AND ANAEROBIC Blood Culture adequate volume   Culture   Final    NO GROWTH 1 DAY Performed at [facility], [place] [address], [place], [place] [address]    Report Status PENDING  Incomplete  Urine culture     Status: Abnormal   Collection Time: 05/26/17  9:53 PM  Result Value Ref Range Status   Specimen Description URINE, CATHETERIZED  Final   Special Requests NONE  Final   Culture >=100,000 COLONIES/mL KLEBSIELLA PNEUMONIAE (A)  Final   Report Status 05/29/2017 FINAL  Final   Organism ID, Bacteria KLEBSIELLA PNEUMONIAE (A)  Final      Susceptibility   Klebsiella pneumoniae - MIC*    AMPICILLIN >=32 RESISTANT Resistant     CEFAZOLIN <=4 SENSITIVE Sensitive     CEFTRIAXONE <=1 SENSITIVE Sensitive     CIPROFLOXACIN <=0.25 SENSITIVE Sensitive     GENTAMICIN <=1 SENSITIVE Sensitive     IMIPENEM <=0.25 SENSITIVE Sensitive      NITROFURANTOIN 32 SENSITIVE Sensitive     TRIMETH/SULFA <=20 SENSITIVE Sensitive  AMPICILLIN/SULBACTAM >=32 RESISTANT Resistant     PIP/TAZO 32 INTERMEDIATE Intermediate     Extended ESBL NEGATIVE Sensitive     * >=100,000 COLONIES/mL KLEBSIELLA PNEUMONIAE  Blood Culture (routine x 2)     Status: None (Preliminary result)   Collection Time: 05/27/17  1:00 AM  Result Value Ref Range Status   Specimen Description BLOOD LEFT ANTECUBITAL  Final   Special Requests   Final    BOTTLES DRAWN AEROBIC AND ANAEROBIC Blood Culture adequate volume   Culture   Final    NO GROWTH 1 DAY Performed at [facility], [place] [address] [place], [place] [address]    Report Status PENDING  Incomplete     SIGNED:   CHIU, Orpah Melter, MD  [facility] 05/29/2017, 10:44 AM  If 7PM-7AM, please contact night-coverage [email or web address] Password [email or web address]

## 2017-05-29 NOTE — Progress Notes (Signed)
Physical Therapy Treatment Patient Details Name: Paul Davies MRN: [id number] DOB: Feb 20, 1930 Today's Date: 05/29/2017    History of Present Illness 83 yo male admitted with sepsis. Hx of dementia, A fib, COPD, pacemaker, CVA, hypothyroidism, HTN    PT Comments    Progressing with mobility. Pt is eagerly awaiting MD to see if he can go home. Observed pt walking with son just prior to session. Initiated LE standing exercises and balance tasks.  Continue to recommend HHPT f/u for general strength and balance training (due to fall history and current unsteadiness).    Follow Up Recommendations  Home health PT;Supervision/Assistance - 24 hour     Equipment Recommendations  None recommended by PT    Recommendations for Other Services       Precautions / Restrictions Precautions Precautions: Fall Precaution Comments: Multiple falls per wife Restrictions Weight Bearing Restrictions: No    Mobility  Bed Mobility               General bed mobility comments: oob with son  Transfers     Transfers: Sit to/from Stand Sit to Stand: Supervision            Ambulation/Gait Ambulation/Gait assistance: Min guard Ambulation Distance (Feet): 250 Feet Assistive device: None       General Gait Details: close guard for safety. Mildly unsteady at times but no overt LOB on today. Pt has been walking halls with his son.   Stairs            Wheelchair Mobility    Modified Rankin (Stroke Patients Only)       Balance Overall balance assessment: Needs assistance           Standing balance-Leahy Scale: Fair Side stepping x2 (~15 feet each direction), Backwards walking x2 (~15 feet each time). Min guard assist with pt using 1 hand on handrail.                               Cognition Arousal/Alertness: Awake/alert Behavior During Therapy: WFL for tasks assessed/performed Overall Cognitive Status: Within Functional Limits for tasks assessed                                         Exercises General Exercises - Lower Extremity Hip ABduction/ADduction: AROM;Both;15 reps;Standing Hip Flexion/Marching: AROM;Both;15 reps;Standing Heel Raises: AROM;Both;15 reps;Standing Mini-Sqauts: AROM;15 reps;Standing    General Comments        Pertinent Vitals/Pain Pain Assessment: No/denies pain    Home Living                      Prior Function            PT Goals (current goals can now be found in the care plan section) Progress towards PT goals: Progressing toward goals    Frequency    Min 3X/week      PT Plan Current plan remains appropriate    Co-evaluation              AM-PAC PT "6 Clicks" Daily Activity  Outcome Measure  Difficulty turning over in bed (including adjusting bedclothes, sheets and blankets)?: None Difficulty moving from lying on back to sitting on the side of the bed? : None Difficulty sitting down on and standing up from a chair with arms (e.g., wheelchair, bedside commode,  etc,.)?: None Help needed moving to and from a bed to chair (including a wheelchair)?: A Little Help needed walking in hospital room?: A Little Help needed climbing 3-5 steps with a railing? : A Little 6 Click Score: 21    End of Session Equipment Utilized During Treatment: Gait belt Activity Tolerance: Patient tolerated treatment well Patient left: in chair;with call bell/phone within reach   PT Visit Diagnosis: Difficulty in walking, not elsewhere classified (R26.2);Muscle weakness (generalized) (M62.81)     Time: 2897-9150 PT Time Calculation (min) (ACUTE ONLY): 15 min  Charges:  $Gait Training: 8-22 mins                    G Codes:          Weston Anna, MPT Pager: [phone]

## 2017-05-29 NOTE — Care Management Important Message (Signed)
Important Message  Patient Details  Name: ELVER STADLER MRN: [id number] Date of Birth: December 28, 1929   Medicare Important Message Given:  Yes    Erenest Rasher, RN 05/29/2017, 2:56 PM

## 2017-05-31 DIAGNOSIS — R338 Other retention of urine: Secondary | ICD-10-CM | POA: Diagnosis not present

## 2017-05-31 DIAGNOSIS — N3000 Acute cystitis without hematuria: Secondary | ICD-10-CM | POA: Diagnosis not present

## 2017-06-01 DIAGNOSIS — R338 Other retention of urine: Secondary | ICD-10-CM | POA: Diagnosis not present

## 2017-06-01 LAB — CULTURE, BLOOD (ROUTINE X 2)
CULTURE: NO GROWTH
CULTURE: NO GROWTH
Special Requests: ADEQUATE
Special Requests: ADEQUATE

## 2017-06-02 ENCOUNTER — Other Ambulatory Visit: Payer: Self-pay | Admitting: Internal Medicine

## 2017-06-02 DIAGNOSIS — B961 Klebsiella pneumoniae [K. pneumoniae] as the cause of diseases classified elsewhere: Secondary | ICD-10-CM | POA: Diagnosis not present

## 2017-06-02 DIAGNOSIS — I110 Hypertensive heart disease with heart failure: Secondary | ICD-10-CM | POA: Diagnosis not present

## 2017-06-02 DIAGNOSIS — F0280 Dementia in other diseases classified elsewhere without behavioral disturbance: Secondary | ICD-10-CM | POA: Diagnosis not present

## 2017-06-02 DIAGNOSIS — G309 Alzheimer's disease, unspecified: Secondary | ICD-10-CM | POA: Diagnosis not present

## 2017-06-02 DIAGNOSIS — N390 Urinary tract infection, site not specified: Secondary | ICD-10-CM | POA: Diagnosis not present

## 2017-06-02 DIAGNOSIS — J449 Chronic obstructive pulmonary disease, unspecified: Secondary | ICD-10-CM | POA: Diagnosis not present

## 2017-06-04 DIAGNOSIS — N390 Urinary tract infection, site not specified: Secondary | ICD-10-CM | POA: Diagnosis not present

## 2017-06-04 DIAGNOSIS — B961 Klebsiella pneumoniae [K. pneumoniae] as the cause of diseases classified elsewhere: Secondary | ICD-10-CM | POA: Diagnosis not present

## 2017-06-04 DIAGNOSIS — I110 Hypertensive heart disease with heart failure: Secondary | ICD-10-CM | POA: Diagnosis not present

## 2017-06-04 DIAGNOSIS — G309 Alzheimer's disease, unspecified: Secondary | ICD-10-CM | POA: Diagnosis not present

## 2017-06-04 DIAGNOSIS — J449 Chronic obstructive pulmonary disease, unspecified: Secondary | ICD-10-CM | POA: Diagnosis not present

## 2017-06-04 DIAGNOSIS — F0280 Dementia in other diseases classified elsewhere without behavioral disturbance: Secondary | ICD-10-CM | POA: Diagnosis not present

## 2017-06-08 DIAGNOSIS — G309 Alzheimer's disease, unspecified: Secondary | ICD-10-CM | POA: Diagnosis not present

## 2017-06-08 DIAGNOSIS — N390 Urinary tract infection, site not specified: Secondary | ICD-10-CM | POA: Diagnosis not present

## 2017-06-08 DIAGNOSIS — J449 Chronic obstructive pulmonary disease, unspecified: Secondary | ICD-10-CM | POA: Diagnosis not present

## 2017-06-08 DIAGNOSIS — F0280 Dementia in other diseases classified elsewhere without behavioral disturbance: Secondary | ICD-10-CM | POA: Diagnosis not present

## 2017-06-08 DIAGNOSIS — I110 Hypertensive heart disease with heart failure: Secondary | ICD-10-CM | POA: Diagnosis not present

## 2017-06-08 DIAGNOSIS — B961 Klebsiella pneumoniae [K. pneumoniae] as the cause of diseases classified elsewhere: Secondary | ICD-10-CM | POA: Diagnosis not present

## 2017-06-09 ENCOUNTER — Encounter: Payer: Self-pay | Admitting: Physician Assistant

## 2017-06-09 ENCOUNTER — Ambulatory Visit (INDEPENDENT_AMBULATORY_CARE_PROVIDER_SITE_OTHER): Payer: Medicare Other | Admitting: Physician Assistant

## 2017-06-09 VITALS — BP 116/70 | HR 93 | Temp 97.5°F | Resp 14 | Ht 67.0 in | Wt 163.2 lb

## 2017-06-09 DIAGNOSIS — G4733 Obstructive sleep apnea (adult) (pediatric): Secondary | ICD-10-CM | POA: Diagnosis not present

## 2017-06-09 DIAGNOSIS — I951 Orthostatic hypotension: Secondary | ICD-10-CM | POA: Diagnosis not present

## 2017-06-09 DIAGNOSIS — I480 Paroxysmal atrial fibrillation: Secondary | ICD-10-CM

## 2017-06-09 DIAGNOSIS — F0280 Dementia in other diseases classified elsewhere without behavioral disturbance: Secondary | ICD-10-CM

## 2017-06-09 DIAGNOSIS — E039 Hypothyroidism, unspecified: Secondary | ICD-10-CM | POA: Diagnosis not present

## 2017-06-09 DIAGNOSIS — I5042 Chronic combined systolic (congestive) and diastolic (congestive) heart failure: Secondary | ICD-10-CM

## 2017-06-09 DIAGNOSIS — Z7901 Long term (current) use of anticoagulants: Secondary | ICD-10-CM | POA: Diagnosis not present

## 2017-06-09 DIAGNOSIS — I495 Sick sinus syndrome: Secondary | ICD-10-CM | POA: Diagnosis not present

## 2017-06-09 DIAGNOSIS — G301 Alzheimer's disease with late onset: Secondary | ICD-10-CM | POA: Diagnosis not present

## 2017-06-09 DIAGNOSIS — R42 Dizziness and giddiness: Secondary | ICD-10-CM | POA: Diagnosis not present

## 2017-06-09 DIAGNOSIS — Z8673 Personal history of transient ischemic attack (TIA), and cerebral infarction without residual deficits: Secondary | ICD-10-CM | POA: Diagnosis not present

## 2017-06-09 DIAGNOSIS — A419 Sepsis, unspecified organism: Secondary | ICD-10-CM | POA: Diagnosis not present

## 2017-06-09 DIAGNOSIS — E785 Hyperlipidemia, unspecified: Secondary | ICD-10-CM | POA: Diagnosis not present

## 2017-06-09 DIAGNOSIS — J449 Chronic obstructive pulmonary disease, unspecified: Secondary | ICD-10-CM | POA: Diagnosis not present

## 2017-06-09 DIAGNOSIS — N390 Urinary tract infection, site not specified: Secondary | ICD-10-CM | POA: Diagnosis not present

## 2017-06-09 DIAGNOSIS — Z950 Presence of cardiac pacemaker: Secondary | ICD-10-CM | POA: Diagnosis not present

## 2017-06-09 LAB — HEPATIC FUNCTION PANEL
ALBUMIN: 3.6 g/dL (ref 3.6–5.1)
ALK PHOS: 61 U/L (ref 40–115)
ALT: 9 U/L (ref 9–46)
AST: 12 U/L (ref 10–35)
BILIRUBIN TOTAL: 0.5 mg/dL (ref 0.2–1.2)
Bilirubin, Direct: 0.1 mg/dL (ref <=0.2)
Indirect Bilirubin: 0.4 mg/dL (ref 0.2–1.2)
TOTAL PROTEIN: 6.4 g/dL (ref 6.1–8.1)

## 2017-06-09 LAB — CBC WITH DIFFERENTIAL/PLATELET
BASOS ABS: 0 {cells}/uL (ref 0–200)
BASOS PCT: 0 %
Eosinophils Absolute: 300 cells/uL (ref 15–500)
Eosinophils Relative: 4 %
HCT: 36.2 % — ABNORMAL LOW (ref 38.5–50.0)
Hemoglobin: 11.9 g/dL — ABNORMAL LOW (ref 13.2–17.1)
LYMPHS ABS: 1275 {cells}/uL (ref 850–3900)
LYMPHS PCT: 17 %
MCH: 29.7 pg (ref 27.0–33.0)
MCHC: 32.9 g/dL (ref 32.0–36.0)
MCV: 90.3 fL (ref 80.0–100.0)
MONO ABS: 600 {cells}/uL (ref 200–950)
MONOS PCT: 8 %
MPV: 9.8 fL (ref 7.5–12.5)
NEUTROS ABS: 5325 {cells}/uL (ref 1500–7800)
Neutrophils Relative %: 71 %
PLATELETS: 236 10*3/uL (ref 140–400)
RBC: 4.01 MIL/uL — ABNORMAL LOW (ref 4.20–5.80)
RDW: 13.8 % (ref 11.0–15.0)
WBC: 7.5 10*3/uL (ref 3.8–10.8)

## 2017-06-09 LAB — BASIC METABOLIC PANEL WITH GFR
BUN: 13 mg/dL (ref 7–25)
CALCIUM: 9.1 mg/dL (ref 8.6–10.3)
CO2: 24 mmol/L (ref 20–31)
CREATININE: 1.07 mg/dL (ref 0.70–1.11)
Chloride: 105 mmol/L (ref 98–110)
GFR, EST AFRICAN AMERICAN: 73 mL/min (ref >=60)
GFR, Est Non African American: 63 mL/min (ref >=60)
GLUCOSE: 83 mg/dL (ref 65–99)
Potassium: 4.6 mmol/L (ref 3.5–5.3)
Sodium: 140 mmol/L (ref 135–146)

## 2017-06-09 LAB — TSH: TSH: 7.8 m[IU]/L — AB (ref 0.40–4.50)

## 2017-06-09 NOTE — Progress Notes (Signed)
Hospital follow up  Assessment and Plan: Sepsis secondary to UTI [facility]) Follow up with urology Just finished ABX, will check urine at urology or next OV here in 1 month Behavior better, no fevers  Chronic combined systolic and diastolic congestive heart failure (HCC) Weight stable/down, patient not eating as much, will monitor -     CBC with Differential/Platelet -     BASIC METABOLIC PANEL WITH GFR -     Hepatic function panel -     TSH  Paroxysmal atrial fibrillation (HCC) Continue medications, followed by cardiology -     CBC with Differential/Platelet -     BASIC METABOLIC PANEL WITH GFR -     Hepatic function panel -     TSH  Chronic obstructive pulmonary disease, unspecified COPD type ([place]) Monitor symptoms, lungs CTAB at this time -     CBC with Differential/Platelet -     BASIC METABOLIC PANEL WITH GFR -     Hepatic function panel -     TSH  SDAT (senile dementia of Alzheimer's type) Stop Namenda XR may be causing more symptoms/harm than good at this time Monitor behavior Long discussion that dementia will progress Wants to keep in home at this time, may try to get help Does not qualify for hospice at this time but did discuss that if dementia progresses or any new symptoms we can try to get him set up, daughter and son agreeable Discussed end of life care- patient is DNR and has living will  OSA and COPD overlap syndrome ([place]) Coughing but recent normal CXR, no wheezing SOB fever chills  Hospital discharge meds were reviewed, and reconciled with the patient.   Medications Discontinued During This Encounter  Medication Reason  . QUEtiapine (SEROQUEL) 25 MG tablet Completed Course  . tamsulosin (FLOMAX) 0.4 MG CAPS capsule Completed Course  . NAMENDA XR 28 MG CP24 24 hr capsule   . cefUROXime (CEFTIN) 250 MG tablet    Over 40 minutes of exam, counseling, chart review, and complex, high/moderate level critical decision making was performed this visit.   Future  Appointments Date Time Provider Victor  07/23/2017 9:45 AM Unk Pinto, MD [facility] None  09/15/2017 9:45 AM Dennie Bible, NP [facility] None  03/01/2018 10:00 AM Vicie Mutters, PA-C [facility] None     HPI 83 y.o.male presents for follow up for transition from recent hospitalization. Admit date to the hospital was 05/26/17, patient was discharged from the hospital on 05/29/17 and our clinical staff contacted the office the day after discharge to set up a follow up appointment, patient was admitted for:  Sepsis secondary to UTI, infectious encephalopathy.  Patient has a baseline of dementia, has afib on elliquis, has indwelling cath due to BPH, presented to ER with worsening confusion per family, found to have UTI, discharged on ceftin which he has completed and is here for follow up with his son Marden Noble.  Did not start the flomax and is not on the seroquel. Follows up with Dr. Leonie Man in a few months, discussed namenda and stopping it.   BMI is Body mass index is 25.56 kg/m., he is working on diet and exercise. Wt Readings from Last 3 Encounters:  06/09/17 163 lb 3.2 oz (74 kg)  05/29/17 167 lb 15.9 oz (76.2 kg)  02/25/17 174 lb 6.4 oz (79.1 kg)    Images while in the hospital: Dg Chest Port 1 View  Result Date: 05/26/2017 CLINICAL DATA:  Fever and altered mental status EXAM:  PORTABLE CHEST 1 VIEW COMPARISON:  Chest radiograph 02/25/2017 FINDINGS: Unchanged position of left chest wall pacemaker leads. Unchanged cardiomediastinal contours. No focal airspace consolidation or pulmonary edema. No pleural effusion or pneumothorax. Healed right sixth rib fracture. IMPRESSION: No active disease. Electronically Signed   By: Ulyses Jarred M.D.   On: 05/26/2017 22:19    Past Medical History:  Diagnosis Date  . Atherosclerosis of aorta ([place]) 2010   noted on lumbar CT in 2010  . Atrial fibrillation ([place])   . Basal cell carcinoma, face   . Chronic sinusitis   . COPD (chronic  obstructive pulmonary disease) ([place])    On CXR  . DJD (degenerative joint disease)   . History of right MCA stroke ~ 2002; 2006  . Hyperlipidemia   . Hypertension   . Hypothyroidism   . Hypothyroidism   . OSA on CPAP   . Osteopenia   . Pacemaker medtronic   . Pneumonia 01/28/2016  . Prediabetes   . RLS (restless legs syndrome)   . Sepsis ([place]) 04/14/2016  . Sinoatrial node dysfunction (HCC)   . Vitamin D deficiency      Allergies  Allergen Reactions  . Bee Venom Anaphylaxis      Current Outpatient Prescriptions on File Prior to Visit  Medication Sig Dispense Refill  . amiodarone (PACERONE) 200 MG tablet Take 100 mg by mouth 2 (two) times daily. Taking half tablet daily    . apixaban (ELIQUIS) 5 MG TABS tablet Take 5 mg by mouth 2 (two) times daily.    . cholecalciferol (VITAMIN D) 1000 UNITS tablet Take 2,000 Units by mouth daily.     . metoprolol succinate (TOPROL-XL) 25 MG 24 hr tablet Take 25 mg by mouth daily.    Marland Kitchen SYNTHROID 50 MCG tablet TAKE 1 TABLET DAILY BEFORE BREAKFAST 90 tablet 1   No current facility-administered medications on file prior to visit.     ROS: all negative except above.   Physical Exam: Filed Weights   06/09/17 0958  Weight: 163 lb 3.2 oz (74 kg)   BP 116/70   Pulse 93   Temp (!) 97.5 F (36.4 C)   Resp 14   Ht 5\' 7"  (1.702 m)   Wt 163 lb 3.2 oz (74 kg)   SpO2 95%   BMI 25.56 kg/m  General appearance: alert, no distress, WD/WN, male General Appearance: Well nourished, in no apparent distress. Eyes: PERRLA, EOMs, conjunctiva no swelling or erythema, normal fundi and vessels. Sinuses: No Frontal/maxillary tenderness ENT/Mouth: Ext aud canals clear, normal light reflex with TMs without erythema, bulging. Good dentition. No erythema, swelling, or exudate on post pharynx. Tonsils not swollen or erythematous. Hearing decreased Neck: Supple, thyroid normal. No bruits Respiratory: Respiratory effort normal, BS decreased bilaterally without  rales, wheezing or stridor. Cardio: Irreg, Irreg with 2/6 systolic murmur without rubs or gallops.  Chest: symmetric, with normal excursions and percussion. Abdomen: Soft, +BS. Non tender, no guarding, rebound, hernias, masses, or organomegaly.  Lymphatics: Non tender without lymphadenopathy.  Genitourinary: defer Musculoskeletal: Full ROM all peripheral extremities,4/5 strength Skin: Warm, dry without rashes, ecchymosis. Several places on his face,ears, and left temple that need to be frozen/removed.  Neuro: Cranial nerves intact, reflexes equal bilaterally. Decreased muscle tone due to age, no cerebellar symptoms. Sensation intact bilateral feet. Slow to get up from chair, shuffling gait, unsteady with cane.  Psych: flat affect, Insight and Judgment poor    Vicie Mutters, PA-C 10:40 AM [facility]

## 2017-06-09 NOTE — Patient Instructions (Signed)
Namenda stop it for now Can do tylenol and allergy pill  Alzheimer Disease Caregiver Guide Alzheimer disease is an illness that affects a person's brain. It causes a person to lose the ability to remember things and make good decisions. As the disease progresses, the person is unable to take care of himself or herself and needs more and more help to do simple tasks. Taking care of someone with Alzheimer disease can be very challenging and overwhelming. Memory loss and confusion Memory loss and confusion is mild in the beginning stages of the disease. Both of these problems become more severe as the disease progresses. Eventually, the person will not recognize places or even close family members and friends.  Stay calm.  Respond with a short explanation. Long explanations can be overwhelming and confusing.  Avoid corrections that sound like scolding.  Try not to take it personally, even if the person forgets your name.  Behavior changes Behavior changes are part of the disease. The person may develop depression, anxiety, anger, hallucinations, or other behavior changes. These changes can come on suddenly and may be in response to pain, infection, changes in the environment (temperature, noise), overstimulation, or feeling lost or scared.  Try not to take behavior changes personally.  Remain calm and patient.  Do not argue or try to convince the person about a specific point. This will only make him or her more agitated.  Know that the behavior changes are part of the disease process and try to work through it.  Tips to reduce frustration  Schedule wisely by making appointments and doing daily tasks, like bathing and dressing, when the person is at his or her best.  Take your time. Simple tasks may take a lot longer, so be sure to allow for plenty of time.  Limit choices. Too many choices can be overwhelming and stressful for the person.  Involve the person in what you are  doing.  Stick to a routine.  Avoid new or crowded situations, if possible.  Use simple words, short sentences, and a calm voice. Only give one direction at a time.  Buy clothes and shoes that are easy to put on and take off.  Let people help if they offer. Home safety Keeping the home safe is very important to reduce the risk of falls and injuries.  Keep floors clear of clutter. Remove rugs, magazine racks, and floor lamps.  Keep hallways well lit.  Put a handrail and nonslip mat in the bathtub or shower.  Put childproof locks on cabinets with dangerous items, such as medicine, alcohol, guns, toxic cleaning items, sharp tools or utensils, matches, or lighters.  Place locks on doors where the person cannot easily see or reach them. This helps ensure that the person cannot wander out of the house and get lost.  Be prepared for emergencies. Keep a list of emergency phone numbers and addresses in a convenient area.  Plans for the future  Do not put off talking about finances. ? Talk about money management. People with Alzheimer disease have trouble managing their money as the disease gets worse. ? Get help from professional advisors regarding financial and legal matters.  Do not put off talking about future care. ? Choose a power of attorney. This is someone who can make decisions for the person with Alzheimer disease when he or she is no longer able to do so. ? Talk about driving and when it is the right time to stop. The person's health care  provider can help give advice on this matter. ? Talk about the person's living situation. If he or she lives alone, you need to make sure he or she is safe. Some people need extra help at home, and others need more care at a nursing home or care center. Support groups Joining a support group can be very helpful for caregivers of people with Alzheimer disease. Some advantages to being part of a support group include:  Getting strategies to  manage stress.  Sharing experiences with others.  Receiving emotional comfort and support.  Learning new caregiving skills as the disease progresses.  Knowing what community resources are available and taking advantage of them.  Contact a health care provider if:  The person has a fever.  The person has a sudden change in behavior that does not improve with calming strategies.  The person is unable to manage in his or her current living situation.  The person threatens you or anyone else, including himself or herself.  You are no longer able to care for the person. This information is not intended to replace advice given to you by your health care provider. Make sure you discuss any questions you have with your health care provider. Document Released: 07/21/2004 Document Revised: 04/22/2016 Document Reviewed: 12/16/2011 Elsevier Interactive Patient Education  2017 [employer].

## 2017-06-10 DIAGNOSIS — N390 Urinary tract infection, site not specified: Secondary | ICD-10-CM | POA: Diagnosis not present

## 2017-06-10 DIAGNOSIS — I110 Hypertensive heart disease with heart failure: Secondary | ICD-10-CM | POA: Diagnosis not present

## 2017-06-10 DIAGNOSIS — G309 Alzheimer's disease, unspecified: Secondary | ICD-10-CM | POA: Diagnosis not present

## 2017-06-10 DIAGNOSIS — F0280 Dementia in other diseases classified elsewhere without behavioral disturbance: Secondary | ICD-10-CM | POA: Diagnosis not present

## 2017-06-10 DIAGNOSIS — B961 Klebsiella pneumoniae [K. pneumoniae] as the cause of diseases classified elsewhere: Secondary | ICD-10-CM | POA: Diagnosis not present

## 2017-06-10 DIAGNOSIS — J449 Chronic obstructive pulmonary disease, unspecified: Secondary | ICD-10-CM | POA: Diagnosis not present

## 2017-06-11 ENCOUNTER — Other Ambulatory Visit: Payer: Self-pay

## 2017-06-11 NOTE — Telephone Encounter (Signed)
Entered in error

## 2017-06-13 ENCOUNTER — Encounter (HOSPITAL_COMMUNITY): Payer: Self-pay | Admitting: Emergency Medicine

## 2017-06-13 ENCOUNTER — Emergency Department (HOSPITAL_COMMUNITY)
Admission: EM | Admit: 2017-06-13 | Discharge: 2017-06-14 | Disposition: A | Discharge status: Home / Self Care | Payer: Medicare Other | Attending: Emergency Medicine | Admitting: Emergency Medicine

## 2017-06-13 DIAGNOSIS — R339 Retention of urine, unspecified: Secondary | ICD-10-CM | POA: Insufficient documentation

## 2017-06-13 DIAGNOSIS — R1033 Periumbilical pain: Secondary | ICD-10-CM | POA: Diagnosis not present

## 2017-06-13 DIAGNOSIS — R338 Other retention of urine: Secondary | ICD-10-CM

## 2017-06-13 DIAGNOSIS — Y733 Surgical instruments, materials and gastroenterology and urology devices (including sutures) associated with adverse incidents: Secondary | ICD-10-CM | POA: Diagnosis not present

## 2017-06-13 DIAGNOSIS — T839XXA Unspecified complication of genitourinary prosthetic device, implant and graft, initial encounter: Secondary | ICD-10-CM | POA: Diagnosis not present

## 2017-06-13 DIAGNOSIS — T83028A Displacement of other indwelling urethral catheter, initial encounter: Secondary | ICD-10-CM | POA: Diagnosis not present

## 2017-06-13 LAB — URINALYSIS, ROUTINE W REFLEX MICROSCOPIC
Bilirubin Urine: NEGATIVE
Glucose, UA: NEGATIVE mg/dL
Ketones, ur: NEGATIVE mg/dL
Nitrite: NEGATIVE
Protein, ur: 30 mg/dL — AB
Specific Gravity, Urine: 1.012 (ref 1.005–1.030)
Squamous Epithelial / LPF: NONE SEEN
pH: 6 (ref 5.0–8.0)

## 2017-06-13 LAB — CBC WITH DIFFERENTIAL/PLATELET
Basophils Absolute: 0 10*3/uL (ref 0.0–0.1)
Basophils Relative: 0 %
EOS ABS: 0.2 10*3/uL (ref 0.0–0.7)
Eosinophils Relative: 2 %
HEMATOCRIT: 35.9 % — AB (ref 39.0–52.0)
HEMOGLOBIN: 12 g/dL — AB (ref 13.0–17.0)
LYMPHS ABS: 1.4 10*3/uL (ref 0.7–4.0)
LYMPHS PCT: 12 %
MCH: 30.4 pg (ref 26.0–34.0)
MCHC: 33.4 g/dL (ref 30.0–36.0)
MCV: 90.9 fL (ref 78.0–100.0)
MONOS PCT: 10 %
Monocytes Absolute: 1.1 10*3/uL — ABNORMAL HIGH (ref 0.1–1.0)
NEUTROS PCT: 76 %
Neutro Abs: 8.4 10*3/uL — ABNORMAL HIGH (ref 1.7–7.7)
Platelets: 195 10*3/uL (ref 150–400)
RBC: 3.95 MIL/uL — ABNORMAL LOW (ref 4.22–5.81)
RDW: 13.6 % (ref 11.5–15.5)
WBC: 11.1 10*3/uL — ABNORMAL HIGH (ref 4.0–10.5)

## 2017-06-13 LAB — BASIC METABOLIC PANEL
Anion gap: 10 (ref 5–15)
BUN: 22 mg/dL — ABNORMAL HIGH (ref 6–20)
CHLORIDE: 105 mmol/L (ref 101–111)
CO2: 21 mmol/L — AB (ref 22–32)
Calcium: 8.8 mg/dL — ABNORMAL LOW (ref 8.9–10.3)
Creatinine, Ser: 0.95 mg/dL (ref 0.61–1.24)
GFR calc non Af Amer: >60 mL/min (ref >60)
Glucose, Bld: 106 mg/dL — ABNORMAL HIGH (ref 65–99)
POTASSIUM: 4 mmol/L (ref 3.5–5.1)
Sodium: 136 mmol/L (ref 135–145)

## 2017-06-13 MED ORDER — FLUCONAZOLE 150 MG PO TABS
150.0000 mg | ORAL_TABLET | Freq: Once | ORAL | Status: AC
  Administered 2017-06-14: 150 mg via ORAL
  Filled 2017-06-13: qty 1

## 2017-06-13 NOTE — ED Notes (Signed)
Bed: WLPT2 Expected date:  Expected time:  Means of arrival:  Comments: 

## 2017-06-14 DIAGNOSIS — R339 Retention of urine, unspecified: Secondary | ICD-10-CM | POA: Diagnosis not present

## 2017-06-14 MED ORDER — CEPHALEXIN 500 MG PO CAPS: 500.0000 mg | ORAL_CAPSULE | Freq: Two times a day (BID) | ORAL | 0 refills | Status: AC

## 2017-06-14 NOTE — ED Provider Notes (Signed)
[facility] DEPT Provider Note   CSN: [id number] Arrival date & time: 06/13/17  2121     History   Chief Complaint Chief Complaint  Patient presents with  . Urinary Retention    HPI Paul Davies is a 83 y.o. male.  Level V caveat: Dementia.   Paul Davies is a 83 y.o. Male who presents to the emergency department with his wife and son complaining of suprapubic abdominal pain and trouble urinating. There is some confusion on arrival in triage. However, family reports patient has had a Foley catheter in place for about 3 weeks now. Therefore there is been no drainage from the Foley catheter. Bladder scan revealed greater than 900 ml of urine in his bladder. It was revealed that the foley cath bag was strapped to his leg and the catheter had been pulled out. New foley cath was inserted in triage. Family reports he is constantly messing with the foley catheter and likely pulled it out. They were unaware the catheter had been pulled out until arrival to the ER. On my exam the patient denies any complaints.    The history is provided by the patient, the spouse, a relative and medical records. No language interpreter was used.    Past Medical History:  Diagnosis Date  . Atherosclerosis of aorta ([place]) 2010   noted on lumbar CT in 2010  . Atrial fibrillation ([place])   . Basal cell carcinoma, face   . Chronic sinusitis   . COPD (chronic obstructive pulmonary disease) ([place])    On CXR  . DJD (degenerative joint disease)   . History of right MCA stroke ~ 2002; 2006  . Hyperlipidemia   . Hypertension   . Hypothyroidism   . Hypothyroidism   . OSA on CPAP   . Osteopenia   . Pacemaker medtronic   . Pneumonia 01/28/2016  . Prediabetes   . RLS (restless legs syndrome)   . Sepsis ([place]) 04/14/2016  . Sinoatrial node dysfunction (HCC)   . Vitamin D deficiency     Patient Active Problem List   Diagnosis Date Noted  . Sepsis secondary to UTI ([place]) 05/26/2017  . Falls frequently  03/16/2017  . Medication management 04/22/2016  . Prediabetes 04/22/2016  . Hypothyroidism 04/14/2016  . Chronic combined systolic and diastolic congestive heart failure ([place]) 01/30/2016  . Atherosclerosis of aorta ([place])   . Sick sinus syndrome ([place]) 12/19/2014  . COPD (chronic obstructive pulmonary disease) ([place]) 12/19/2014  . RLS (restless legs syndrome) 12/19/2014  . Cardiac pacemaker in situ   . Essential hypertension 03/19/2014  . Current use of long term anticoagulation   . OSA and COPD overlap syndrome ([place])   . SDAT (senile dementia of Alzheimer's type) 03/06/2014  . Hyperlipidemia   . Vitamin D deficiency   . Abnormal blood sugar   . History of stroke   . Paroxysmal atrial fibrillation (HCC)   . BPH 12/20/2008    Past Surgical History:  Procedure Laterality Date  . BASAL CELL CARCINOMA EXCISION     "face"  . CARDIAC CATHETERIZATION  02/2004   normal coronary arteries  . INSERT / REPLACE / REMOVE PACEMAKER  04/2005  . PACEMAKER GENERATOR CHANGE N/A 05/04/2014   Procedure: PACEMAKER GENERATOR CHANGE;  Surgeon: [name], MD;  Location: [facility] CATH LAB;  Service: Cardiovascular;  Laterality: N/A;  . SHOULDER OPEN ROTATOR CUFF REPAIR Right 02/2008  . TRANSURETHRAL RESECTION OF PROSTATE         Home Medications  Prior to Admission medications   Medication Sig Start Date End Date Taking? Authorizing Provider  amiodarone (PACERONE) 200 MG tablet Take 100 mg by mouth 2 (two) times daily. Taking half tablet daily    [provider]  apixaban (ELIQUIS) 5 MG TABS tablet Take 5 mg by mouth 2 (two) times daily.    [provider]  cephALEXin (KEFLEX) 500 MG capsule Take 1 capsule (500 mg total) by mouth 2 (two) times daily. 06/14/17   Waynetta Pean, PA-C  cholecalciferol (VITAMIN D) 1000 UNITS tablet Take 2,000 Units by mouth daily.     [provider]  metoprolol succinate (TOPROL-XL) 25 MG 24 hr tablet Take 25 mg by mouth daily. 05/06/17    [provider]  SYNTHROID 50 MCG tablet TAKE 1 TABLET DAILY BEFORE BREAKFAST 12/23/16   Vicie Mutters, PA-C    Family History Family History  Problem Relation Age of Onset  . Heart attack Father   . Stroke Sister   . Dementia Sister   . Heart attack Brother 48    Social History Social History  Substance Use Topics  . Smoking status: Passive Smoke Exposure - Never Smoker  . Smokeless tobacco: Never Used  . Alcohol use No     Allergies   Bee venom   Review of Systems Review of Systems  Unable to perform ROS: Dementia  Genitourinary: Positive for decreased urine volume.     Physical Exam Updated Vital Signs BP (!) 153/88 (BP Location: Left Arm)   Pulse 64   Temp 98 F (36.7 C) (Oral)   Resp 18   Ht 5\' 7"  (1.702 m)   Wt 73.9 kg (163 lb)   SpO2 96%   BMI 25.53 kg/m   Physical Exam  Constitutional: He appears well-developed and well-nourished. No distress.  Nontoxic-appearing.  HENT:  Head: Normocephalic and atraumatic.  Eyes: Pupils are equal, round, and reactive to light. Conjunctivae are normal. Right eye exhibits no discharge. Left eye exhibits no discharge.  Neck: Neck supple.  Cardiovascular: Normal rate, regular rhythm, normal heart sounds and intact distal pulses.   Pulmonary/Chest: Effort normal and breath sounds normal. No respiratory distress.  Abdominal: Soft. There is no tenderness.  Abdomen is soft and nontender to palpation.  Genitourinary: Penis normal.  Genitourinary Comments: New Foley catheter in place to his penis. No penile or testicular tenderness. Bag at bedside has almost a liter of urine in it. Urine is slightly cloudy and yellow. Patient's old urinary catheter bag is strapped to his leg and he is sitting on the old catheter. This was removed by myself. Only about 5 mL of fluid were in the Foley catheter bulb.  Musculoskeletal: He exhibits no edema.  Lymphadenopathy:    He has no cervical adenopathy.  Neurological: He is  alert. Coordination normal.  Pleasantly demented.  Skin: Skin is warm and dry. No rash noted. He is not diaphoretic. No erythema. No pallor.  Psychiatric: He has a normal mood and affect. His behavior is normal.  Nursing note and vitals reviewed.    ED Treatments / Results  Labs (all labs ordered are listed, but only abnormal results are displayed) Labs Reviewed  URINALYSIS, ROUTINE W REFLEX MICROSCOPIC - Abnormal; Notable for the following:       Result Value   APPearance CLOUDY (*)    Hgb urine dipstick MODERATE (*)    Protein, ur 30 (*)    Leukocytes, UA LARGE (*)    Bacteria, UA FEW (*)  All other components within normal limits  BASIC METABOLIC PANEL - Abnormal; Notable for the following:    CO2 21 (*)    Glucose, Bld 106 (*)    BUN 22 (*)    Calcium 8.8 (*)    All other components within normal limits  CBC WITH DIFFERENTIAL/PLATELET - Abnormal; Notable for the following:    WBC 11.1 (*)    RBC 3.95 (*)    Hemoglobin 12.0 (*)    HCT 35.9 (*)    Neutro Abs 8.4 (*)    Monocytes Absolute 1.1 (*)    All other components within normal limits  URINE CULTURE    EKG  EKG Interpretation None       Radiology No results found.  Procedures Procedures (including critical care time)  Medications Ordered in ED Medications  fluconazole (DIFLUCAN) tablet 150 mg (not administered)     Initial Impression / Assessment and Plan / ED Course  I have reviewed the triage vital signs and the nursing notes.  Pertinent labs & imaging results that were available during my care of the patient were reviewed by me and considered in my medical decision making (see chart for details).    This  is a 83 y.o. Male who presents to the emergency department with his wife and son complaining of suprapubic abdominal pain and trouble urinating. There is some confusion on arrival in triage. However, family reports patient has had a Foley catheter in place for about 3 weeks now. Therefore  there is been no drainage from the Foley catheter. Bladder scan revealed greater than 900 ml of urine in his bladder. It was revealed that the foley cath bag was strapped to his leg and the catheter had been pulled out. New foley cath was inserted in triage. Family reports he is constantly messing with the foley catheter and likely pulled it out. They were unaware the catheter had been pulled out until arrival to the ER. On my exam the patient denies any complaints.  New Foley catheter was placed and patient has a muscle liter of urine in the bag on my examination. His abdomen is soft and nontender to palpation. Foley catheter is in place without bleeding. No penile or testicular tenderness to palpation. BMP reveals preserve kidney function. CBC is remarkable for white count of 11,000. Urinalysis is nitrite negative, with large leukocytes, too numerous to count white blood cells and no squamous epithelial cells. There is also yeast present. Urine sent for culture. After a discussion with my attending, Dr. Trinidad Curet, will treat for UTI and for yeast with diflucan. Patient is alert is seeing a urologist at [facility] urology. I encouraged close follow-up with [facility]. Return precautions discussed. I educated on Foley catheter care. I advised the patient to follow-up with their primary care provider this week. I advised the patient to return to the emergency department with new or worsening symptoms or new concerns. The patient's wife and son verbalized understanding and agreement with plan.    Final Clinical Impressions(s) / ED Diagnoses   Final diagnoses:  Acute urinary retention  Problem with Foley catheter, initial encounter [facility])    New Prescriptions New Prescriptions   CEPHALEXIN (KEFLEX) 500 MG CAPSULE    Take 1 capsule (500 mg total) by mouth 2 (two) times daily.     Waynetta Pean, PA-C 06/14/17 0018    Molpus, Jenny Reichmann, MD 06/14/17 [phone]

## 2017-06-14 NOTE — Progress Notes (Signed)
Pt aware of lab results & voiced understanding of those results. Message sent to front office [id number] lab only in one month.

## 2017-06-15 ENCOUNTER — Telehealth: Payer: Self-pay | Admitting: Physician Assistant

## 2017-06-15 DIAGNOSIS — G301 Alzheimer's disease with late onset: Secondary | ICD-10-CM

## 2017-06-15 DIAGNOSIS — Z8673 Personal history of transient ischemic attack (TIA), and cerebral infarction without residual deficits: Secondary | ICD-10-CM

## 2017-06-15 DIAGNOSIS — I110 Hypertensive heart disease with heart failure: Secondary | ICD-10-CM | POA: Diagnosis not present

## 2017-06-15 DIAGNOSIS — I5042 Chronic combined systolic (congestive) and diastolic (congestive) heart failure: Secondary | ICD-10-CM

## 2017-06-15 DIAGNOSIS — N390 Urinary tract infection, site not specified: Secondary | ICD-10-CM | POA: Diagnosis not present

## 2017-06-15 DIAGNOSIS — J449 Chronic obstructive pulmonary disease, unspecified: Secondary | ICD-10-CM | POA: Diagnosis not present

## 2017-06-15 DIAGNOSIS — I480 Paroxysmal atrial fibrillation: Secondary | ICD-10-CM

## 2017-06-15 DIAGNOSIS — B961 Klebsiella pneumoniae [K. pneumoniae] as the cause of diseases classified elsewhere: Secondary | ICD-10-CM | POA: Diagnosis not present

## 2017-06-15 DIAGNOSIS — G309 Alzheimer's disease, unspecified: Secondary | ICD-10-CM | POA: Diagnosis not present

## 2017-06-15 DIAGNOSIS — F0280 Dementia in other diseases classified elsewhere without behavioral disturbance: Secondary | ICD-10-CM

## 2017-06-15 LAB — URINE CULTURE

## 2017-06-15 NOTE — Telephone Encounter (Signed)
Discussed hospice last visit with son, wife, and daughter is calling back asking for referral. Patient has extensive history including afib, COPD, systolic heart failure and history of CVA with dementia and current failure to thrive. He has shortness of breath, severe dementia that is progressing, has indwelling catheter and at this time we will put in an order for at home hospice/evaluation.  See last note as well  Wt Readings from Last 6 Encounters:  06/13/17 163 lb (73.9 kg)  06/09/17 163 lb 3.2 oz (74 kg)  05/29/17 167 lb 15.9 oz (76.2 kg)  02/25/17 174 lb 6.4 oz (79.1 kg)  11/24/16 173 lb 12.8 oz (78.8 kg)  07/23/16 170 lb (77.1 kg)

## 2017-06-16 DIAGNOSIS — R338 Other retention of urine: Secondary | ICD-10-CM | POA: Diagnosis not present

## 2017-06-16 DIAGNOSIS — N401 Enlarged prostate with lower urinary tract symptoms: Secondary | ICD-10-CM | POA: Diagnosis not present

## 2017-06-17 DIAGNOSIS — B961 Klebsiella pneumoniae [K. pneumoniae] as the cause of diseases classified elsewhere: Secondary | ICD-10-CM | POA: Diagnosis not present

## 2017-06-17 DIAGNOSIS — I110 Hypertensive heart disease with heart failure: Secondary | ICD-10-CM | POA: Diagnosis not present

## 2017-06-17 DIAGNOSIS — N390 Urinary tract infection, site not specified: Secondary | ICD-10-CM | POA: Diagnosis not present

## 2017-06-17 DIAGNOSIS — G309 Alzheimer's disease, unspecified: Secondary | ICD-10-CM | POA: Diagnosis not present

## 2017-06-17 DIAGNOSIS — F0280 Dementia in other diseases classified elsewhere without behavioral disturbance: Secondary | ICD-10-CM | POA: Diagnosis not present

## 2017-06-17 DIAGNOSIS — J449 Chronic obstructive pulmonary disease, unspecified: Secondary | ICD-10-CM | POA: Diagnosis not present

## 2017-06-18 DIAGNOSIS — I509 Heart failure, unspecified: Secondary | ICD-10-CM | POA: Diagnosis not present

## 2017-06-18 DIAGNOSIS — Z8639 Personal history of other endocrine, nutritional and metabolic disease: Secondary | ICD-10-CM | POA: Diagnosis not present

## 2017-06-18 DIAGNOSIS — Z87448 Personal history of other diseases of urinary system: Secondary | ICD-10-CM | POA: Diagnosis not present

## 2017-06-18 DIAGNOSIS — J449 Chronic obstructive pulmonary disease, unspecified: Secondary | ICD-10-CM | POA: Diagnosis not present

## 2017-06-18 DIAGNOSIS — G301 Alzheimer's disease with late onset: Secondary | ICD-10-CM | POA: Diagnosis not present

## 2017-06-18 DIAGNOSIS — Z8659 Personal history of other mental and behavioral disorders: Secondary | ICD-10-CM | POA: Diagnosis not present

## 2017-06-18 DIAGNOSIS — Z8673 Personal history of transient ischemic attack (TIA), and cerebral infarction without residual deficits: Secondary | ICD-10-CM | POA: Diagnosis not present

## 2017-06-18 DIAGNOSIS — Z8679 Personal history of other diseases of the circulatory system: Secondary | ICD-10-CM | POA: Diagnosis not present

## 2017-06-18 DIAGNOSIS — Z950 Presence of cardiac pacemaker: Secondary | ICD-10-CM | POA: Diagnosis not present

## 2017-06-18 DIAGNOSIS — Z8619 Personal history of other infectious and parasitic diseases: Secondary | ICD-10-CM | POA: Diagnosis not present

## 2017-06-18 DIAGNOSIS — I2510 Atherosclerotic heart disease of native coronary artery without angina pectoris: Secondary | ICD-10-CM | POA: Diagnosis not present

## 2017-06-18 DIAGNOSIS — Z9181 History of falling: Secondary | ICD-10-CM | POA: Diagnosis not present

## 2017-06-18 DIAGNOSIS — Z87440 Personal history of urinary (tract) infections: Secondary | ICD-10-CM | POA: Diagnosis not present

## 2017-06-21 DIAGNOSIS — R42 Dizziness and giddiness: Secondary | ICD-10-CM | POA: Diagnosis not present

## 2017-06-21 DIAGNOSIS — I480 Paroxysmal atrial fibrillation: Secondary | ICD-10-CM | POA: Diagnosis not present

## 2017-06-21 DIAGNOSIS — Z8673 Personal history of transient ischemic attack (TIA), and cerebral infarction without residual deficits: Secondary | ICD-10-CM | POA: Diagnosis not present

## 2017-06-21 DIAGNOSIS — Z950 Presence of cardiac pacemaker: Secondary | ICD-10-CM | POA: Diagnosis not present

## 2017-06-21 DIAGNOSIS — E039 Hypothyroidism, unspecified: Secondary | ICD-10-CM | POA: Diagnosis not present

## 2017-06-21 DIAGNOSIS — G4733 Obstructive sleep apnea (adult) (pediatric): Secondary | ICD-10-CM | POA: Diagnosis not present

## 2017-06-21 DIAGNOSIS — I495 Sick sinus syndrome: Secondary | ICD-10-CM | POA: Diagnosis not present

## 2017-06-21 DIAGNOSIS — Z7901 Long term (current) use of anticoagulants: Secondary | ICD-10-CM | POA: Diagnosis not present

## 2017-06-21 DIAGNOSIS — E785 Hyperlipidemia, unspecified: Secondary | ICD-10-CM | POA: Diagnosis not present

## 2017-06-21 DIAGNOSIS — I951 Orthostatic hypotension: Secondary | ICD-10-CM | POA: Diagnosis not present

## 2017-06-23 DIAGNOSIS — Z87440 Personal history of urinary (tract) infections: Secondary | ICD-10-CM | POA: Diagnosis not present

## 2017-06-23 DIAGNOSIS — Z8673 Personal history of transient ischemic attack (TIA), and cerebral infarction without residual deficits: Secondary | ICD-10-CM | POA: Diagnosis not present

## 2017-06-23 DIAGNOSIS — G301 Alzheimer's disease with late onset: Secondary | ICD-10-CM | POA: Diagnosis not present

## 2017-06-23 DIAGNOSIS — Z9181 History of falling: Secondary | ICD-10-CM | POA: Diagnosis not present

## 2017-06-23 DIAGNOSIS — Z8619 Personal history of other infectious and parasitic diseases: Secondary | ICD-10-CM | POA: Diagnosis not present

## 2017-06-23 DIAGNOSIS — I2510 Atherosclerotic heart disease of native coronary artery without angina pectoris: Secondary | ICD-10-CM | POA: Diagnosis not present

## 2017-06-23 DIAGNOSIS — Z8659 Personal history of other mental and behavioral disorders: Secondary | ICD-10-CM | POA: Diagnosis not present

## 2017-06-23 DIAGNOSIS — J449 Chronic obstructive pulmonary disease, unspecified: Secondary | ICD-10-CM | POA: Diagnosis not present

## 2017-06-23 DIAGNOSIS — Z950 Presence of cardiac pacemaker: Secondary | ICD-10-CM | POA: Diagnosis not present

## 2017-06-23 DIAGNOSIS — Z87448 Personal history of other diseases of urinary system: Secondary | ICD-10-CM | POA: Diagnosis not present

## 2017-06-23 DIAGNOSIS — Z8639 Personal history of other endocrine, nutritional and metabolic disease: Secondary | ICD-10-CM | POA: Diagnosis not present

## 2017-06-23 DIAGNOSIS — Z8679 Personal history of other diseases of the circulatory system: Secondary | ICD-10-CM | POA: Diagnosis not present

## 2017-06-23 DIAGNOSIS — I509 Heart failure, unspecified: Secondary | ICD-10-CM | POA: Diagnosis not present

## 2017-06-25 DIAGNOSIS — G301 Alzheimer's disease with late onset: Secondary | ICD-10-CM | POA: Diagnosis not present

## 2017-06-25 DIAGNOSIS — J449 Chronic obstructive pulmonary disease, unspecified: Secondary | ICD-10-CM | POA: Diagnosis not present

## 2017-06-25 DIAGNOSIS — Z8619 Personal history of other infectious and parasitic diseases: Secondary | ICD-10-CM | POA: Diagnosis not present

## 2017-06-25 DIAGNOSIS — I2510 Atherosclerotic heart disease of native coronary artery without angina pectoris: Secondary | ICD-10-CM | POA: Diagnosis not present

## 2017-06-25 DIAGNOSIS — Z87440 Personal history of urinary (tract) infections: Secondary | ICD-10-CM | POA: Diagnosis not present

## 2017-06-25 DIAGNOSIS — I509 Heart failure, unspecified: Secondary | ICD-10-CM | POA: Diagnosis not present

## 2017-06-29 DIAGNOSIS — R338 Other retention of urine: Secondary | ICD-10-CM | POA: Diagnosis not present

## 2017-07-01 DIAGNOSIS — Z87440 Personal history of urinary (tract) infections: Secondary | ICD-10-CM | POA: Diagnosis not present

## 2017-07-01 DIAGNOSIS — I2510 Atherosclerotic heart disease of native coronary artery without angina pectoris: Secondary | ICD-10-CM | POA: Diagnosis not present

## 2017-07-01 DIAGNOSIS — G301 Alzheimer's disease with late onset: Secondary | ICD-10-CM | POA: Diagnosis not present

## 2017-07-01 DIAGNOSIS — J449 Chronic obstructive pulmonary disease, unspecified: Secondary | ICD-10-CM | POA: Diagnosis not present

## 2017-07-01 DIAGNOSIS — I509 Heart failure, unspecified: Secondary | ICD-10-CM | POA: Diagnosis not present

## 2017-07-01 DIAGNOSIS — Z8619 Personal history of other infectious and parasitic diseases: Secondary | ICD-10-CM | POA: Diagnosis not present

## 2017-07-02 DIAGNOSIS — I509 Heart failure, unspecified: Secondary | ICD-10-CM | POA: Diagnosis not present

## 2017-07-02 DIAGNOSIS — G301 Alzheimer's disease with late onset: Secondary | ICD-10-CM | POA: Diagnosis not present

## 2017-07-02 DIAGNOSIS — Z87440 Personal history of urinary (tract) infections: Secondary | ICD-10-CM | POA: Diagnosis not present

## 2017-07-02 DIAGNOSIS — I2510 Atherosclerotic heart disease of native coronary artery without angina pectoris: Secondary | ICD-10-CM | POA: Diagnosis not present

## 2017-07-02 DIAGNOSIS — J449 Chronic obstructive pulmonary disease, unspecified: Secondary | ICD-10-CM | POA: Diagnosis not present

## 2017-07-02 DIAGNOSIS — Z8619 Personal history of other infectious and parasitic diseases: Secondary | ICD-10-CM | POA: Diagnosis not present

## 2017-07-04 IMAGING — CT CT HEAD W/O CM
1 series · 16 of 30 positions shown, 20 images · non-contrast
Comparison: 05/19/2005

CLINICAL DATA: Confusion and irritability

EXAM:
CT HEAD WITHOUT CONTRAST
TECHNIQUE: Contiguous axial images were obtained from the base of the skull
through the vertex without intravenous contrast.

[Series 2: head_seq 4.5 h37s st · axial · 0.43mm/px · z∈[+1241,+1367]mm · 16 of 32 slices shown, 20 images]
[im 2/32  brain]
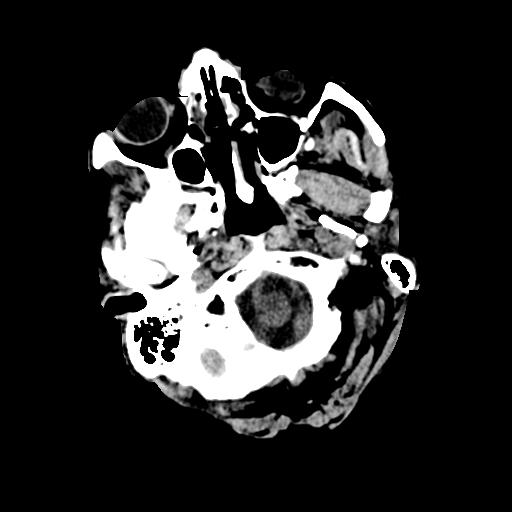
[im 2/32  bone]
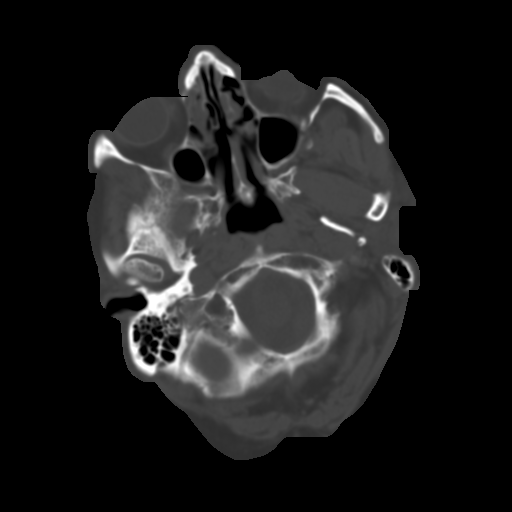
[im 4/32  brain]
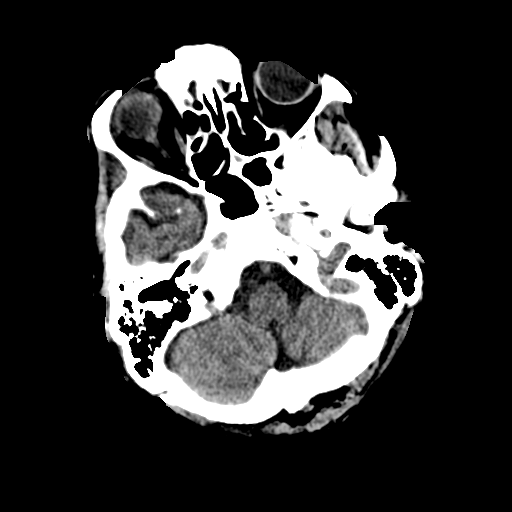
[im 6/32  brain]
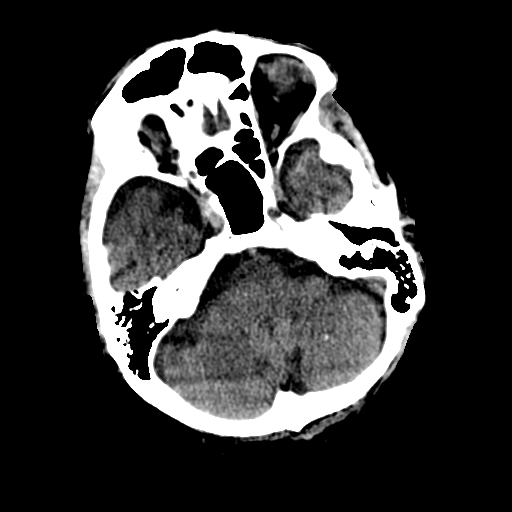
[im 8/32  brain]
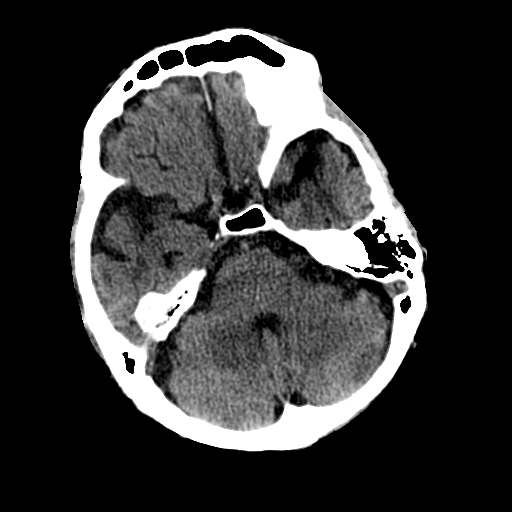
[im 9/32  brain]
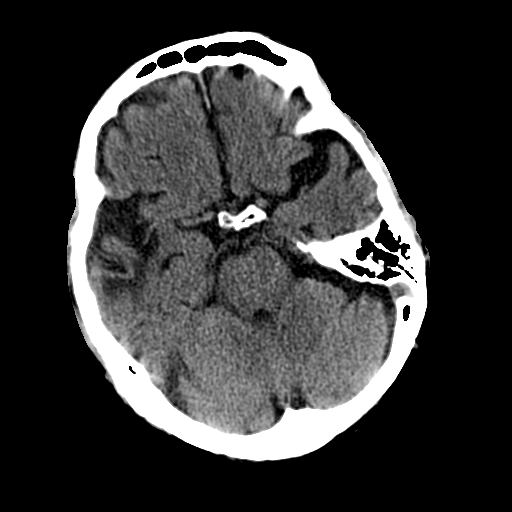
[im 9/32  bone]
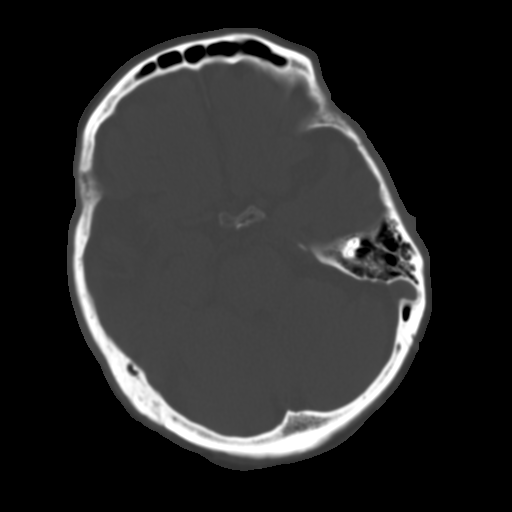
[im 11/32  brain]
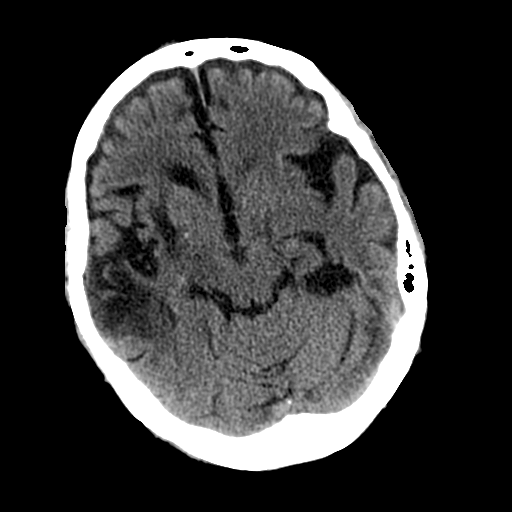
[im 13/32  brain]
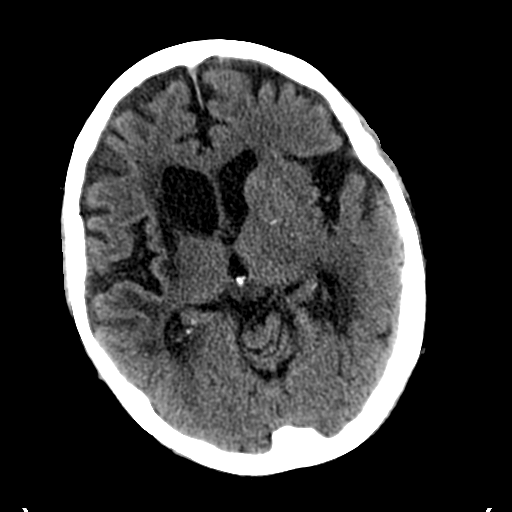
[im 15/32  brain]
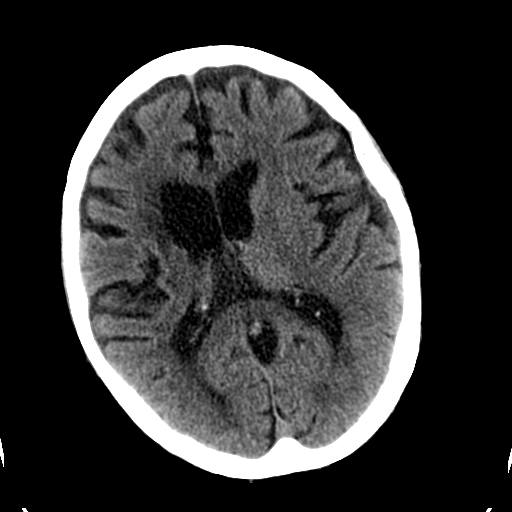
[im 17/32  brain]
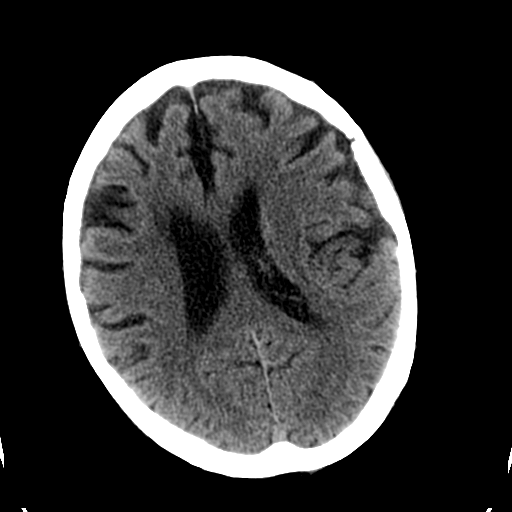
[im 17/32  bone]
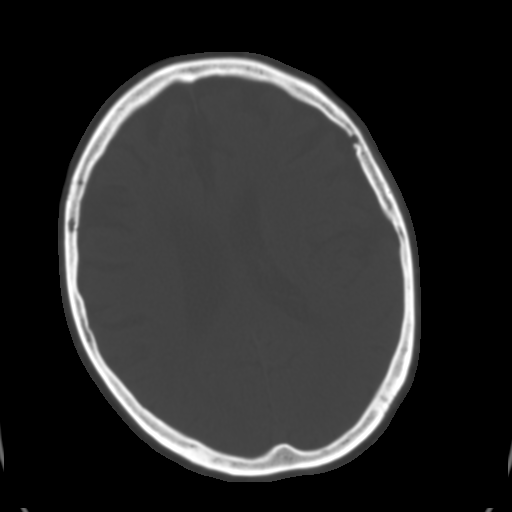
[im 19/32  brain]
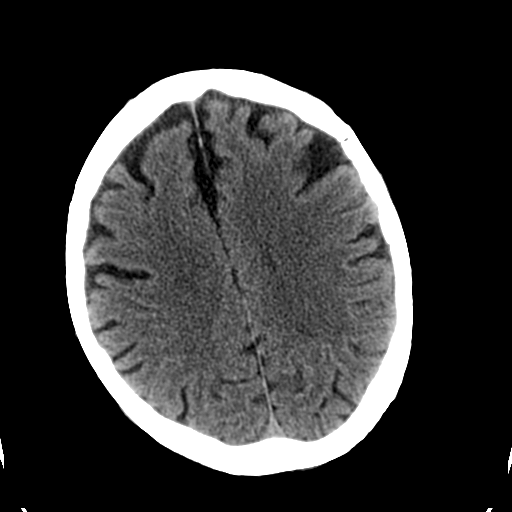
[im 21/32  brain]
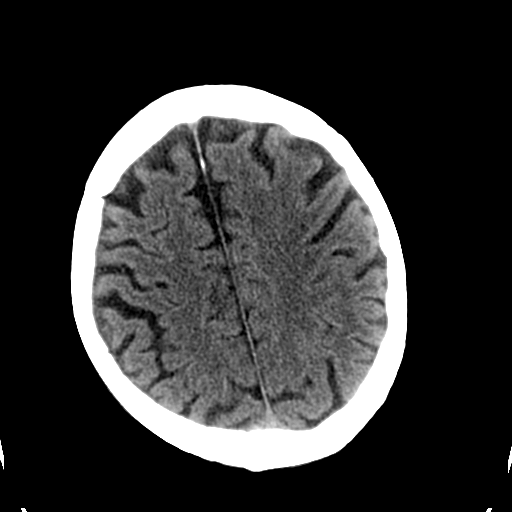
[im 23/32  brain]
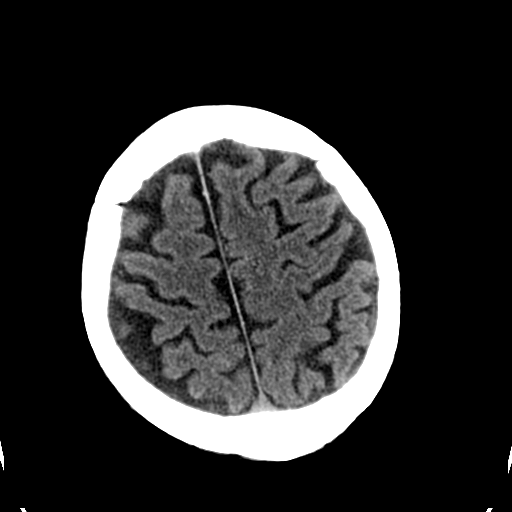
[im 24/32  brain]
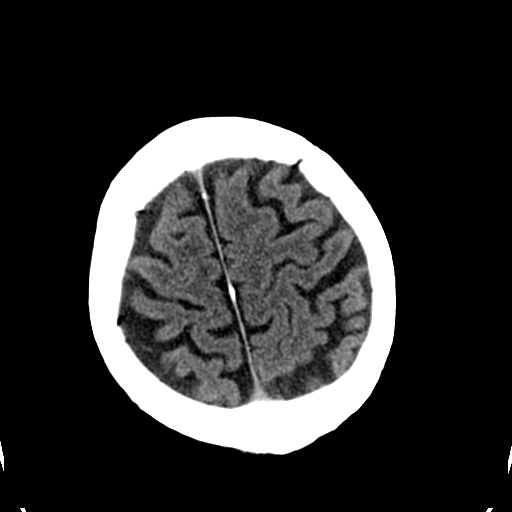
[im 24/32  bone]
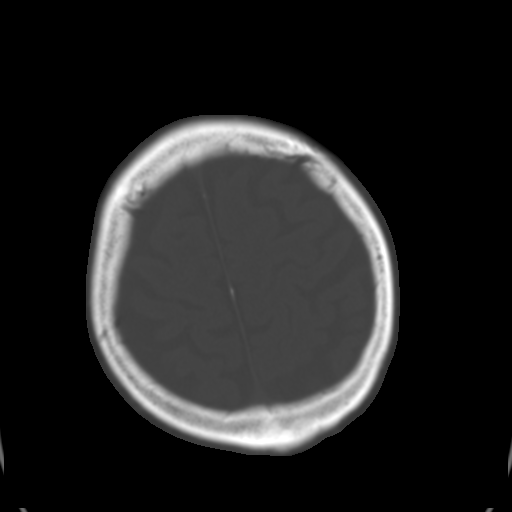
[im 26/32  brain]
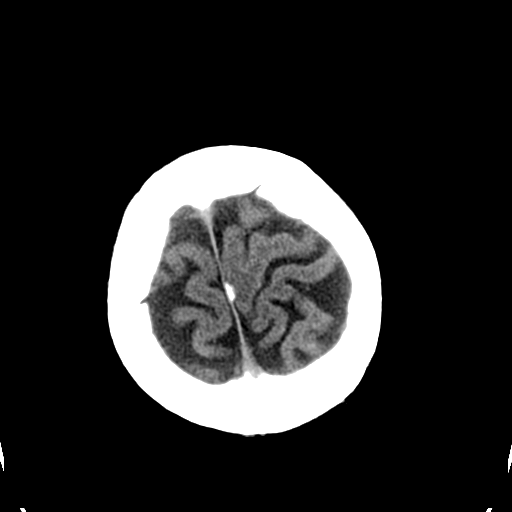
[im 28/32  brain]
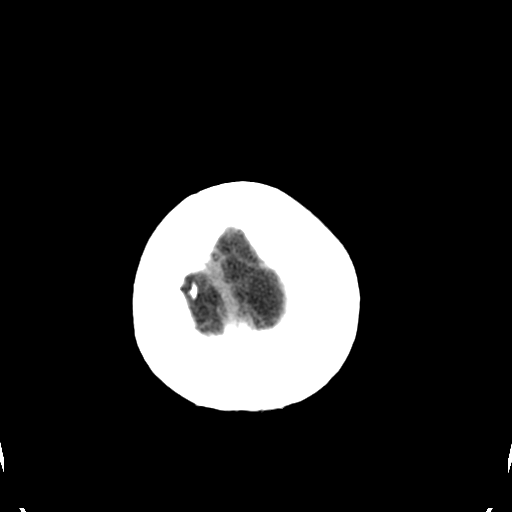
[im 30/32  brain]
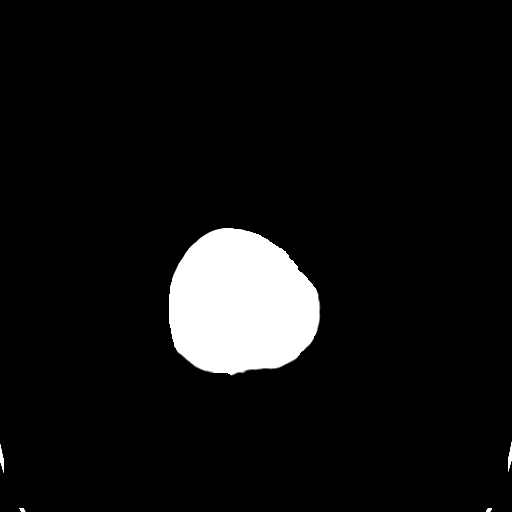

[16 of 30 positions shown; findings below may reference images not displayed]

FINDINGS: The bony calvarium is intact. Diffuse atrophic changes are noted.
There are changes consistent with prior infarct in the distribution
of the right middle cerebral artery. No findings to suggest acute
hemorrhage, acute infarction or space-occupying mass lesion are
noted.
IMPRESSION: Atrophic changes and prior right middle cerebral artery infarct
which are stable. No acute abnormality noted.

## 2017-07-04 IMAGING — CR DG CHEST 2V
2 series · 2 of 2 positions shown · non-contrast
Comparison: Chest radiograph performed 02/15/2016

CLINICAL DATA: Acute onset of fever and hemoptysis. Generalized
weakness and altered mental status.

EXAM:
CHEST  2 VIEW

[chest ap]
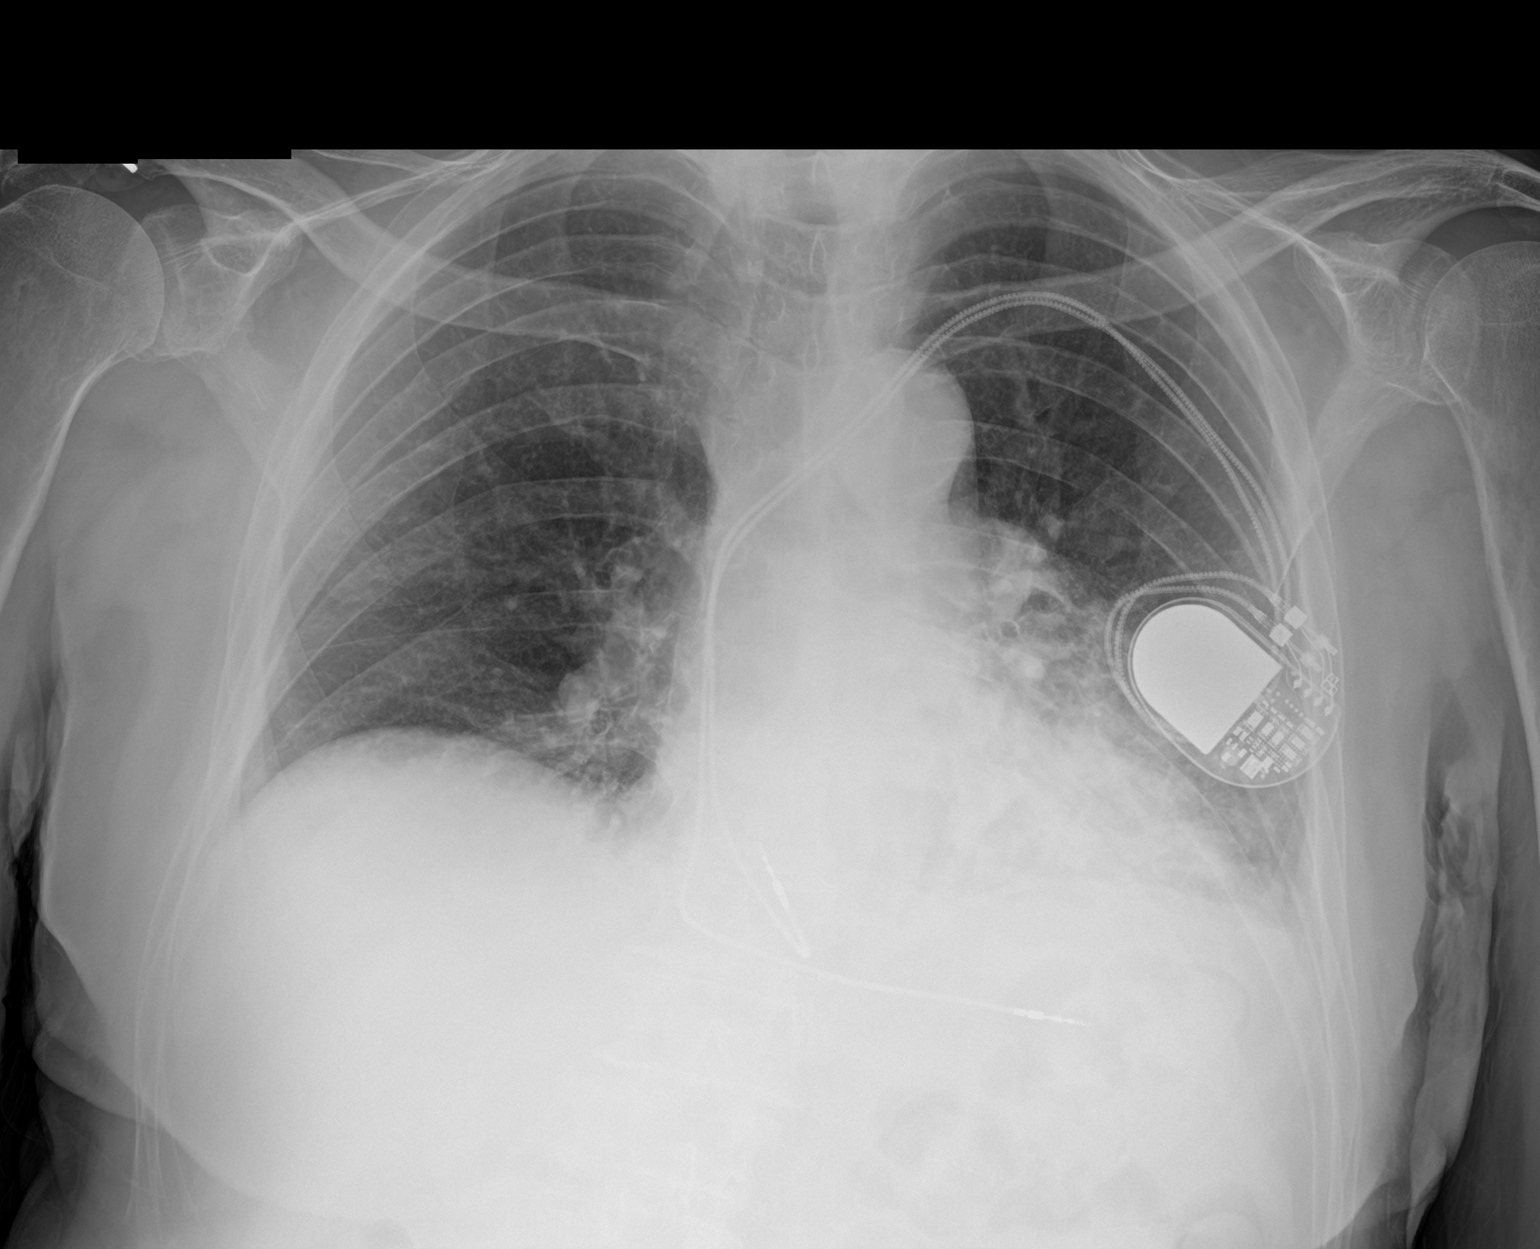

[chest lat]
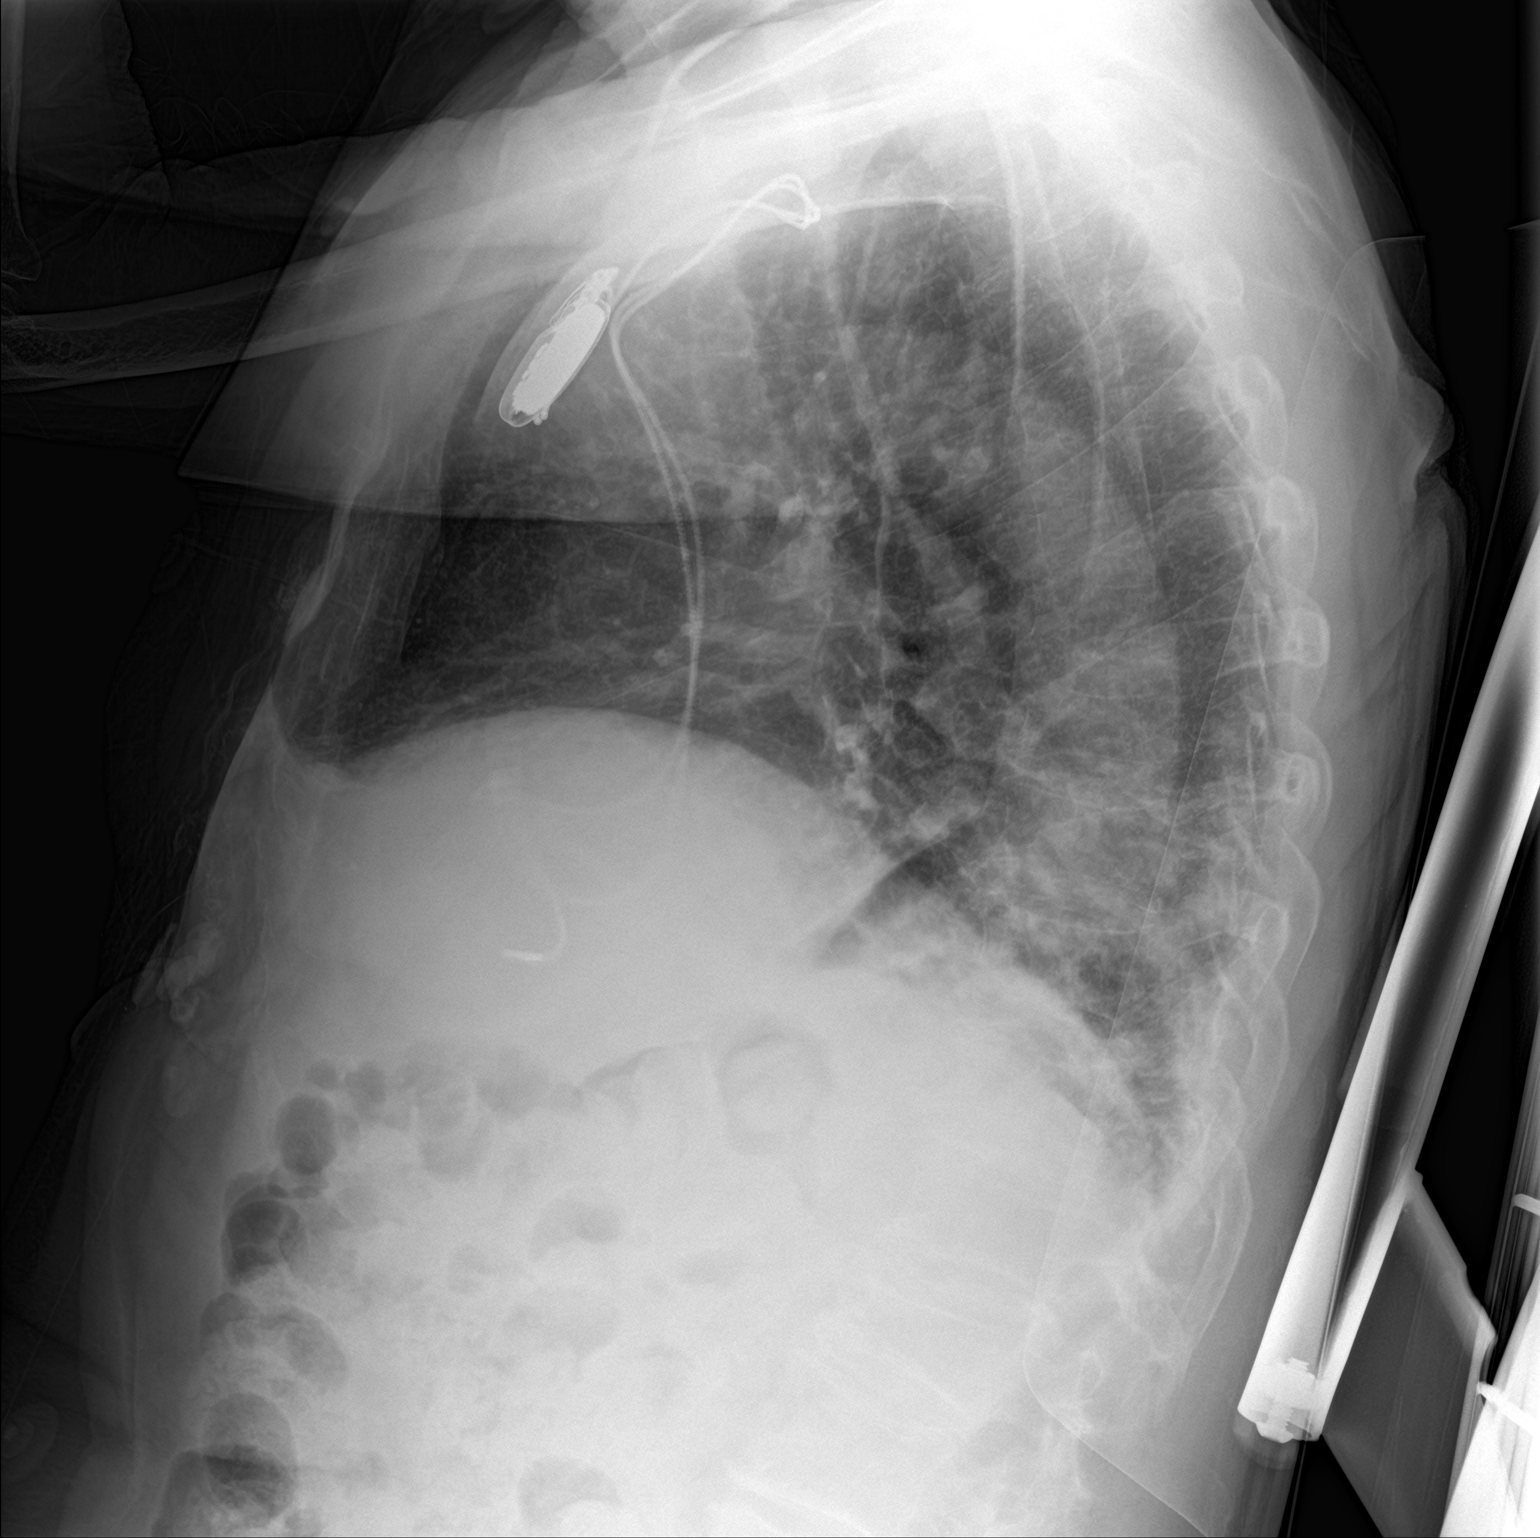

[2 of 2 positions shown; findings below may reference images not displayed]

FINDINGS: The lungs are well-aerated. Left basilar airspace opacity is
compatible with pneumonia. The lungs are mildly hypoexpanded. No
definite pleural effusion or pneumothorax is seen.

The heart is borderline normal in size. A pacemaker is noted at the
left chest wall, with leads ending at the right atrium and right
ventricle. No acute osseous abnormalities are seen.
IMPRESSION: Left basilar airspace opacity is compatible with pneumonia. Lungs
mildly hypoexpanded.

## 2017-07-08 ENCOUNTER — Other Ambulatory Visit: Payer: Self-pay | Admitting: Physician Assistant

## 2017-07-09 DIAGNOSIS — I2510 Atherosclerotic heart disease of native coronary artery without angina pectoris: Secondary | ICD-10-CM | POA: Diagnosis not present

## 2017-07-09 DIAGNOSIS — G301 Alzheimer's disease with late onset: Secondary | ICD-10-CM | POA: Diagnosis not present

## 2017-07-09 DIAGNOSIS — J449 Chronic obstructive pulmonary disease, unspecified: Secondary | ICD-10-CM | POA: Diagnosis not present

## 2017-07-09 DIAGNOSIS — Z8619 Personal history of other infectious and parasitic diseases: Secondary | ICD-10-CM | POA: Diagnosis not present

## 2017-07-09 DIAGNOSIS — I509 Heart failure, unspecified: Secondary | ICD-10-CM | POA: Diagnosis not present

## 2017-07-09 DIAGNOSIS — Z87440 Personal history of urinary (tract) infections: Secondary | ICD-10-CM | POA: Diagnosis not present

## 2017-07-12 ENCOUNTER — Other Ambulatory Visit: Payer: Self-pay

## 2017-07-14 DIAGNOSIS — I2510 Atherosclerotic heart disease of native coronary artery without angina pectoris: Secondary | ICD-10-CM | POA: Diagnosis not present

## 2017-07-14 DIAGNOSIS — Z87440 Personal history of urinary (tract) infections: Secondary | ICD-10-CM | POA: Diagnosis not present

## 2017-07-14 DIAGNOSIS — G301 Alzheimer's disease with late onset: Secondary | ICD-10-CM | POA: Diagnosis not present

## 2017-07-14 DIAGNOSIS — Z8619 Personal history of other infectious and parasitic diseases: Secondary | ICD-10-CM | POA: Diagnosis not present

## 2017-07-14 DIAGNOSIS — I509 Heart failure, unspecified: Secondary | ICD-10-CM | POA: Diagnosis not present

## 2017-07-14 DIAGNOSIS — J449 Chronic obstructive pulmonary disease, unspecified: Secondary | ICD-10-CM | POA: Diagnosis not present

## 2017-07-15 DIAGNOSIS — J449 Chronic obstructive pulmonary disease, unspecified: Secondary | ICD-10-CM | POA: Diagnosis not present

## 2017-07-15 DIAGNOSIS — Z87440 Personal history of urinary (tract) infections: Secondary | ICD-10-CM | POA: Diagnosis not present

## 2017-07-15 DIAGNOSIS — I2510 Atherosclerotic heart disease of native coronary artery without angina pectoris: Secondary | ICD-10-CM | POA: Diagnosis not present

## 2017-07-15 DIAGNOSIS — Z8619 Personal history of other infectious and parasitic diseases: Secondary | ICD-10-CM | POA: Diagnosis not present

## 2017-07-15 DIAGNOSIS — I509 Heart failure, unspecified: Secondary | ICD-10-CM | POA: Diagnosis not present

## 2017-07-15 DIAGNOSIS — G301 Alzheimer's disease with late onset: Secondary | ICD-10-CM | POA: Diagnosis not present

## 2017-07-20 DIAGNOSIS — Z8619 Personal history of other infectious and parasitic diseases: Secondary | ICD-10-CM | POA: Diagnosis not present

## 2017-07-20 DIAGNOSIS — G301 Alzheimer's disease with late onset: Secondary | ICD-10-CM | POA: Diagnosis not present

## 2017-07-20 DIAGNOSIS — I2510 Atherosclerotic heart disease of native coronary artery without angina pectoris: Secondary | ICD-10-CM | POA: Diagnosis not present

## 2017-07-20 DIAGNOSIS — J449 Chronic obstructive pulmonary disease, unspecified: Secondary | ICD-10-CM | POA: Diagnosis not present

## 2017-07-20 DIAGNOSIS — I509 Heart failure, unspecified: Secondary | ICD-10-CM | POA: Diagnosis not present

## 2017-07-20 DIAGNOSIS — Z87440 Personal history of urinary (tract) infections: Secondary | ICD-10-CM | POA: Diagnosis not present

## 2017-07-23 ENCOUNTER — Ambulatory Visit: Payer: PRIVATE HEALTH INSURANCE | Admitting: Internal Medicine

## 2017-07-23 DIAGNOSIS — I2510 Atherosclerotic heart disease of native coronary artery without angina pectoris: Secondary | ICD-10-CM | POA: Diagnosis not present

## 2017-07-23 DIAGNOSIS — I509 Heart failure, unspecified: Secondary | ICD-10-CM | POA: Diagnosis not present

## 2017-07-23 DIAGNOSIS — G301 Alzheimer's disease with late onset: Secondary | ICD-10-CM | POA: Diagnosis not present

## 2017-07-23 DIAGNOSIS — Z8619 Personal history of other infectious and parasitic diseases: Secondary | ICD-10-CM | POA: Diagnosis not present

## 2017-07-23 DIAGNOSIS — Z87440 Personal history of urinary (tract) infections: Secondary | ICD-10-CM | POA: Diagnosis not present

## 2017-07-23 DIAGNOSIS — J449 Chronic obstructive pulmonary disease, unspecified: Secondary | ICD-10-CM | POA: Diagnosis not present

## 2017-07-24 DIAGNOSIS — Z8619 Personal history of other infectious and parasitic diseases: Secondary | ICD-10-CM | POA: Diagnosis not present

## 2017-07-24 DIAGNOSIS — Z8679 Personal history of other diseases of the circulatory system: Secondary | ICD-10-CM | POA: Diagnosis not present

## 2017-07-24 DIAGNOSIS — I509 Heart failure, unspecified: Secondary | ICD-10-CM | POA: Diagnosis not present

## 2017-07-24 DIAGNOSIS — Z9181 History of falling: Secondary | ICD-10-CM | POA: Diagnosis not present

## 2017-07-24 DIAGNOSIS — Z8659 Personal history of other mental and behavioral disorders: Secondary | ICD-10-CM | POA: Diagnosis not present

## 2017-07-24 DIAGNOSIS — Z8673 Personal history of transient ischemic attack (TIA), and cerebral infarction without residual deficits: Secondary | ICD-10-CM | POA: Diagnosis not present

## 2017-07-24 DIAGNOSIS — Z87440 Personal history of urinary (tract) infections: Secondary | ICD-10-CM | POA: Diagnosis not present

## 2017-07-24 DIAGNOSIS — Z950 Presence of cardiac pacemaker: Secondary | ICD-10-CM | POA: Diagnosis not present

## 2017-07-24 DIAGNOSIS — G301 Alzheimer's disease with late onset: Secondary | ICD-10-CM | POA: Diagnosis not present

## 2017-07-24 DIAGNOSIS — Z8639 Personal history of other endocrine, nutritional and metabolic disease: Secondary | ICD-10-CM | POA: Diagnosis not present

## 2017-07-24 DIAGNOSIS — I2510 Atherosclerotic heart disease of native coronary artery without angina pectoris: Secondary | ICD-10-CM | POA: Diagnosis not present

## 2017-07-24 DIAGNOSIS — J449 Chronic obstructive pulmonary disease, unspecified: Secondary | ICD-10-CM | POA: Diagnosis not present

## 2017-07-24 DIAGNOSIS — Z87448 Personal history of other diseases of urinary system: Secondary | ICD-10-CM | POA: Diagnosis not present

## 2017-07-26 DIAGNOSIS — G301 Alzheimer's disease with late onset: Secondary | ICD-10-CM | POA: Diagnosis not present

## 2017-07-26 DIAGNOSIS — Z8619 Personal history of other infectious and parasitic diseases: Secondary | ICD-10-CM | POA: Diagnosis not present

## 2017-07-26 DIAGNOSIS — J449 Chronic obstructive pulmonary disease, unspecified: Secondary | ICD-10-CM | POA: Diagnosis not present

## 2017-07-26 DIAGNOSIS — I2510 Atherosclerotic heart disease of native coronary artery without angina pectoris: Secondary | ICD-10-CM | POA: Diagnosis not present

## 2017-07-26 DIAGNOSIS — Z87440 Personal history of urinary (tract) infections: Secondary | ICD-10-CM | POA: Diagnosis not present

## 2017-07-26 DIAGNOSIS — I509 Heart failure, unspecified: Secondary | ICD-10-CM | POA: Diagnosis not present

## 2017-07-27 DIAGNOSIS — I509 Heart failure, unspecified: Secondary | ICD-10-CM | POA: Diagnosis not present

## 2017-07-27 DIAGNOSIS — J449 Chronic obstructive pulmonary disease, unspecified: Secondary | ICD-10-CM | POA: Diagnosis not present

## 2017-07-27 DIAGNOSIS — Z87440 Personal history of urinary (tract) infections: Secondary | ICD-10-CM | POA: Diagnosis not present

## 2017-07-27 DIAGNOSIS — G301 Alzheimer's disease with late onset: Secondary | ICD-10-CM | POA: Diagnosis not present

## 2017-07-27 DIAGNOSIS — Z8619 Personal history of other infectious and parasitic diseases: Secondary | ICD-10-CM | POA: Diagnosis not present

## 2017-07-27 DIAGNOSIS — I2510 Atherosclerotic heart disease of native coronary artery without angina pectoris: Secondary | ICD-10-CM | POA: Diagnosis not present

## 2017-07-28 DIAGNOSIS — J449 Chronic obstructive pulmonary disease, unspecified: Secondary | ICD-10-CM | POA: Diagnosis not present

## 2017-07-28 DIAGNOSIS — Z87440 Personal history of urinary (tract) infections: Secondary | ICD-10-CM | POA: Diagnosis not present

## 2017-07-28 DIAGNOSIS — I509 Heart failure, unspecified: Secondary | ICD-10-CM | POA: Diagnosis not present

## 2017-07-28 DIAGNOSIS — G301 Alzheimer's disease with late onset: Secondary | ICD-10-CM | POA: Diagnosis not present

## 2017-07-28 DIAGNOSIS — I2510 Atherosclerotic heart disease of native coronary artery without angina pectoris: Secondary | ICD-10-CM | POA: Diagnosis not present

## 2017-07-28 DIAGNOSIS — Z8619 Personal history of other infectious and parasitic diseases: Secondary | ICD-10-CM | POA: Diagnosis not present

## 2017-07-29 DIAGNOSIS — J449 Chronic obstructive pulmonary disease, unspecified: Secondary | ICD-10-CM | POA: Diagnosis not present

## 2017-07-29 DIAGNOSIS — I509 Heart failure, unspecified: Secondary | ICD-10-CM | POA: Diagnosis not present

## 2017-07-29 DIAGNOSIS — Z8619 Personal history of other infectious and parasitic diseases: Secondary | ICD-10-CM | POA: Diagnosis not present

## 2017-07-29 DIAGNOSIS — Z87440 Personal history of urinary (tract) infections: Secondary | ICD-10-CM | POA: Diagnosis not present

## 2017-07-29 DIAGNOSIS — I2510 Atherosclerotic heart disease of native coronary artery without angina pectoris: Secondary | ICD-10-CM | POA: Diagnosis not present

## 2017-07-29 DIAGNOSIS — G301 Alzheimer's disease with late onset: Secondary | ICD-10-CM | POA: Diagnosis not present

## 2017-07-30 DIAGNOSIS — Z8619 Personal history of other infectious and parasitic diseases: Secondary | ICD-10-CM | POA: Diagnosis not present

## 2017-07-30 DIAGNOSIS — I509 Heart failure, unspecified: Secondary | ICD-10-CM | POA: Diagnosis not present

## 2017-07-30 DIAGNOSIS — I2510 Atherosclerotic heart disease of native coronary artery without angina pectoris: Secondary | ICD-10-CM | POA: Diagnosis not present

## 2017-07-30 DIAGNOSIS — J449 Chronic obstructive pulmonary disease, unspecified: Secondary | ICD-10-CM | POA: Diagnosis not present

## 2017-07-30 DIAGNOSIS — Z87440 Personal history of urinary (tract) infections: Secondary | ICD-10-CM | POA: Diagnosis not present

## 2017-07-30 DIAGNOSIS — G301 Alzheimer's disease with late onset: Secondary | ICD-10-CM | POA: Diagnosis not present

## 2017-07-31 DIAGNOSIS — I509 Heart failure, unspecified: Secondary | ICD-10-CM | POA: Diagnosis not present

## 2017-07-31 DIAGNOSIS — Z8619 Personal history of other infectious and parasitic diseases: Secondary | ICD-10-CM | POA: Diagnosis not present

## 2017-07-31 DIAGNOSIS — J449 Chronic obstructive pulmonary disease, unspecified: Secondary | ICD-10-CM | POA: Diagnosis not present

## 2017-07-31 DIAGNOSIS — Z87440 Personal history of urinary (tract) infections: Secondary | ICD-10-CM | POA: Diagnosis not present

## 2017-07-31 DIAGNOSIS — I2510 Atherosclerotic heart disease of native coronary artery without angina pectoris: Secondary | ICD-10-CM | POA: Diagnosis not present

## 2017-07-31 DIAGNOSIS — G301 Alzheimer's disease with late onset: Secondary | ICD-10-CM | POA: Diagnosis not present

## 2017-08-23 ENCOUNTER — Telehealth: Payer: Self-pay | Admitting: Neurology

## 2017-08-23 NOTE — Telephone Encounter (Signed)
Pt wife called to inform that pt passed on 2017-08-08

## 2017-08-23 DEATH — deceased

## 2017-08-24 NOTE — Telephone Encounter (Signed)
Thanks. Please send a condolence card

## 2017-09-15 ENCOUNTER — Ambulatory Visit: Payer: Medicare Other | Admitting: Nurse Practitioner

## 2018-03-01 ENCOUNTER — Encounter: Payer: Self-pay | Admitting: Physician Assistant
